# Patient Record
Sex: Female | Born: 1965 | Race: White | Hispanic: No | Smoking: Never smoker
Health system: Southern US, Community
[De-identification: ages and names within clinical notes are randomized; demographics above are authoritative.]

## PROBLEM LIST (undated history)

## (undated) DIAGNOSIS — Z92241 Personal history of systemic steroid therapy: Secondary | ICD-10-CM

## (undated) DIAGNOSIS — F32A Depression, unspecified: Secondary | ICD-10-CM

## (undated) DIAGNOSIS — M25559 Pain in unspecified hip: Secondary | ICD-10-CM

## (undated) DIAGNOSIS — D649 Anemia, unspecified: Secondary | ICD-10-CM

## (undated) DIAGNOSIS — M79606 Pain in leg, unspecified: Secondary | ICD-10-CM

## (undated) DIAGNOSIS — T7840XA Allergy, unspecified, initial encounter: Secondary | ICD-10-CM

## (undated) DIAGNOSIS — F3281 Premenstrual dysphoric disorder: Secondary | ICD-10-CM

## (undated) DIAGNOSIS — F329 Major depressive disorder, single episode, unspecified: Secondary | ICD-10-CM

## (undated) DIAGNOSIS — G2581 Restless legs syndrome: Secondary | ICD-10-CM

## (undated) DIAGNOSIS — G43909 Migraine, unspecified, not intractable, without status migrainosus: Secondary | ICD-10-CM

## (undated) DIAGNOSIS — G8929 Other chronic pain: Secondary | ICD-10-CM

## (undated) HISTORY — DX: Allergy, unspecified, initial encounter: T78.40XA

## (undated) HISTORY — DX: Pain in unspecified hip: M25.559

## (undated) HISTORY — DX: Restless legs syndrome: G25.81

## (undated) HISTORY — DX: Personal history of systemic steroid therapy: Z92.241

## (undated) HISTORY — DX: Depression, unspecified: F32.A

## (undated) HISTORY — DX: Other chronic pain: G89.29

## (undated) HISTORY — DX: Migraine, unspecified, not intractable, without status migrainosus: G43.909

## (undated) HISTORY — DX: Pain in leg, unspecified: M79.606

## (undated) HISTORY — DX: Anemia, unspecified: D64.9

## (undated) HISTORY — DX: Major depressive disorder, single episode, unspecified: F32.9

## (undated) HISTORY — DX: Premenstrual dysphoric disorder: F32.81

## (undated) HISTORY — PX: CERVICAL CERCLAGE: SHX1329

---

## 1997-12-30 ENCOUNTER — Inpatient Hospital Stay (HOSPITAL_COMMUNITY): Admission: AD | Admit: 1997-12-30 | Discharge: 1998-01-01 | Payer: Self-pay | Admitting: Obstetrics and Gynecology

## 1998-01-02 ENCOUNTER — Inpatient Hospital Stay (HOSPITAL_COMMUNITY): Admission: AD | Admit: 1998-01-02 | Discharge: 1998-01-02 | Payer: Self-pay | Admitting: Obstetrics & Gynecology

## 1998-12-28 ENCOUNTER — Ambulatory Visit (HOSPITAL_COMMUNITY): Admission: AD | Admit: 1998-12-28 | Discharge: 1998-12-28 | Payer: Self-pay | Admitting: Physician Assistant

## 1998-12-28 ENCOUNTER — Encounter: Payer: Self-pay | Admitting: Obstetrics and Gynecology

## 2000-02-22 ENCOUNTER — Inpatient Hospital Stay (HOSPITAL_COMMUNITY): Admission: AD | Admit: 2000-02-22 | Discharge: 2000-02-22 | Payer: Self-pay | Admitting: Obstetrics and Gynecology

## 2000-02-25 ENCOUNTER — Inpatient Hospital Stay (HOSPITAL_COMMUNITY): Admission: AD | Admit: 2000-02-25 | Discharge: 2000-02-25 | Payer: Self-pay | Admitting: *Deleted

## 2000-02-26 ENCOUNTER — Encounter: Payer: Self-pay | Admitting: Obstetrics and Gynecology

## 2000-03-01 ENCOUNTER — Inpatient Hospital Stay (HOSPITAL_COMMUNITY): Admission: AD | Admit: 2000-03-01 | Discharge: 2000-03-01 | Payer: Self-pay | Admitting: Obstetrics and Gynecology

## 2000-04-06 ENCOUNTER — Encounter: Payer: Self-pay | Admitting: Obstetrics and Gynecology

## 2000-04-06 ENCOUNTER — Inpatient Hospital Stay (HOSPITAL_COMMUNITY): Admission: RE | Admit: 2000-04-06 | Discharge: 2000-04-10 | Payer: Self-pay | Admitting: Obstetrics and Gynecology

## 2000-04-07 ENCOUNTER — Encounter: Payer: Self-pay | Admitting: Obstetrics and Gynecology

## 2000-04-08 ENCOUNTER — Encounter: Payer: Self-pay | Admitting: Obstetrics and Gynecology

## 2000-05-04 ENCOUNTER — Encounter (INDEPENDENT_AMBULATORY_CARE_PROVIDER_SITE_OTHER): Payer: Self-pay

## 2000-05-04 ENCOUNTER — Observation Stay (HOSPITAL_COMMUNITY): Admission: AD | Admit: 2000-05-04 | Discharge: 2000-05-05 | Payer: Self-pay | Admitting: Obstetrics and Gynecology

## 2000-05-04 ENCOUNTER — Encounter: Payer: Self-pay | Admitting: Obstetrics and Gynecology

## 2001-09-03 ENCOUNTER — Other Ambulatory Visit: Admission: RE | Admit: 2001-09-03 | Discharge: 2001-09-03 | Payer: Self-pay | Admitting: Gynecology

## 2002-01-31 ENCOUNTER — Emergency Department (HOSPITAL_COMMUNITY): Admission: EM | Admit: 2002-01-31 | Discharge: 2002-01-31 | Payer: Self-pay | Admitting: *Deleted

## 2002-10-20 ENCOUNTER — Other Ambulatory Visit: Admission: RE | Admit: 2002-10-20 | Discharge: 2002-10-20 | Payer: Self-pay | Admitting: Gynecology

## 2003-10-29 ENCOUNTER — Ambulatory Visit (HOSPITAL_COMMUNITY): Admission: RE | Admit: 2003-10-29 | Discharge: 2003-10-29 | Payer: Self-pay | Admitting: Gynecology

## 2003-10-29 ENCOUNTER — Encounter (INDEPENDENT_AMBULATORY_CARE_PROVIDER_SITE_OTHER): Payer: Self-pay | Admitting: *Deleted

## 2004-03-18 ENCOUNTER — Encounter (INDEPENDENT_AMBULATORY_CARE_PROVIDER_SITE_OTHER): Payer: Self-pay | Admitting: Specialist

## 2004-03-18 ENCOUNTER — Ambulatory Visit (HOSPITAL_COMMUNITY): Admission: AD | Admit: 2004-03-18 | Discharge: 2004-03-18 | Payer: Self-pay | Admitting: Gynecology

## 2004-10-22 ENCOUNTER — Emergency Department (HOSPITAL_COMMUNITY): Admission: EM | Admit: 2004-10-22 | Discharge: 2004-10-22 | Payer: Self-pay | Admitting: Emergency Medicine

## 2004-12-05 ENCOUNTER — Other Ambulatory Visit: Admission: RE | Admit: 2004-12-05 | Discharge: 2004-12-05 | Payer: Self-pay | Admitting: Gynecology

## 2005-01-02 LAB — MEASLES/MUMPS/RUBELLA IMMUNITY
MUMPS IGG: 2.54
RUBELLA: 60.1
RUBEOLA AB, IGG: 1.21

## 2005-01-02 LAB — MUMPS ANTIBODY, IGG: Mumps IgG: 2.54

## 2005-06-28 ENCOUNTER — Emergency Department (HOSPITAL_COMMUNITY): Admission: EM | Admit: 2005-06-28 | Discharge: 2005-06-28 | Payer: Self-pay | Admitting: Emergency Medicine

## 2006-01-03 ENCOUNTER — Other Ambulatory Visit: Admission: RE | Admit: 2006-01-03 | Discharge: 2006-01-03 | Payer: Self-pay | Admitting: Gynecology

## 2007-07-10 ENCOUNTER — Encounter: Admission: RE | Admit: 2007-07-10 | Discharge: 2007-07-10 | Payer: Self-pay | Admitting: Gynecology

## 2007-07-25 ENCOUNTER — Encounter: Admission: RE | Admit: 2007-07-25 | Discharge: 2007-07-25 | Payer: Self-pay | Admitting: Gynecology

## 2007-07-25 ENCOUNTER — Other Ambulatory Visit: Admission: RE | Admit: 2007-07-25 | Discharge: 2007-07-25 | Payer: Self-pay | Admitting: Gynecology

## 2008-07-27 ENCOUNTER — Other Ambulatory Visit: Admission: RE | Admit: 2008-07-27 | Discharge: 2008-07-27 | Payer: Self-pay | Admitting: Gynecology

## 2008-08-18 ENCOUNTER — Emergency Department (HOSPITAL_COMMUNITY): Admission: EM | Admit: 2008-08-18 | Discharge: 2008-08-18 | Payer: Self-pay | Admitting: Emergency Medicine

## 2008-09-02 ENCOUNTER — Encounter: Admission: RE | Admit: 2008-09-02 | Discharge: 2008-09-02 | Payer: Self-pay | Admitting: Gynecology

## 2009-12-15 ENCOUNTER — Ambulatory Visit: Payer: Self-pay | Admitting: Gynecology

## 2009-12-15 ENCOUNTER — Other Ambulatory Visit: Admission: RE | Admit: 2009-12-15 | Discharge: 2009-12-15 | Payer: Self-pay | Admitting: Gynecology

## 2009-12-20 ENCOUNTER — Encounter: Admission: RE | Admit: 2009-12-20 | Discharge: 2009-12-20 | Payer: Self-pay | Admitting: Gynecology

## 2010-12-19 ENCOUNTER — Encounter: Payer: Self-pay | Admitting: Gynecology

## 2011-02-16 ENCOUNTER — Other Ambulatory Visit: Payer: Self-pay | Admitting: Gynecology

## 2011-02-16 ENCOUNTER — Encounter (INDEPENDENT_AMBULATORY_CARE_PROVIDER_SITE_OTHER): Payer: Federal, State, Local not specified - PPO | Admitting: Gynecology

## 2011-02-16 ENCOUNTER — Other Ambulatory Visit (HOSPITAL_COMMUNITY)
Admission: RE | Admit: 2011-02-16 | Discharge: 2011-02-16 | Disposition: A | Discharge status: Home / Self Care | Payer: Federal, State, Local not specified - PPO | Source: Ambulatory Visit | Attending: Gynecology | Admitting: Gynecology

## 2011-02-16 DIAGNOSIS — Z833 Family history of diabetes mellitus: Secondary | ICD-10-CM

## 2011-02-16 DIAGNOSIS — Z124 Encounter for screening for malignant neoplasm of cervix: Secondary | ICD-10-CM | POA: Insufficient documentation

## 2011-02-16 DIAGNOSIS — Z01419 Encounter for gynecological examination (general) (routine) without abnormal findings: Secondary | ICD-10-CM

## 2011-02-16 DIAGNOSIS — B373 Candidiasis of vulva and vagina: Secondary | ICD-10-CM

## 2011-02-16 DIAGNOSIS — R635 Abnormal weight gain: Secondary | ICD-10-CM

## 2011-02-16 DIAGNOSIS — Z13220 Encounter for screening for lipoid disorders: Secondary | ICD-10-CM

## 2011-03-29 ENCOUNTER — Other Ambulatory Visit: Payer: Self-pay | Admitting: Gynecology

## 2011-03-29 DIAGNOSIS — Z1231 Encounter for screening mammogram for malignant neoplasm of breast: Secondary | ICD-10-CM

## 2011-03-31 ENCOUNTER — Ambulatory Visit: Payer: Federal, State, Local not specified - PPO

## 2011-04-05 ENCOUNTER — Ambulatory Visit: Payer: Federal, State, Local not specified - PPO

## 2011-04-07 ENCOUNTER — Ambulatory Visit: Payer: Federal, State, Local not specified - PPO

## 2011-04-14 NOTE — Op Note (Signed)
[facility]  Patient:    [name], Christine Frank                 MRN: [id number] Proc. Date: 04/09/00 Adm. Date:  [id number] Attending:  Madelyn Flavors                           Operative Report  PREOPERATIVE DIAGNOSIS:       Intrauterine pregnancy at 14-1/7 weeks. Incompetent cervix.  POSTOPERATIVE DIAGNOSIS:      Intrauterine pregnancy at 14-1/7 weeks. Incompetent cervix.  OPERATION:                    McDonald cervical cerclage.  SURGEON:                      Guy Sandifer. Arleta Creek, M.D.  ASSISTANT:  ANESTHESIA:                   Spinal by Raul Del, M.D.  ESTIMATED BLOOD LOSS:         15 cc.  INDICATIONS:                  This patient is a 45 year old married white female, gravida 5, para 2, abortus 2, who was admitted on Apr 06, 2000, for evaluation nd treatment of a possible incompetent cervix.  Details are dictated in the history and physical.  McDonald cerclage is discussed with the patient.  Possible risks and complications are discussed including, but not limited to, infection, rupture of membranes, all leading to pregnancy loss, bleeding, and transfusion, bladder perforation, and vesicovaginal fistula or rectovaginal fistula.  All questions re answered.  Consent is signed and on the chart.  DESCRIPTION OF PROCEDURE:     The patient is taken to the operating room where  spinal anesthetic is placed while the patient is in the lying down position. She is then placed in the dorsal lithotomy position where she is prepped, the bladder is straight catheterized, and she is draped in a sterile fashion.  General examination reveals the external cervical os to be 1 to 2 cm dilated.  The cervix is very soft and the lower segment is soft.  Weighted speculum was placed and the anterior retractor was placed.  0 Novafil suture is then placed beginning and ending at the 12 oclock position and a McDonald pursestring type cerclage  is placed.  Care is taken to get well into the substance of the cervix, but an effort is made to avoid passing the needletip through the cervical canal.  Minor bleeding is noted.  No leaking of fluid is noted.  A stitch is tied down and a second knot is tied above this level to facilitate in retrieving the suture later.  Good hemostasis is noted at that point.  All counts are correct.  The patient is transferred to the recovery room in stable condition. DD:  04/09/00 TD:  04/10/00 Job: [id number]

## 2011-04-14 NOTE — Discharge Summary (Signed)
[facility]  Patient:    Christine Frank, Christine Frank                 MRN: [id number] Adm. Date:  [id number] Disc. Date: [id number] Attending:  Madelyn Flavors Dictator:   Danie Chandler, R.N.                           Discharge Summary  ADMISSION DIAGNOSES:          1. Intrauterine pregnancy at 14-1/[redacted] weeks gestation.                               2. Incompetent cervix.  DISCHARGE DIAGNOSES:          1. Intrauterine pregnancy at 14-1/[redacted] weeks gestation.                               2. Incompetent cervix.  PROCEDURE:                    On Apr 10, 1999, McDonald cervical cerclage.  REASON FOR ADMISSION:         The patient is a 45 year old married white female, gravida 5, para 2, who was admitted on Apr 06, 2000, for evaluation and treatment of possible incompetent cervix.  Please see dictated history and physical for further information.  HOSPITAL COURSE:              The patient was taken to the operating room on Apr 09, 2000, for the placement of a McDonald cervical cerclage.  She tolerated this well and she was discharged home on Apr 10, 2000.  CONDITION ON DISCHARGE:       Good.  DIET:                         Regular as tolerated.  ACTIVITY:                     Bed rest.  FOLLOW-UP:                    She is to follow up in the office on Thursday. She is to call for any unusual pain, temperature, or bleeding.  DISCHARGE MEDICATIONS:        1. Prenatal vitamins one p.o. q.d.                               2. Amoxicillin 500 mg one three times a day x 5 days.                               3. Stool softener as needed.                               4. Tylenol as needed. DD:  04/27/00 TD:  05/01/00 Job: [id number]

## 2011-04-14 NOTE — H&P (Signed)
NAME:  Christine Frank, Christine Frank                    ACCOUNT NO.:  [id number]   MEDICAL RECORD NO.:  [id number]                   PATIENT TYPE:  AMB   LOCATION:  [facility]                                 FACILITY:  [facility]   PHYSICIAN:  Timothy P. Fontaine, M.D.           DATE OF BIRTH:  [birth year]   DATE OF ADMISSION:  03/18/2004  DATE OF DISCHARGE:                                HISTORY & PHYSICAL   CHIEF COMPLAINT:  Incomplete abortion.   HISTORY OF PRESENT ILLNESS:  A 45 year old G7 P2 female at approximately 7-[redacted]  weeks gestation who enters with profuse vaginal bleeding.  The patient was  evaluated in the office, was found to have a gestational sac that she  passed, but she continued to bleed heavily afterwards and is admitted now  for a suction D&C.   PAST MEDICAL HISTORY:  Uncomplicated.   PAST SURGICAL HISTORY:  Includes cesarean section x2, D&C, wisdom teeth,  cervical cerclage.   CURRENT MEDICATIONS:  Vitamins.   ALLERGIES:  SULFA DRUGS.   REVIEW OF SYSTEMS:  Noncontributory.   FAMILY HISTORY:  Noncontributory.   SOCIAL HISTORY:  Noncontributory.   ADMISSION PHYSICAL EXAMINATION:  VITAL SIGNS:  Afebrile, vital signs are  stable.  HEENT:  Normal.  LUNGS:  Clear.  CARDIAC:  Regular rate without rubs, murmurs, or gallops.  ABDOMEN:  Benign.  PELVIC:  External, BUS, vagina with heavy bleeding and clots.  Cervix  grossly closed.  Bimanual:  Uterus 7-8 weeks size, soft, adnexa without  gross mass or tenderness.   ASSESSMENT:  A 45 year old G7 P2 female at 7-[redacted] weeks gestation, spontaneous  incomplete abortion with passage of the sac but continued heavy bleeding,  for D&C.  I discussed the options with her and the risks to include  bleeding; transfusion; infection; uterine perforation; damage to internal  organs including bowel, bladder, ureters, vessels, and nerves necessitating  major exploratory reparative surgeries and future reparative surgeries - all  of which was understood  and accepted.                                               Timothy P. Audie Box, M.D.    TPF/MEDQ  D:  03/18/2004  T:  03/18/2004  Job:  [id number]

## 2011-04-14 NOTE — H&P (Signed)
NAME:  Christine Frank, Christine Frank                    ACCOUNT NO.:  [id number]   MEDICAL RECORD NO.:  [id number]                   PATIENT TYPE:  AMB   LOCATION:  [facility]                                  FACILITY:  [facility]   PHYSICIAN:  Timothy P. Fontaine, M.D.           DATE OF BIRTH:  12-31-1965   DATE OF ADMISSION:  10/29/2003  DATE OF DISCHARGE:                                HISTORY & PHYSICAL   CHIEF COMPLAINT:  Missed abortion.   HISTORY OF PRESENT ILLNESS:  A 44 year old G47, P2, AB3 female who presents  for ultrasound due to spawning at eight weeks gestation who was found to  have a missed AB.  Ultrasound shows a crown-rump length of eight weeks with  no cardiac activity and a large irregular yolk sac.  Options for management  were reviewed with the patient to include expectant management versus D&C.  The patient elects for suction D&C.   ALLERGIES:  No known drug allergies.   CURRENT MEDICATION:  Prenatal vitamins.   PAST SURGICAL HISTORY:  1. Cesarean section x2.  2. Cervical cerclage x1.   REVIEW OF SYMPTOMS:  Noncontributory.   SOCIAL HISTORY:  Noncontributory.   FAMILY HISTORY:  Noncontributory.   ADMISSION PHYSICAL EXAMINATION:  VITAL SIGNS:  Afebrile.  Vital signs are  stable.  HEENT:  Normal.  LUNGS:  Clear.  CARDIOVASCULAR:  Regular rate with no murmurs, rubs, or gallops.  ABDOMEN:  Benign.  PELVIC:  External BUS, vagina normal.  Cervix grossly normal.  Uterus is  eight weeks size, soft and nontender.  Adnexa without masses or tenderness.   ASSESSMENT:  A 45 year old missed abortion, eight weeks, for suction  dilatation and curettage.  The risks, benefits, indications and alternatives  for the procedure were reviewed with the patient to include the expectant  management versus suction D&C.  The patient elects for suction D&C.  I  reviewed with her what is involved with the procedure to include the  expected intraoperative and postoperative courses.  The risk of  bleeding,  transfusion, infection, uterine perforation, damage to internal organs  including bowel, bladder, ureters, vessels, and nerves  necessitating major exploratory reparative surgeries and future reparative  surgeries including ostomy formation was all discussed, understood and  accepted.  The patient wants to proceed with the procedure. She understands  and accepts these risks.  Her blood type is A positive.                                               Timothy P. Audie Box, M.D.    TPF/MEDQ  D:  10/28/2003  T:  10/28/2003  Job:  [id number]

## 2011-04-14 NOTE — Op Note (Signed)
NAME:  Christine Frank, Christine Frank                    ACCOUNT NO.:  [id number]   MEDICAL RECORD NO.:  [id number]                   PATIENT TYPE:  AMB   LOCATION:  [facility]                                 FACILITY:  [facility]   PHYSICIAN:  Timothy P. Fontaine, M.D.           DATE OF BIRTH:  1966/07/09   DATE OF PROCEDURE:  03/18/2004  DATE OF DISCHARGE:                                 OPERATIVE REPORT   PREOPERATIVE DIAGNOSES:  Incomplete abortion.   POSTOPERATIVE DIAGNOSES:  Incomplete abortion.   PROCEDURE:  Suction D&C.   SURGEON:  Timothy P. Fontaine, M.D.   ANESTHESIA:  MAC paracervical block 1% lidocaine.   ESTIMATED BLOOD LOSS:  20 mL.   INTRAOPERATIVE COMPLICATIONS:  None.   SPECIMENS:  POC.   FINDINGS:  Anteverted 6-8 week uterus soft, adnexa without masses.  Cervix  was grossly closed, POC retrieved grossly, #7 curette used.   DESCRIPTION OF PROCEDURE:  The patient was taken in the operating room,  underwent an inhalational anesthesia, placed in low dorsal lithotomy  position, received a perineal vaginal preparation with Betadine solution.  The bladder emptied with in and out Foley catheterization, EUA performed,  the patient draped in the usual fashion. The cervix was visualized with a  speculum, anterior lip grasped with a single tooth tenaculum and a  paracervical block using 1% lidocaine was placed at a total of 10 mL.  Clots  were evacuated from the upper vagina and upon initial dilatation, the cervix  was found to be dilated and spontaneously admitted a #7 suction curette.  The suction curettage was then performed with gross POC retrieved.  A gentle  sharp curettage was performed. The instruments were removed, adequate  hemostasis visualized, the patient placed in the supine position and was  taken to the recovery room in good condition having tolerated the procedure  well. There was no evidence of perforation throughout the case.  The  patient's blood type is A  positive.                                               Timothy P. Audie Box, M.D.    TPF/MEDQ  D:  03/18/2004  T:  03/18/2004  Job:  [id number]

## 2011-04-14 NOTE — Op Note (Signed)
NAME:  Christine Frank, Christine Frank                    ACCOUNT NO.:  [id number]   MEDICAL RECORD NO.:  [id number]                   PATIENT TYPE:  AMB   LOCATION:  [facility]                                  [facility]AC[facility]   PHYSICIAN:  Timothy P. Fontaine, M.D.           DATE OF BIRTH:  25-Oct-1966   DATE OF PROCEDURE:  10/29/2003  DATE OF DISCHARGE:                                 OPERATIVE REPORT   PREOPERATIVE DIAGNOSIS:  Missed abortion.   POSTOPERATIVE DIAGNOSIS:  Missed abortion.   PROCEDURE:  Suction D&C.   SURGEON:  Timothy P. Fontaine, M.D.   ANESTHESIA:  MAC 1% lidocaine pericervical block.   SPECIMEN:  POC.   FINDINGS:  Uterus 8 weeks size anteverted.  Gross POC retrieved, #8 suction  curette used.   PROCEDURE:  The patient was taken to the operating room and underwent IV  sedation.  She was placed in the low dorsal lithotomy position.  She  received a perineal vaginal preparation with Betadine solution.  The bladder  was emptied with in and out flow catheterization.  UA was performed and the  patient was draped in the usual fashion.  The cervix was visualized with a  speculum.  The anterior was grasped with a single-toothed tenaculum and a  pericervical block using 1% lidocaine was placed, a total 10 cc.  The cervix  was then gently and gradually dilated to admit a #8 suction curette and a  suction curettage was performed without difficulty with gross POC retrieved.  A gentle sharp curettage was performed to assure complete emptying.  The  instruments were removed, adequate hemostasis visualized.  The patient was  placed in the supine position and taken to the recovery room in good  position, having tolerated the procedure well.                                               Timothy P. Audie Box, M.D.    TPF/MEDQ  D:  10/29/2003  T:  10/29/2003  Job:  [id number]

## 2012-02-20 ENCOUNTER — Ambulatory Visit
Admission: RE | Admit: 2012-02-20 | Discharge: 2012-02-20 | Disposition: A | Discharge status: Home / Self Care | Payer: Federal, State, Local not specified - PPO | Source: Ambulatory Visit | Attending: Gynecology | Admitting: Gynecology

## 2012-02-20 DIAGNOSIS — Z1231 Encounter for screening mammogram for malignant neoplasm of breast: Secondary | ICD-10-CM

## 2012-02-21 ENCOUNTER — Ambulatory Visit: Payer: Federal, State, Local not specified - PPO

## 2012-03-01 ENCOUNTER — Encounter: Payer: Federal, State, Local not specified - PPO | Admitting: Gynecology

## 2012-03-01 ENCOUNTER — Other Ambulatory Visit: Payer: Self-pay | Admitting: Gynecology

## 2012-03-26 ENCOUNTER — Encounter: Payer: Self-pay | Admitting: Gynecology

## 2012-04-01 ENCOUNTER — Encounter: Payer: Self-pay | Admitting: Gynecology

## 2012-04-01 ENCOUNTER — Ambulatory Visit (INDEPENDENT_AMBULATORY_CARE_PROVIDER_SITE_OTHER): Payer: Federal, State, Local not specified - PPO | Admitting: Gynecology

## 2012-04-01 VITALS — BP 120/76 | Ht 64.0 in | Wt 165.0 lb

## 2012-04-01 DIAGNOSIS — Z13220 Encounter for screening for lipoid disorders: Secondary | ICD-10-CM

## 2012-04-01 DIAGNOSIS — Z131 Encounter for screening for diabetes mellitus: Secondary | ICD-10-CM

## 2012-04-01 DIAGNOSIS — Z01419 Encounter for gynecological examination (general) (routine) without abnormal findings: Secondary | ICD-10-CM

## 2012-04-01 DIAGNOSIS — B373 Candidiasis of vulva and vagina: Secondary | ICD-10-CM

## 2012-04-01 DIAGNOSIS — N898 Other specified noninflammatory disorders of vagina: Secondary | ICD-10-CM

## 2012-04-01 DIAGNOSIS — R5383 Other fatigue: Secondary | ICD-10-CM

## 2012-04-01 DIAGNOSIS — R5381 Other malaise: Secondary | ICD-10-CM

## 2012-04-01 LAB — LIPID PANEL
HDL: 52 mg/dL (ref >39)
LDL Cholesterol: 115 mg/dL — ABNORMAL HIGH (ref 0–99)
VLDL: 18 mg/dL (ref 0–40)

## 2012-04-01 LAB — CBC WITH DIFFERENTIAL/PLATELET
Basophils Absolute: 0 10*3/uL (ref 0.0–0.1)
Eosinophils Absolute: 0.2 10*3/uL (ref 0.0–0.7)
Eosinophils Relative: 2 % (ref 0–5)
HCT: 41.7 % (ref 36.0–46.0)
MCH: 30.5 pg (ref 26.0–34.0)
MCV: 90.8 fL (ref 78.0–100.0)
Monocytes Absolute: 0.7 10*3/uL (ref 0.1–1.0)
Platelets: 364 10*3/uL (ref 150–400)
RDW: 12.5 % (ref 11.5–15.5)

## 2012-04-01 LAB — WET PREP FOR TRICH, YEAST, CLUE
Clue Cells Wet Prep HPF POC: NONE SEEN
WBC, Wet Prep HPF POC: NONE SEEN

## 2012-04-01 LAB — TSH: TSH: 1.349 u[IU]/mL (ref 0.350–4.500)

## 2012-04-01 MED ORDER — FLUCONAZOLE 150 MG PO TABS: 150.0000 mg | ORAL_TABLET | Freq: Once | ORAL | Status: AC

## 2012-04-01 MED ORDER — ESCITALOPRAM OXALATE 10 MG PO TABS: 10.0000 mg | ORAL_TABLET | Freq: Every day | ORAL | Status: DC

## 2012-04-01 NOTE — Progress Notes (Signed)
Christine Frank 06-07-1966 [id number]        46 y.o.  for annual exam.  Several issues noted below.  Past medical history,surgical history, medications, allergies, family history and social history were all reviewed and documented in the EPIC chart. ROS:  Was performed and pertinent positives and negatives are included in the history.  Exam: Kim chaperone present Filed Vitals:   04/01/12 1002  BP: 120/76   General appearance  Normal Skin grossly normal Head/Neck normal with no cervical or supraclavicular adenopathy thyroid normal Lungs  clear Cardiac RR, without RMG Abdominal  soft, nontender, without masses, organomegaly or hernia Breasts  examined lying and sitting without masses, retractions, discharge or axillary adenopathy. Pelvic  Ext/BUS/vagina  normal with white discharge  Cervix  normal   Uterus  anteverted, normal size, shape and contour, midline and mobile nontender   Adnexa  Without masses or tenderness    Anus and perineum  normal   Rectovaginal  normal sphincter tone without palpated masses or tenderness.    Assessment/Plan:  46 y.o. female for annual exam.    1. White discharge. Onset of discharge with some itching following antibiotic use. Wet prep is positive for yeast. We'll treat with Diflucan 150x1 dose. 2 refills given to use as needed. 2. Contraception.  She continues to use condoms. I again reviewed with her the failure risk with these and alternatives. Patient is comfortable continuing and she accepts the risk of failure. Availability of Plan B was reviewed with her. 3. Mammography. Patient had her mammogram in March 2013. She'll continue with annual mammography. SBE monthly reviewed. 4. Pap smear. Patient has no history of abnormal Pap smears. Her last Pap smear was 2012. I reviewed current screening guidelines. She has numerous normal reports in her chart and no Pap smear was done today. We'll plan on every 3-5 year intervals. 5. Fatigue. Patient  continues to complain of fatigue despite exercise. She has lost 10 pounds and although she's feeling better still notes some fatigue. I went head and checked a TSH and vitamin B 12 at her request.  Assuming normal then she'll continue on her exercise/diet program.  6. Anxiety. Patient's doing well on Lexapro 10 mg daily and I refilled her times a year at her request. 7. Health maintenance. Baseline CBC urinalysis lipid profile and glucose were ordered. Assuming she continues well from a gynecologic standpoint and she will see me in a year, sooner as needed.    Dara Lords MD, 11:33 AM 04/01/2012

## 2012-04-02 LAB — URINALYSIS W MICROSCOPIC + REFLEX CULTURE
Bacteria, UA: NONE SEEN
Casts: NONE SEEN
Hgb urine dipstick: NEGATIVE
Ketones, ur: NEGATIVE mg/dL
Leukocytes, UA: NEGATIVE
Nitrite: NEGATIVE
Specific Gravity, Urine: 1.006 (ref 1.005–1.030)
Urobilinogen, UA: 0.2 mg/dL (ref 0.0–1.0)
pH: 7 (ref 5.0–8.0)

## 2012-08-26 ENCOUNTER — Telehealth: Payer: Self-pay | Admitting: *Deleted

## 2012-08-26 MED ORDER — CIPROFLOXACIN HCL 250 MG PO TABS: 250.0000 mg | ORAL_TABLET | Freq: Two times a day (BID) | ORAL | Status: DC

## 2012-08-26 MED ORDER — FLUCONAZOLE 150 MG PO TABS: 150.0000 mg | ORAL_TABLET | Freq: Once | ORAL | Status: DC

## 2012-08-26 NOTE — Telephone Encounter (Signed)
Informed KW 

## 2012-08-26 NOTE — Telephone Encounter (Signed)
Pt called with UTI sx's. Very uncomfortable with  Urination with frequency and urgency. She requests a RX for UTI and then requests a Diflucan to take post ABX. 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 Please advise if you can give RX.

## 2012-08-26 NOTE — Telephone Encounter (Signed)
Ciprofloxacin 250 twice a day x3 days, Diflucan 150x1 office visit if symptoms persist.

## 2013-03-17 ENCOUNTER — Other Ambulatory Visit: Payer: Self-pay

## 2013-03-17 DIAGNOSIS — Z1231 Encounter for screening mammogram for malignant neoplasm of breast: Secondary | ICD-10-CM

## 2013-03-28 ENCOUNTER — Encounter: Payer: Self-pay | Admitting: Surgery

## 2013-03-31 ENCOUNTER — Encounter: Payer: Self-pay | Admitting: Vascular Surgery

## 2013-03-31 ENCOUNTER — Ambulatory Visit (INDEPENDENT_AMBULATORY_CARE_PROVIDER_SITE_OTHER): Payer: Federal, State, Local not specified - PPO | Admitting: Vascular Surgery

## 2013-03-31 ENCOUNTER — Encounter (INDEPENDENT_AMBULATORY_CARE_PROVIDER_SITE_OTHER): Payer: Federal, State, Local not specified - PPO | Admitting: *Deleted

## 2013-03-31 VITALS — BP 99/65 | HR 80 | Resp 16 | Ht 65.0 in | Wt 150.0 lb

## 2013-03-31 DIAGNOSIS — I83893 Varicose veins of bilateral lower extremities with other complications: Secondary | ICD-10-CM

## 2013-03-31 DIAGNOSIS — Z01810 Encounter for preprocedural cardiovascular examination: Secondary | ICD-10-CM

## 2013-03-31 NOTE — Progress Notes (Signed)
Subjective:     Patient ID: Christine Frank, female   DOB: [birth year], 47 y.o.   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 She has had spider veins for many years and a bulge in the left calf occurred following her second pregnancy 15 years ago. She has no history of DVT and thrombophlebitis. She does not wear elastic compression stockings. She has had increasing symptoms inner thighs and calves associated with the spider and reticular veins such as hypersensitivity aching throbbing burning and stinging discomfort. She has no distal edema.  History reviewed. No pertinent past medical history.  History  Substance Use Topics  . Smoking status: Never Smoker   . Smokeless tobacco: Not on file  . Alcohol Use: 0.5 oz/week    1 drink(s) per week    Family History  Problem Relation Age of Onset  . Hypertension Father   . Diabetes Father     Allergies  Allergen Reactions  . Sulfa Antibiotics     Current outpatient prescriptions:ciprofloxacin (CIPRO) 250 MG tablet, Take 1 tablet (250 mg total) by mouth 2 (two) times daily. For 3 days, Disp: 6 tablet, Rfl: 0;  fluconazole (DIFLUCAN) 150 MG tablet, Take 1 tablet (150 mg total) by mouth once., Disp: 1 tablet, Rfl: 0;  fluticasone (FLONASE) 50 MCG/ACT nasal spray, Place 2 sprays into the nose daily., Disp: , Rfl:  Multiple Vitamin (MULTIVITAMIN) capsule, Take 1 capsule by mouth daily., Disp: , Rfl: ;  escitalopram (LEXAPRO) 10 MG tablet, Take 1 tablet (10 mg total) by mouth daily., Disp: 30 tablet, Rfl: 11  BP 99/65  Pulse 80  Resp 16  Ht 5\' 5"  (1.651 m)  Wt 150 lb (68.04 kg)  BMI 24.96 kg/m2  Body mass index is 24.96 kg/(m^2).          Review of Systems complains of pain in legs with walking, pain in legs while lying flat, all other systems negative and a complete review of systems     Objective:   Physical Exam blood pressure  900/65 heart rate 80 respirations 16 Gen.-alert and oriented x3 in no apparent distress HEENT normal for age Lungs no rhonchi or wheezing Cardiovascular regular rhythm no murmurs carotid pulses 3+ palpable no bruits audible Abdomen soft nontender no palpable masses Musculoskeletal free of  major deformities Skin clear -no rashes Neurologic normal Lower extremities 3+ femoral and dorsalis pedis pulses palpable bilaterally with no edema Both lower extremities have extensive spider veins in the lateral anterior thighs as well as the popliteal fossa. The left calf has a prominent reticular vein in the midcalf area posteriorly. There is no hyperpigmentation ulceration or distal edema in either lower extremity.  Backordered bilateral venous duplex exam which I reviewed and interpreted. There is no DVT in either leg. There are areas of reflux in both great saphenous veins but the veins are small and I did not have reflux throughout.      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 Will discuss this with her and proceed as she desires

## 2013-04-02 ENCOUNTER — Ambulatory Visit (INDEPENDENT_AMBULATORY_CARE_PROVIDER_SITE_OTHER): Payer: Federal, State, Local not specified - PPO | Admitting: Gynecology

## 2013-04-02 ENCOUNTER — Encounter: Payer: Self-pay | Admitting: Gynecology

## 2013-04-02 VITALS — BP 112/66 | Ht 64.0 in | Wt 155.0 lb

## 2013-04-02 DIAGNOSIS — Z309 Encounter for contraceptive management, unspecified: Secondary | ICD-10-CM

## 2013-04-02 DIAGNOSIS — R51 Headache: Secondary | ICD-10-CM

## 2013-04-02 DIAGNOSIS — Z13220 Encounter for screening for lipoid disorders: Secondary | ICD-10-CM

## 2013-04-02 DIAGNOSIS — Z01419 Encounter for gynecological examination (general) (routine) without abnormal findings: Secondary | ICD-10-CM

## 2013-04-02 DIAGNOSIS — F411 Generalized anxiety disorder: Secondary | ICD-10-CM

## 2013-04-02 DIAGNOSIS — R519 Headache, unspecified: Secondary | ICD-10-CM

## 2013-04-02 LAB — URINALYSIS W MICROSCOPIC + REFLEX CULTURE
Bacteria, UA: NONE SEEN
Bilirubin Urine: NEGATIVE
Ketones, ur: NEGATIVE mg/dL
Nitrite: NEGATIVE
Protein, ur: NEGATIVE mg/dL
Squamous Epithelial / LPF: NONE SEEN
Urobilinogen, UA: 0.2 mg/dL (ref 0.0–1.0)

## 2013-04-02 LAB — CBC WITH DIFFERENTIAL/PLATELET
Basophils Absolute: 0 10*3/uL (ref 0.0–0.1)
Basophils Relative: 0 % (ref 0–1)
Eosinophils Absolute: 0.2 10*3/uL (ref 0.0–0.7)
Eosinophils Relative: 2 % (ref 0–5)
Lymphs Abs: 1.8 10*3/uL (ref 0.7–4.0)
MCH: 30.1 pg (ref 26.0–34.0)
MCHC: 33.9 g/dL (ref 30.0–36.0)
MCV: 88.9 fL (ref 78.0–100.0)
Neutrophils Relative %: 67 % (ref 43–77)
Platelets: 331 10*3/uL (ref 150–400)
RBC: 4.25 MIL/uL (ref 3.87–5.11)
RDW: 13.6 % (ref 11.5–15.5)

## 2013-04-02 LAB — LIPID PANEL
Cholesterol: 153 mg/dL (ref 0–200)
LDL Cholesterol: 84 mg/dL (ref 0–99)
Total CHOL/HDL Ratio: 2.7 Ratio
VLDL: 13 mg/dL (ref 0–40)

## 2013-04-02 LAB — COMPREHENSIVE METABOLIC PANEL
ALT: 9 U/L (ref 0–35)
Alkaline Phosphatase: 41 U/L (ref 39–117)
CO2: 25 mEq/L (ref 19–32)
Creat: 0.75 mg/dL (ref 0.50–1.10)
Sodium: 139 mEq/L (ref 135–145)
Total Bilirubin: 0.7 mg/dL (ref 0.3–1.2)
Total Protein: 6.7 g/dL (ref 6.0–8.3)

## 2013-04-02 MED ORDER — ESCITALOPRAM OXALATE 10 MG PO TABS: 10.0000 mg | ORAL_TABLET | Freq: Every day | ORAL | Status: DC

## 2013-04-02 NOTE — Patient Instructions (Addendum)
Call in 2 months to let me know how you're doing with the headaches. Followup for annual exam in one year

## 2013-04-02 NOTE — Progress Notes (Signed)
Christine Frank 09-26-1966 [id number]        47 y.o.  [id number] for annual exam.  Several issues noted below.  Past medical history,surgical history, medications, allergies, family history and social history were all reviewed and documented in the EPIC chart. ROS:  Was performed and pertinent positives and negatives are included in the history.  Exam:  Kim assistant Filed Vitals:   04/02/13 0921  BP: 112/66  Height: 5\' 4"  (1.626 m)  Weight: 155 lb (70.308 kg)   General appearance  Normal Skin grossly normal Head/Neck normal with no cervical or supraclavicular adenopathy thyroid normal Lungs  clear Cardiac RR, without RMG Abdominal  soft, nontender, without masses, organomegaly or hernia Breasts  examined lying and sitting without masses, retractions, discharge or axillary adenopathy. Pelvic  Ext/BUS/vagina  normal   Cervix  normal   Uterus  anteverted, normal size, shape and contour, midline and mobile nontender   Adnexa  Without masses or tenderness    Anus and perineum  normal   Rectovaginal  normal sphincter tone without palpated masses or tenderness.    Assessment/Plan:  47 y.o. [id number] female for annual exam, barrier contraception, regular menses.   1. Premenstrual headaches. Patient has developed premenstrual headaches will come anywhere from a day to a week before her menses. Lasts up to a day despite OTC pain medication. No aura /neurologic symptoms. Reviewed options to include pain medication, neurologist referral or trial of premenstrual estrogen. I suspect that these are estrogen withdrawal stimulated. Patient wants to try the estrogen and I gave her samples of Minivelle 0.1 mg patches and she'll apply one week prior to her menses and then stopped with her menses. A 2 month sample given. I reviewed the risks of estrogen to include increased risk of stroke heart attack DVT. Also the fact that she would be pregnant and not know it the exposure risk/teratogenetic risk for  that week discussed. Patient will call in followup after trying for 2 months and we'll go from there. 2. Contraceptive management. I again discussed the failure risk of barrier method. Availability of plan B. alternatives to include pill patch ring Depo-Provera Implanon IUD and sterilization all discussed. Patient declines at present. 3. Mammography 2013. She hasn't scheduled this month and will followup for this. SBE monthly reviewed. 4. Pap smear 2012. No Pap smear done today. No history of abnormal Pap smears previously. Plan repeat next year 3 year interval. 5. Anxiety. Patient on Lexapro 10 mg for anxiety and doing well with this and wants to continue and I refilled her x1 year. 6. Health maintenance. Baseline CBC comprehensive metabolic panel lipid profile urinalysis ordered. Followup by phone in 2 months how she's doing with the headaches. Otherwise one year for annual exam.    Dara Lords MD, 9:56 AM 04/02/2013

## 2013-04-08 NOTE — Addendum Note (Signed)
Addended by: Sharee Pimple on: 04/08/2013 02:12 PM   Modules accepted: Orders

## 2013-04-11 ENCOUNTER — Encounter: Payer: Self-pay | Admitting: Gynecology

## 2013-04-14 ENCOUNTER — Ambulatory Visit
Admission: RE | Admit: 2013-04-14 | Discharge: 2013-04-14 | Disposition: A | Discharge status: Home / Self Care | Payer: Federal, State, Local not specified - PPO | Source: Ambulatory Visit

## 2013-04-14 DIAGNOSIS — Z1231 Encounter for screening mammogram for malignant neoplasm of breast: Secondary | ICD-10-CM

## 2013-04-23 ENCOUNTER — Ambulatory Visit: Payer: Federal, State, Local not specified - PPO | Admitting: *Deleted

## 2013-05-06 ENCOUNTER — Other Ambulatory Visit: Payer: Self-pay | Admitting: Gynecology

## 2013-08-28 ENCOUNTER — Other Ambulatory Visit: Payer: Self-pay | Admitting: Gynecology

## 2014-06-02 ENCOUNTER — Other Ambulatory Visit: Payer: Self-pay | Admitting: Gynecology

## 2014-06-10 ENCOUNTER — Other Ambulatory Visit: Payer: Self-pay | Admitting: Gynecology

## 2014-06-24 ENCOUNTER — Other Ambulatory Visit: Payer: Self-pay

## 2014-06-24 DIAGNOSIS — Z1231 Encounter for screening mammogram for malignant neoplasm of breast: Secondary | ICD-10-CM

## 2014-07-10 ENCOUNTER — Ambulatory Visit: Payer: Federal, State, Local not specified - PPO

## 2014-07-14 ENCOUNTER — Ambulatory Visit: Payer: Federal, State, Local not specified - PPO

## 2014-07-17 ENCOUNTER — Encounter: Payer: Federal, State, Local not specified - PPO | Admitting: Gynecology

## 2014-07-23 ENCOUNTER — Ambulatory Visit: Payer: Federal, State, Local not specified - PPO

## 2014-07-28 ENCOUNTER — Ambulatory Visit: Payer: Federal, State, Local not specified - PPO

## 2014-08-31 ENCOUNTER — Ambulatory Visit (INDEPENDENT_AMBULATORY_CARE_PROVIDER_SITE_OTHER): Payer: Federal, State, Local not specified - PPO | Admitting: Gynecology

## 2014-08-31 ENCOUNTER — Encounter: Payer: Self-pay | Admitting: Gynecology

## 2014-08-31 ENCOUNTER — Other Ambulatory Visit (HOSPITAL_COMMUNITY)
Admission: RE | Admit: 2014-08-31 | Discharge: 2014-08-31 | Disposition: A | Discharge status: Home / Self Care | Payer: Federal, State, Local not specified - PPO | Source: Ambulatory Visit | Attending: Gynecology | Admitting: Gynecology

## 2014-08-31 VITALS — BP 120/76 | Ht 64.0 in | Wt 166.0 lb

## 2014-08-31 DIAGNOSIS — Z01411 Encounter for gynecological examination (general) (routine) with abnormal findings: Secondary | ICD-10-CM | POA: Insufficient documentation

## 2014-08-31 DIAGNOSIS — Z1151 Encounter for screening for human papillomavirus (HPV): Secondary | ICD-10-CM | POA: Diagnosis present

## 2014-08-31 DIAGNOSIS — Z124 Encounter for screening for malignant neoplasm of cervix: Secondary | ICD-10-CM

## 2014-08-31 DIAGNOSIS — F411 Generalized anxiety disorder: Secondary | ICD-10-CM

## 2014-08-31 DIAGNOSIS — Z01419 Encounter for gynecological examination (general) (routine) without abnormal findings: Secondary | ICD-10-CM

## 2014-08-31 NOTE — Progress Notes (Signed)
GENIENE LIST [birth year] [id number]        48 y.o.  [id number] for annual exam.  Several issues noted below.  Past medical history,surgical history, problem list, medications, allergies, family history and social history were all reviewed and documented as reviewed in the EPIC chart.  ROS:  12 system ROS performed with pertinent positives and negatives included in the history, assessment and plan.   Additional significant findings :  none   Exam: Christine Frank Vitals:   08/31/14 1438  BP: 120/76  Height: 5\' 4"  (1.626 m)  Weight: 166 lb (75.297 kg)   General appearance:  Normal affect, orientation and appearance. Skin: Grossly normal HEENT: Without gross lesions.  No cervical or supraclavicular adenopathy. Thyroid normal.  Lungs:  Clear without wheezing, rales or rhonchi Cardiac: RR, without RMG Abdominal:  Soft, nontender, without masses, guarding, rebound, organomegaly or hernia Breasts:  Examined lying and sitting without masses, retractions, discharge or axillary adenopathy. Pelvic:  Ext/BUS/vagina normal  Cervix normal. Pap done  Uterus introverted, normal size, shape and contour, midline and mobile nontender   Adnexa  Without masses or tenderness    Anus and perineum  Normal   Rectovaginal  Normal sphincter tone without palpated masses or tenderness.    Assessment/Plan:  48 y.o. Christine Frank female for annual exam with regular menses, condom contraception.   1. Contraception. I again reviewed failure risks with condoms. Availability of plan B. Reviewed. Alternatives offered but declined. 2. Mild menstrual irregularity.menses are starting to come earlier or later during the month but still remain monthly. Normal flow and may occur. Consistent with a perimenopausal pattern. Will check TSH and continue monitoring at present. Options for menstrual regulation to include low-dose oral contraceptives reviewed. She does have a history of migraine headaches with occasional  aura. Issues of increased risk of stroke discussed. At this point she is not interested in trying a low-dose oral contraceptive. 3. Premenstrual headaches. Continues with premenstrual headaches. Initially tried premenstrual estrogen patch as described last year but discontinued because of the menstrual regularity making it difficult to time. Not interested in trying at this point. 4. PMDD. Had been on Lexapro 10 mg daily overall doing well. Is noting difficulty losing weight and wonders whether related to the Lexapro. Options reviewed to include stopping her Lexapro now seeing how she does. Patient wants to go ahead and try this and will wean herself off the Lexapro. He'll call me if she has any issues after doing so. Will check a TSH reference to the weight issue. To continue with exercise and dietary modification. 5. Pap smear 2012. Pap/HPV done today.  No history of significant abnormal Pap smears previously. Assuming Pap smear negative then plan repeat at 3-5 your interval progress screening guidelines. 6. Mammography 03/2013. Patient knows she is overdue and promises to schedule. SBE monthly reviewed.  7. Health maintenance.  Baseline CBC comprehensive metabolic panel lipid profile TSH urinalysis ordered. Follow up in one year, sooner as needed.     Christine Auerbach MD, 3:13 PM 08/31/2014

## 2014-08-31 NOTE — Addendum Note (Signed)
Addended by: Nelva Nay on: 08/31/2014 03:23 PM   Modules accepted: Orders

## 2014-08-31 NOTE — Patient Instructions (Signed)
Make sure you schedule a follow up for your mammogram.  Wean off of the Lexapro and see how we do as far as premenstrual symptoms and weight.  You may obtain a copy of any labs that were done today by logging onto MyChart as outlined in the instructions provided with your AVS (after visit summary). The office will not call with normal lab results but certainly if there are any significant abnormalities then we will contact you.   Health Maintenance, Female A healthy lifestyle and preventative care can promote health and wellness.  Maintain regular health, dental, and eye exams.  Eat a healthy diet. Foods like vegetables, fruits, whole grains, low-fat dairy products, and lean protein foods contain the nutrients you need without too many calories. Decrease your intake of foods high in solid fats, added sugars, and salt. Get information about a proper diet from your caregiver, if necessary.  Regular physical exercise is one of the most important things you can do for your health. Most adults should get at least 150 minutes of moderate-intensity exercise (any activity that increases your heart rate and causes you to sweat) each week. In addition, most adults need muscle-strengthening exercises on 2 or more days a week.   Maintain a healthy weight. The body mass index (BMI) is a screening tool to identify possible weight problems. It provides an estimate of body fat based on height and weight. Your caregiver can help determine your BMI, and can help you achieve or maintain a healthy weight. For adults 20 years and older:  A BMI below 18.5 is considered underweight.  A BMI of 18.5 to 24.9 is normal.  A BMI of 25 to 29.9 is considered overweight.  A BMI of 30 and above is considered obese.  Maintain normal blood lipids and cholesterol by exercising and minimizing your intake of saturated fat. Eat a balanced diet with plenty of fruits and vegetables. Blood tests for lipids and cholesterol should  begin at age 83 and be repeated every 5 years. If your lipid or cholesterol levels are high, you are over 50, or you are a high risk for heart disease, you may need your cholesterol levels checked more frequently.Ongoing high lipid and cholesterol levels should be treated with medicines if diet and exercise are not effective.  If you smoke, find out from your caregiver how to quit. If you do not use tobacco, do not start.  Lung cancer screening is recommended for adults aged 59 80 years who are at high risk for developing lung cancer because of a history of smoking. Yearly low-dose computed tomography (CT) is recommended for people who have at least a 30-pack-year history of smoking and are a current smoker or have quit within the past 15 years. A pack year of smoking is smoking an average of 1 pack of cigarettes a day for 1 year (for example: 1 pack a day for 30 years or 2 packs a day for 15 years). Yearly screening should continue until the smoker has stopped smoking for at least 15 years. Yearly screening should also be stopped for people who develop a health problem that would prevent them from having lung cancer treatment.  If you are pregnant, do not drink alcohol. If you are breastfeeding, be very cautious about drinking alcohol. If you are not pregnant and choose to drink alcohol, do not exceed 1 drink per day. One drink is considered to be 12 ounces (355 mL) of beer, 5 ounces (148 mL) of wine, or  1.5 ounces (44 mL) of liquor.  Avoid use of street drugs. Do not share needles with anyone. Ask for help if you need support or instructions about stopping the use of drugs.  High blood pressure causes heart disease and increases the risk of stroke. Blood pressure should be checked at least every 1 to 2 years. Ongoing high blood pressure should be treated with medicines, if weight loss and exercise are not effective.  If you are 78 to 48 years old, ask your caregiver if you should take aspirin to  prevent strokes.  Diabetes screening involves taking a blood sample to check your fasting blood sugar level. This should be done once every 3 years, after age 9, if you are within normal weight and without risk factors for diabetes. Testing should be considered at a younger age or be carried out more frequently if you are overweight and have at least 1 risk factor for diabetes.  Breast cancer screening is essential preventative care for women. You should practice "breast self-awareness." This means understanding the normal appearance and feel of your breasts and may include breast self-examination. Any changes detected, no matter how small, should be reported to a caregiver. Women in their 21s and 30s should have a clinical breast exam (CBE) by a caregiver as part of a regular health exam every 1 to 3 years. After age 27, women should have a CBE every year. Starting at age 39, women should consider having a mammogram (breast X-ray) every year. Women who have a family history of breast cancer should talk to their caregiver about genetic screening. Women at a high risk of breast cancer should talk to their caregiver about having an MRI and a mammogram every year.  Breast cancer gene (BRCA)-related cancer risk assessment is recommended for women who have family members with BRCA-related cancers. BRCA-related cancers include breast, ovarian, tubal, and peritoneal cancers. Having family members with these cancers may be associated with an increased risk for harmful changes (mutations) in the breast cancer genes BRCA1 and BRCA2. Results of the assessment will determine the need for genetic counseling and BRCA1 and BRCA2 testing.  The Pap test is a screening test for cervical cancer. Women should have a Pap test starting at age 74. Between ages 73 and 80, Pap tests should be repeated every 2 years. Beginning at age 28, you should have a Pap test every 3 years as long as the past 3 Pap tests have been normal. If  you had a hysterectomy for a problem that was not cancer or a condition that could lead to cancer, then you no longer need Pap tests. If you are between ages 13 and 70, and you have had normal Pap tests going back 10 years, you no longer need Pap tests. If you have had past treatment for cervical cancer or a condition that could lead to cancer, you need Pap tests and screening for cancer for at least 20 years after your treatment. If Pap tests have been discontinued, risk factors (such as a new sexual partner) need to be reassessed to determine if screening should be resumed. Some women have medical problems that increase the chance of getting cervical cancer. In these cases, your caregiver may recommend more frequent screening and Pap tests.  The human papillomavirus (HPV) test is an additional test that may be used for cervical cancer screening. The HPV test looks for the virus that can cause the cell changes on the cervix. The cells collected during the Pap  test can be tested for HPV. The HPV test could be used to screen women aged 59 years and older, and should be used in women of any age who have unclear Pap test results. After the age of 44, women should have HPV testing at the same frequency as a Pap test.  Colorectal cancer can be detected and often prevented. Most routine colorectal cancer screening begins at the age of 67 and continues through age 69. However, your caregiver may recommend screening at an earlier age if you have risk factors for colon cancer. On a yearly basis, your caregiver may provide home test kits to check for hidden blood in the stool. Use of a small camera at the end of a tube, to directly examine the colon (sigmoidoscopy or colonoscopy), can detect the earliest forms of colorectal cancer. Talk to your caregiver about this at age 64, when routine screening begins. Direct examination of the colon should be repeated every 5 to 10 years through age 6, unless early forms of  pre-cancerous polyps or small growths are found.  Hepatitis C blood testing is recommended for all people born from [birth year] through 1965 and any individual with known risks for hepatitis C.  Practice safe sex. Use condoms and avoid high-risk sexual practices to reduce the spread of sexually transmitted infections (STIs). Sexually active women aged 64 and younger should be checked for Chlamydia, which is a common sexually transmitted infection. Older women with new or multiple partners should also be tested for Chlamydia. Testing for other STIs is recommended if you are sexually active and at increased risk.  Osteoporosis is a disease in which the bones lose minerals and strength with aging. This can result in serious bone fractures. The risk of osteoporosis can be identified using a bone density scan. Women ages 22 and over and women at risk for fractures or osteoporosis should discuss screening with their caregivers. Ask your caregiver whether you should be taking a calcium supplement or vitamin D to reduce the rate of osteoporosis.  Menopause can be associated with physical symptoms and risks. Hormone replacement therapy is available to decrease symptoms and risks. You should talk to your caregiver about whether hormone replacement therapy is right for you.  Use sunscreen. Apply sunscreen liberally and repeatedly throughout the day. You should seek shade when your shadow is shorter than you. Protect yourself by wearing long sleeves, pants, a wide-brimmed hat, and sunglasses year round, whenever you are outdoors.  Notify your caregiver of new moles or changes in moles, especially if there is a change in shape or color. Also notify your caregiver if a mole is larger than the size of a pencil eraser.  Stay current with your immunizations. Document Released: 05/29/2011 Document Revised: 03/10/2013 Document Reviewed: 05/29/2011 [facility] Patient Information 2014 [place].

## 2014-09-01 LAB — URINALYSIS W MICROSCOPIC + REFLEX CULTURE
BILIRUBIN URINE: NEGATIVE
Bacteria, UA: NONE SEEN
Casts: NONE SEEN
Crystals: NONE SEEN
GLUCOSE, UA: NEGATIVE mg/dL
HGB URINE DIPSTICK: NEGATIVE
Ketones, ur: NEGATIVE mg/dL
LEUKOCYTES UA: NEGATIVE
Nitrite: NEGATIVE
PROTEIN: NEGATIVE mg/dL
SQUAMOUS EPITHELIAL / LPF: NONE SEEN
Specific Gravity, Urine: <1.005 — ABNORMAL LOW (ref 1.005–1.030)
UROBILINOGEN UA: 0.2 mg/dL (ref 0.0–1.0)
pH: 5.5 (ref 5.0–8.0)

## 2014-09-01 LAB — CBC WITH DIFFERENTIAL/PLATELET
BASOS ABS: 0.1 10*3/uL (ref 0.0–0.1)
BASOS PCT: 1 % (ref 0–1)
EOS ABS: 0.2 10*3/uL (ref 0.0–0.7)
EOS PCT: 3 % (ref 0–5)
HCT: 39.6 % (ref 36.0–46.0)
Hemoglobin: 13.7 g/dL (ref 12.0–15.0)
LYMPHS PCT: 30 % (ref 12–46)
Lymphs Abs: 2.4 10*3/uL (ref 0.7–4.0)
MCH: 30.9 pg (ref 26.0–34.0)
MCHC: 34.6 g/dL (ref 30.0–36.0)
MCV: 89.4 fL (ref 78.0–100.0)
MONO ABS: 0.8 10*3/uL (ref 0.1–1.0)
Monocytes Relative: 10 % (ref 3–12)
Neutro Abs: 4.4 10*3/uL (ref 1.7–7.7)
Neutrophils Relative %: 56 % (ref 43–77)
PLATELETS: 415 10*3/uL — AB (ref 150–400)
RBC: 4.43 MIL/uL (ref 3.87–5.11)
RDW: 13.1 % (ref 11.5–15.5)
WBC: 7.9 10*3/uL (ref 4.0–10.5)

## 2014-09-01 LAB — COMPREHENSIVE METABOLIC PANEL
ALK PHOS: 50 U/L (ref 39–117)
ALT: 11 U/L (ref 0–35)
AST: 16 U/L (ref 0–37)
Albumin: 4.5 g/dL (ref 3.5–5.2)
BILIRUBIN TOTAL: 0.4 mg/dL (ref 0.2–1.2)
BUN: 9 mg/dL (ref 6–23)
CO2: 29 mEq/L (ref 19–32)
CREATININE: 0.75 mg/dL (ref 0.50–1.10)
Calcium: 9 mg/dL (ref 8.4–10.5)
Chloride: 99 mEq/L (ref 96–112)
Glucose, Bld: 92 mg/dL (ref 70–99)
Potassium: 4.2 mEq/L (ref 3.5–5.3)
Sodium: 136 mEq/L (ref 135–145)
Total Protein: 7.3 g/dL (ref 6.0–8.3)

## 2014-09-01 LAB — LIPID PANEL
CHOL/HDL RATIO: 3.1 ratio
Cholesterol: 187 mg/dL (ref 0–200)
HDL: 60 mg/dL (ref >39)
LDL Cholesterol: 101 mg/dL — ABNORMAL HIGH (ref 0–99)
Triglycerides: 131 mg/dL (ref <150)
VLDL: 26 mg/dL (ref 0–40)

## 2014-09-01 LAB — TSH: TSH: 1.262 u[IU]/mL (ref 0.350–4.500)

## 2014-09-02 LAB — CYTOLOGY - PAP

## 2014-09-03 ENCOUNTER — Other Ambulatory Visit: Payer: Self-pay | Admitting: Gynecology

## 2014-09-03 MED ORDER — FLUCONAZOLE 150 MG PO TABS: 150.0000 mg | ORAL_TABLET | Freq: Once | ORAL | Status: DC

## 2014-09-07 ENCOUNTER — Other Ambulatory Visit: Payer: Self-pay | Admitting: Gynecology

## 2014-09-09 ENCOUNTER — Telehealth: Payer: Self-pay | Admitting: *Deleted

## 2014-09-09 NOTE — Telephone Encounter (Signed)
(  pt aware you are out of the office) Pt called requesting to start back on lexapro 10 mg , stopped to see if Rx was do to weight gain, having problems sleeping would like to start back on Rx. Please advise

## 2014-09-10 MED ORDER — ESCITALOPRAM OXALATE 10 MG PO TABS: 10.0000 mg | ORAL_TABLET | Freq: Every day | ORAL | Status: DC

## 2014-09-10 NOTE — Telephone Encounter (Signed)
OK for Lexapro 10 mg refill X one year

## 2014-09-10 NOTE — Telephone Encounter (Signed)
Pt informed, rx sent 

## 2014-09-28 ENCOUNTER — Encounter: Payer: Self-pay | Admitting: Gynecology

## 2014-10-08 ENCOUNTER — Other Ambulatory Visit: Payer: Self-pay

## 2014-10-08 MED ORDER — ESCITALOPRAM OXALATE 10 MG PO TABS: 10.0000 mg | ORAL_TABLET | Freq: Every day | ORAL | Status: DC

## 2014-12-10 ENCOUNTER — Other Ambulatory Visit: Payer: Self-pay | Admitting: *Deleted

## 2014-12-10 ENCOUNTER — Encounter: Payer: Self-pay | Admitting: Internal Medicine

## 2014-12-10 ENCOUNTER — Ambulatory Visit (INDEPENDENT_AMBULATORY_CARE_PROVIDER_SITE_OTHER): Payer: BLUE CROSS/BLUE SHIELD | Admitting: Internal Medicine

## 2014-12-10 VITALS — BP 100/72 | HR 104 | Temp 98.6°F | Resp 16 | Ht 65.0 in | Wt 179.6 lb

## 2014-12-10 DIAGNOSIS — G2581 Restless legs syndrome: Secondary | ICD-10-CM

## 2014-12-10 DIAGNOSIS — J309 Allergic rhinitis, unspecified: Secondary | ICD-10-CM

## 2014-12-10 DIAGNOSIS — J0100 Acute maxillary sinusitis, unspecified: Secondary | ICD-10-CM

## 2014-12-10 MED ORDER — FLUTICASONE PROPIONATE 50 MCG/ACT NA SUSP: NASAL | Status: DC

## 2014-12-10 MED ORDER — LEVOFLOXACIN 500 MG PO TABS: ORAL_TABLET | ORAL | Status: DC

## 2014-12-10 MED ORDER — LORAZEPAM 1 MG PO TABS: ORAL_TABLET | ORAL | Status: DC

## 2014-12-10 MED ORDER — PREDNISONE 20 MG PO TABS: ORAL_TABLET | ORAL | Status: DC

## 2014-12-10 MED ORDER — FLUCONAZOLE 150 MG PO TABS: ORAL_TABLET | ORAL | Status: DC

## 2014-12-10 NOTE — Progress Notes (Signed)
   Subjective:    Patient ID: Christine Frank, female    DOB: 04/15/1966, 49 y.o.   MRN: [id number]  HPI Pt c/o several day sx's of sinus pressure/congestion & productive cough- yellow green sputum.Marland Kitchen Also has hx/o allergic rhintis. (+) febver & aches - no chills/rigors. Reports sx's consistent with restless legs in the evenings.  Medication Sig  . escitalopram  10 MG tablet Take 1 tablet (10 mg total) by mouth daily.  . MULTIVITAMIN Take 1 capsule by mouth daily.  . fluconazole (DIFLUCAN) 150 MG tablet  (w/ Abx use)  . fluticasone (FLONASE)  nasal spray Place 2 sprays into the nose daily.    Allergies  Allergen Reactions  . Sulfa Antibiotics Swelling and Rash   Past Medical History  Diagnosis Date  . PMDD (premenstrual dysphoric disorder)   . Restless leg    Review of Systems Neg & non contributory except as above    Objective:   Physical Exam BP 100/72 Pulse 104  Temp 98.6 F   Resp 16  Ht 5\' 5"    Wt 179 lb 9.6 oz    BMI 29.89   Sl hoarse raspy voice . Dry hacking cough. No respiratory distress. HEENT - Eac's patent. TM's Nl. EOM's full. Bilat Frontal/Maxillary tenderness (Rt>Lt). PERRLA. NasoOroPharynx clear. Neck - supple. Nl Thyroid. Carotids 2+ & No bruits, nodes, JVD Chest - Clear equal BS w/o Rales, rhonchi, wheezes. Cor - Nl HS. RRR w/o sig MGR. PP 1(+). No edema. MS- FROM w/o deformities. Muscle power, tone and bulk Nl. Gait Nl. Neuro - No obvious Cr N abnormalities. Sensory, motor and Cerebellar functions appear Nl w/o focal abnormalities. Psyche - Mental status normal & appropriate.     Assessment & Plan:   1. Acute maxillary sinusitis, recurrence not specified  - levofloxacin (LEVAQUIN) 500 MG tablet; Take 1 tablet daily with food for infection  Dispense: 15 tablet; Refill: 1 - predniSONE (DELTASONE) 20 MG tablet; 1 tab 3 x day for 3 days, then 1 tab 2 x day for 3 days, then 1 tab 1 x day for 5 days  Dispense: 20 tablet; Refill: 0  2. Allergic rhinitis,  unspecified allergic rhinitis type  - fluticasone (FLONASE) 50 MCG/ACT nasal spray; Take 1 to 2 sprays each nostril 2 x daily  Dispense: 16 g; Refill: 99  3. Restless legs syndrome  - LORazepam (ATIVAN) 1 MG tablet; Take 1/2 to 1 or 2 tablets daily as needed for Restless Legs  Dispense: 60 tablet; Refill: 3  - ROV - prn

## 2015-03-31 ENCOUNTER — Other Ambulatory Visit: Payer: Self-pay | Admitting: Internal Medicine

## 2015-04-15 ENCOUNTER — Other Ambulatory Visit: Payer: Self-pay | Admitting: Internal Medicine

## 2015-04-22 ENCOUNTER — Encounter: Payer: Self-pay | Admitting: Internal Medicine

## 2015-04-22 ENCOUNTER — Ambulatory Visit (INDEPENDENT_AMBULATORY_CARE_PROVIDER_SITE_OTHER): Payer: Federal, State, Local not specified - PPO | Admitting: Internal Medicine

## 2015-04-22 VITALS — BP 118/78 | HR 84 | Temp 98.0°F | Resp 18 | Ht 65.0 in | Wt 180.0 lb

## 2015-04-22 DIAGNOSIS — G2581 Restless legs syndrome: Secondary | ICD-10-CM

## 2015-04-22 DIAGNOSIS — M79604 Pain in right leg: Secondary | ICD-10-CM

## 2015-04-22 DIAGNOSIS — Z79899 Other long term (current) drug therapy: Secondary | ICD-10-CM

## 2015-04-22 DIAGNOSIS — M79605 Pain in left leg: Secondary | ICD-10-CM

## 2015-04-22 LAB — CBC WITH DIFFERENTIAL/PLATELET
BASOS PCT: 0 % (ref 0–1)
Basophils Absolute: 0 10*3/uL (ref 0.0–0.1)
Eosinophils Absolute: 0.3 10*3/uL (ref 0.0–0.7)
Eosinophils Relative: 3 % (ref 0–5)
HEMATOCRIT: 36.3 % (ref 36.0–46.0)
Hemoglobin: 12.7 g/dL (ref 12.0–15.0)
LYMPHS ABS: 2.6 10*3/uL (ref 0.7–4.0)
LYMPHS PCT: 25 % (ref 12–46)
MCH: 30.9 pg (ref 26.0–34.0)
MCHC: 35 g/dL (ref 30.0–36.0)
MCV: 88.3 fL (ref 78.0–100.0)
MONOS PCT: 10 % (ref 3–12)
MPV: 8.5 fL — AB (ref 8.6–12.4)
Monocytes Absolute: 1.1 10*3/uL — ABNORMAL HIGH (ref 0.1–1.0)
NEUTROS PCT: 62 % (ref 43–77)
Neutro Abs: 6.5 10*3/uL (ref 1.7–7.7)
Platelets: 370 10*3/uL (ref 150–400)
RBC: 4.11 MIL/uL (ref 3.87–5.11)
RDW: 13.7 % (ref 11.5–15.5)
WBC: 10.5 10*3/uL (ref 4.0–10.5)

## 2015-04-22 MED ORDER — GABAPENTIN 100 MG PO CAPS: 100.0000 mg | ORAL_CAPSULE | Freq: Three times a day (TID) | ORAL | Status: DC

## 2015-04-22 MED ORDER — DULOXETINE HCL 20 MG PO CPEP: 20.0000 mg | ORAL_CAPSULE | Freq: Every day | ORAL | Status: DC

## 2015-04-22 NOTE — Patient Instructions (Signed)
Restless Legs Syndrome Restless legs syndrome is a movement disorder. It may also be called a sensorimotor disorder.  CAUSES  No one knows what specifically causes restless legs syndrome, but it tends to run in families. It is also more common in people with low iron, in pregnancy, in people who need dialysis, and those with nerve damage (neuropathy).Some medications may make restless legs syndrome worse.Those medications include drugs to treat high blood pressure, some heart conditions, nausea, colds, allergies, and depression. SYMPTOMS Symptoms include uncomfortable sensations in the legs. These leg sensations are worse during periods of inactivity or rest. They are also worse while sitting or lying down. Individuals that have the disorder describe sensations in the legs that feel like:  Pulling.  Drawing.  Crawling.  Worming.  Boring.  Tingling.  Pins and needles.  Prickling.  Pain. The sensations are usually accompanied by an overwhelming urge to move the legs. Sudden muscle jerks may also occur. Movement provides temporary relief from the discomfort. In rare cases, the arms may also be affected. Symptoms may interfere with going to sleep (sleep onset insomnia). Restless legs syndrome may also be related to periodic limb movement disorder (PLMD). PLMD is another more common motor disorder. It also causes interrupted sleep. The symptoms from PLMD usually occur most often when you are awake. TREATMENT  Treatment for restless legs syndrome is symptomatic. This means that the symptoms are treated.   Massage and cold compresses may provide temporary relief.  Walk, stretch, or take a cold or hot bath.  Get regular exercise and a good night's sleep.  Avoid caffeine, alcohol, nicotine, and medications that can make it worse.  Do activities that provide mental stimulation like discussions, needlework, and video games. These may be helpful if you are not able to walk or stretch. Some  medications are effective in relieving the symptoms. However, many of these medications have side effects. Ask your caregiver about medications that may help your symptoms. Correcting iron deficiency may improve symptoms for some patients. Document Released: 11/03/2002 Document Revised: 03/30/2014 Document Reviewed: 02/09/2011 [employer] Patient Information 2015 [employer]. This information is not intended to replace advice given to you by your health care provider. Make sure you discuss any questions you have with your health care provider.  

## 2015-04-22 NOTE — Progress Notes (Signed)
Subjective:    Patient ID: Christine Frank, female    DOB: 1966-11-10, 49 y.o.   MRN: [id number]  HPI  Patient presents to the office for evaluation of joint pain and myalgias which has been going on for a "long time".  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  She reports that on top of that she feels like her legs are restless.  She reports that the only thing that works to fix her pain is vicoden 10/325 mg.  She reports that she would like to go to pain management for her pain.  She reports that she cannot keep up with her weight because she has no exercise that doesn't hurt her.  So far she has tried mobic and advil.  She has tried ativan for some time for restless legs.  She reports that some of that can help to take some of the edge of it.  She reports that she does not like taking these medications.  She reports that she has never tried flexeril or robaxin or requip for the restless leg syndrome.    No family history of rheumatological disorders.     Review of Systems  Constitutional: Negative for fever, chills and fatigue.  Respiratory: Negative for chest tightness and shortness of breath.   Cardiovascular: Negative for chest pain and palpitations.  Gastrointestinal: Negative for nausea and vomiting.  Musculoskeletal: Positive for myalgias, joint swelling and arthralgias.  Neurological: Negative for dizziness, weakness, light-headedness and numbness.       Objective:   Physical Exam  Constitutional: She is oriented to person, place, and time. She appears well-developed and well-nourished. No distress.  HENT:  Head: Normocephalic and atraumatic.  Mouth/Throat: Oropharynx is clear and moist. No oropharyngeal exudate.  Eyes: Conjunctivae are normal. No scleral icterus.  Neck: Normal range of motion. Neck supple. No JVD present. No thyromegaly present.    Cardiovascular: Normal rate, regular rhythm, normal heart sounds and intact distal pulses.   Pulmonary/Chest: Effort normal and breath sounds normal. No respiratory distress. She has no wheezes. She has no rales. She exhibits no tenderness.  Abdominal: Soft. Bowel sounds are normal. She exhibits no distension and no mass. There is no tenderness. There is no rebound and no guarding.  Musculoskeletal:       Right hip: She exhibits tenderness. She exhibits normal range of motion, normal strength, no bony tenderness, no swelling, no crepitus, no deformity and no laceration.       Left hip: She exhibits tenderness. She exhibits normal range of motion, normal strength, no bony tenderness, no swelling, no crepitus, no deformity and no laceration.       Right knee: She exhibits normal range of motion, no swelling, no effusion, no ecchymosis, no deformity, no laceration, no erythema, normal alignment, no LCL laxity, normal patellar mobility, no bony tenderness, normal meniscus and no MCL laxity. No tenderness found. No medial joint line, no lateral joint line, no MCL, no LCL and no patellar tendon tenderness noted.       Left knee: She exhibits normal range of motion, no swelling, no effusion, no ecchymosis, no deformity, no laceration, no erythema, normal alignment, no LCL laxity, normal patellar mobility, no bony tenderness, normal meniscus and no MCL laxity. No tenderness found. No medial joint line, no lateral joint line, no MCL, no LCL and no patellar tendon tenderness noted.  Right ankle: She exhibits normal range of motion, no swelling, no ecchymosis, no deformity, no laceration and normal pulse. No tenderness. Achilles tendon normal.       Left ankle: She exhibits normal range of motion, no swelling, no ecchymosis, no deformity, no laceration and normal pulse. Tenderness. Lateral malleolus tenderness found. No medial malleolus, no AITFL, no CF ligament, no posterior TFL, no head of 5th metatarsal and  no proximal fibula tenderness found. Achilles tendon normal.       Legs: Lymphadenopathy:    She has no cervical adenopathy.  Neurological: She is alert and oriented to person, place, and time. No cranial nerve deficit or sensory deficit. Coordination and gait normal.  Skin: Skin is warm and dry. She is not diaphoretic.  Psychiatric: She has a normal mood and affect. Her behavior is normal. Judgment and thought content normal.  Nursing note and vitals reviewed.         Assessment & Plan:  Exam is not consistent with OA in multiple joints.  Some may exist but not certain that this is the cause of the leg pain.  Rule out organic causes.  D/c lexapro and start cymbalta to see if this helps with pain.  Try gabapentin.  Consider possible use of requip if no improvement in restless legs.  Refer to ortho for second opinion once labs return.    1. Restless legs  - DULoxetine (CYMBALTA) 20 MG capsule; Take 1 capsule (20 mg total) by mouth daily.  Dispense: 60 capsule; Refill: 2 - gabapentin (NEURONTIN) 100 MG capsule; Take 1 capsule (100 mg total) by mouth 3 (three) times daily.  Dispense: 90 capsule; Refill: 2 - Iron and TIBC - Vitamin B12 - Ferritin  2. Bilateral leg pain  - Sedimentation rate - Angiotensin converting enzyme - Anti-DNA antibody, double-stranded - ANA - C3 and C4 - Cyclic citrul peptide antibody, IgG - C-reactive protein - Rheumatoid factor - CK - Cortisol - Uric acid - Aldolase - Complement, total  3. Medication management  - CBC with Differential/Platelet - BASIC METABOLIC PANEL WITH GFR - Hepatic function panel - Magnesium

## 2015-04-23 LAB — URIC ACID: URIC ACID, SERUM: 4.2 mg/dL (ref 2.4–7.0)

## 2015-04-23 LAB — SEDIMENTATION RATE: Sed Rate: 11 mm/hr (ref 0–20)

## 2015-04-23 LAB — HEPATIC FUNCTION PANEL
ALK PHOS: 51 U/L (ref 39–117)
ALT: 16 U/L (ref 0–35)
AST: 20 U/L (ref 0–37)
Albumin: 4.1 g/dL (ref 3.5–5.2)
BILIRUBIN TOTAL: 0.4 mg/dL (ref 0.2–1.2)
Bilirubin, Direct: 0.1 mg/dL (ref 0.0–0.3)
Indirect Bilirubin: 0.3 mg/dL (ref 0.2–1.2)
Total Protein: 7.4 g/dL (ref 6.0–8.3)

## 2015-04-23 LAB — C3 AND C4
C3 Complement: 154 mg/dL (ref 90–180)
C4 Complement: 24 mg/dL (ref 10–40)

## 2015-04-23 LAB — C-REACTIVE PROTEIN: CRP: <0.5 mg/dL (ref <0.60)

## 2015-04-23 LAB — BASIC METABOLIC PANEL WITH GFR
BUN: 14 mg/dL (ref 6–23)
CALCIUM: 9.4 mg/dL (ref 8.4–10.5)
CO2: 20 meq/L (ref 19–32)
CREATININE: 0.74 mg/dL (ref 0.50–1.10)
Chloride: 102 mEq/L (ref 96–112)
Glucose, Bld: 89 mg/dL (ref 70–99)
POTASSIUM: 3.8 meq/L (ref 3.5–5.3)
SODIUM: 136 meq/L (ref 135–145)

## 2015-04-23 LAB — CORTISOL: Cortisol, Plasma: 2 ug/dL

## 2015-04-23 LAB — IRON AND TIBC
%SAT: 21 % (ref 20–55)
Iron: 73 ug/dL (ref 42–145)
TIBC: 348 ug/dL (ref 250–470)
UIBC: 275 ug/dL (ref 125–400)

## 2015-04-23 LAB — RHEUMATOID FACTOR

## 2015-04-23 LAB — VITAMIN B12: Vitamin B-12: 654 pg/mL (ref 211–911)

## 2015-04-23 LAB — ANA: Anti Nuclear Antibody(ANA): NEGATIVE

## 2015-04-23 LAB — FERRITIN: Ferritin: 140 ng/mL (ref 10–291)

## 2015-04-23 LAB — CYCLIC CITRUL PEPTIDE ANTIBODY, IGG: Cyclic Citrullin Peptide Ab: <2 U/mL (ref 0.0–5.0)

## 2015-04-23 LAB — CK: Total CK: 130 U/L (ref 7–177)

## 2015-04-23 LAB — ANGIOTENSIN CONVERTING ENZYME: Angiotensin-Converting Enzyme: 44 U/L (ref 8–52)

## 2015-04-23 LAB — MAGNESIUM: Magnesium: 1.8 mg/dL (ref 1.5–2.5)

## 2015-04-23 LAB — ANTI-DNA ANTIBODY, DOUBLE-STRANDED

## 2015-04-28 ENCOUNTER — Other Ambulatory Visit: Payer: Self-pay | Admitting: Internal Medicine

## 2015-04-28 ENCOUNTER — Telehealth: Payer: Self-pay | Admitting: *Deleted

## 2015-04-28 LAB — COMPLEMENT, TOTAL: Compl, Total (CH50): >60 U/mL — ABNORMAL HIGH (ref 31–60)

## 2015-04-28 LAB — ALDOLASE: ALDOLASE: 4.1 U/L (ref <=8.1)

## 2015-04-28 NOTE — Telephone Encounter (Signed)
Patient called and left detailed message with the front staff about wanting referral to Ortho.  Patient requesting Ortho eval of increasing pain in multiple joints... Knees, hips, feet,etc. States she is now having left ankle swelling and pain in her left foot.  Per Starlyn Skeans, PA-C orders I called and advised patient that labs all were normal and Loma Sousa will place order for Ortho referral in [place] to be sure we aren't missing anything.  Also advised her there is a pain management doc there and physical medicine doctor there who may be able to offer some help since patient requesting pain management referral and Loma Sousa does not feel warranted at this time.  Advised her we will call with appointment info as soon as it is scheduled.

## 2015-04-29 ENCOUNTER — Other Ambulatory Visit: Payer: Self-pay | Admitting: Internal Medicine

## 2015-04-29 DIAGNOSIS — M79604 Pain in right leg: Secondary | ICD-10-CM

## 2015-04-29 DIAGNOSIS — M79605 Pain in left leg: Secondary | ICD-10-CM

## 2015-06-10 ENCOUNTER — Encounter: Payer: Self-pay | Admitting: Internal Medicine

## 2015-06-10 ENCOUNTER — Ambulatory Visit (INDEPENDENT_AMBULATORY_CARE_PROVIDER_SITE_OTHER): Payer: Federal, State, Local not specified - PPO | Admitting: Internal Medicine

## 2015-06-10 VITALS — BP 112/70 | HR 92 | Temp 98.6°F | Resp 16 | Ht 65.0 in | Wt 179.0 lb

## 2015-06-10 DIAGNOSIS — G2581 Restless legs syndrome: Secondary | ICD-10-CM | POA: Diagnosis not present

## 2015-06-10 MED ORDER — LORAZEPAM 1 MG PO TABS: 1.0000 mg | ORAL_TABLET | Freq: Three times a day (TID) | ORAL | Status: DC | PRN

## 2015-06-10 MED ORDER — DULOXETINE HCL 40 MG PO CPEP: 40.0000 mg | ORAL_CAPSULE | Freq: Every day | ORAL | Status: DC

## 2015-06-10 NOTE — Progress Notes (Signed)
   Subjective:    Patient ID: Christine Frank, female    DOB: [birth year], 49 y.o.   MRN: [id number]  HPI  Patient presents to the office for 6 week recheck of bilateral leg pain. She was started on cymbalta and gabapentin 6 weeks ago for her leg pain.  She reports that the cymbalta is helping her mood and slightly helping with her pain.  She reports that she has seen the orthopedists who has done an evaluation and found that her joints have no issues and they feel that it may be related to her connective tissues.  She feels that she has had some relief with some exercise.  She did not continue to take the gabapentin as it was causing her some constipation.  She feels better so she is going to try to do some PT and is doing exercise at home.    She is due to start therapy in the last week of this month on Tuesdays and Thursdays.    Review of Systems  Constitutional: Negative for fever, chills and fatigue.  Respiratory: Negative for chest tightness and shortness of breath.   Cardiovascular: Negative for chest pain and palpitations.  Gastrointestinal: Negative for nausea, vomiting, abdominal pain, diarrhea and constipation.  Musculoskeletal: Positive for myalgias.  Psychiatric/Behavioral: Positive for agitation. The patient is nervous/anxious.        Objective:   Physical Exam  Constitutional: She is oriented to person, place, and time. She appears well-developed and well-nourished. No distress.  HENT:  Head: Normocephalic.  Mouth/Throat: Oropharynx is clear and moist. No oropharyngeal exudate.  Eyes: Conjunctivae are normal. No scleral icterus.  Neck: Normal range of motion. Neck supple. No JVD present. No thyromegaly present.  Cardiovascular: Normal rate, regular rhythm, normal heart sounds and intact distal pulses.  Exam reveals no gallop and no friction rub.   No murmur heard. Pulmonary/Chest: Effort normal and breath sounds normal. No respiratory distress. She has no wheezes. She  has no rales. She exhibits no tenderness.  Abdominal: Soft. Bowel sounds are normal. She exhibits no distension and no mass. There is no tenderness. There is no rebound and no guarding.  Musculoskeletal: Normal range of motion.  Lymphadenopathy:    She has no cervical adenopathy.  Neurological: She is alert and oriented to person, place, and time.  Skin: Skin is warm and dry. She is not diaphoretic.  Psychiatric: She has a normal mood and affect. Her behavior is normal. Judgment and thought content normal.  Nursing note and vitals reviewed.         Assessment & Plan:    1. Restless legs -cont ativan, can take up to three time daily as needed for restless legs -mood and depression appears improved with cymbalta will increase to 40 mg daily to see if this completely improves muscle pains -recheck in 1-2 months -ortho eval and rheum eval negative. - DULoxetine HCl 40 MG CPEP; Take 40 mg by mouth daily.  Dispense: 60 capsule; Refill: 2

## 2015-07-22 ENCOUNTER — Other Ambulatory Visit: Payer: Self-pay | Admitting: Internal Medicine

## 2015-07-29 ENCOUNTER — Ambulatory Visit: Payer: Self-pay | Admitting: Internal Medicine

## 2015-08-05 ENCOUNTER — Ambulatory Visit (INDEPENDENT_AMBULATORY_CARE_PROVIDER_SITE_OTHER): Payer: Federal, State, Local not specified - PPO | Admitting: Internal Medicine

## 2015-08-05 ENCOUNTER — Encounter: Payer: Self-pay | Admitting: Internal Medicine

## 2015-08-05 VITALS — BP 122/80 | HR 108 | Temp 98.6°F | Resp 16 | Ht 65.0 in | Wt 176.0 lb

## 2015-08-05 DIAGNOSIS — G2581 Restless legs syndrome: Secondary | ICD-10-CM | POA: Diagnosis not present

## 2015-08-05 DIAGNOSIS — G43109 Migraine with aura, not intractable, without status migrainosus: Secondary | ICD-10-CM

## 2015-08-05 DIAGNOSIS — R002 Palpitations: Secondary | ICD-10-CM | POA: Diagnosis not present

## 2015-08-05 LAB — CBC WITH DIFFERENTIAL/PLATELET
Basophils Absolute: 0 10*3/uL (ref 0.0–0.1)
Basophils Relative: 0 % (ref 0–1)
Eosinophils Absolute: 0.2 10*3/uL (ref 0.0–0.7)
Eosinophils Relative: 2 % (ref 0–5)
HEMATOCRIT: 39.3 % (ref 36.0–46.0)
Hemoglobin: 13.5 g/dL (ref 12.0–15.0)
LYMPHS ABS: 2.3 10*3/uL (ref 0.7–4.0)
Lymphocytes Relative: 27 % (ref 12–46)
MCH: 31 pg (ref 26.0–34.0)
MCHC: 34.4 g/dL (ref 30.0–36.0)
MCV: 90.3 fL (ref 78.0–100.0)
MPV: 8.4 fL — AB (ref 8.6–12.4)
Monocytes Absolute: 0.7 10*3/uL (ref 0.1–1.0)
Monocytes Relative: 8 % (ref 3–12)
NEUTROS ABS: 5.5 10*3/uL (ref 1.7–7.7)
NEUTROS PCT: 63 % (ref 43–77)
Platelets: 375 10*3/uL (ref 150–400)
RBC: 4.35 MIL/uL (ref 3.87–5.11)
RDW: 13.2 % (ref 11.5–15.5)
WBC: 8.7 10*3/uL (ref 4.0–10.5)

## 2015-08-05 LAB — BASIC METABOLIC PANEL WITH GFR
BUN: 11 mg/dL (ref 7–25)
CHLORIDE: 98 mmol/L (ref 98–110)
CO2: 29 mmol/L (ref 20–31)
Calcium: 9.4 mg/dL (ref 8.6–10.2)
Creat: 0.65 mg/dL (ref 0.50–1.10)
GLUCOSE: 88 mg/dL (ref 65–99)
Potassium: 4.4 mmol/L (ref 3.5–5.3)
Sodium: 138 mmol/L (ref 135–146)

## 2015-08-05 LAB — HEPATIC FUNCTION PANEL
ALBUMIN: 4.4 g/dL (ref 3.6–5.1)
ALK PHOS: 51 U/L (ref 33–115)
ALT: 12 U/L (ref 6–29)
AST: 17 U/L (ref 10–35)
BILIRUBIN TOTAL: 0.3 mg/dL (ref 0.2–1.2)
Bilirubin, Direct: 0.1 mg/dL (ref <=0.2)
Indirect Bilirubin: 0.2 mg/dL (ref 0.2–1.2)
TOTAL PROTEIN: 7.2 g/dL (ref 6.1–8.1)

## 2015-08-05 LAB — TSH: TSH: 1.014 u[IU]/mL (ref 0.350–4.500)

## 2015-08-05 MED ORDER — DULOXETINE HCL 40 MG PO CPEP: 40.0000 mg | ORAL_CAPSULE | Freq: Every day | ORAL | Status: DC

## 2015-08-05 MED ORDER — BUTALBITAL-ASPIRIN-CAFFEINE 50-325-40 MG PO CAPS: 1.0000 | ORAL_CAPSULE | Freq: Four times a day (QID) | ORAL | Status: DC | PRN

## 2015-08-05 MED ORDER — ATENOLOL 25 MG PO TABS: 25.0000 mg | ORAL_TABLET | Freq: Every day | ORAL | Status: DC

## 2015-08-05 MED ORDER — LORAZEPAM 1 MG PO TABS: 1.0000 mg | ORAL_TABLET | Freq: Three times a day (TID) | ORAL | Status: DC | PRN

## 2015-08-05 NOTE — Patient Instructions (Signed)

## 2015-08-05 NOTE — Progress Notes (Signed)
Subjective:    Patient ID: Christine Frank, female    DOB: 05/23/1966, 49 y.o.   MRN: [id number]  HPI  Patient reports to the office for evaluation of restless leg and bilateral lower leg pain.  Patient was started on cymbalta 40 mg approximately 2 months ago and was due to start therapy.  She was unable to go to physical therapy.  She reports that she has been doing great.  She was unable to start therapy due to time constraints.    She does report that she has been having migraine headaches.  She reports that the headaches occur regularly around at least once per week and last for at least 24 hours.  She feels that she gets dull aching headaches, with sweating, hot flashes, and nausea.  She reports that she has tried taking medications including pain medicines and also tried laying down for it.  She feels like she has the sensation that her heart is fluttering.  She has kept a headache diary and it was related more so to hormones.  She reports that since she is perimenopausal the headaches have gotten worse.     Review of Systems  Constitutional: Negative for fever, chills and fatigue.  HENT: Negative for congestion, postnasal drip, rhinorrhea and sore throat.   Eyes: Positive for visual disturbance. Negative for photophobia.  Respiratory: Negative for chest tightness and shortness of breath.   Cardiovascular: Positive for palpitations. Negative for chest pain and leg swelling.  Gastrointestinal: Positive for abdominal pain. Negative for nausea, vomiting, diarrhea and constipation.  Genitourinary: Negative.   Musculoskeletal: Negative.   Neurological: Positive for dizziness, light-headedness and headaches. Negative for weakness.  Psychiatric/Behavioral: Negative for confusion and sleep disturbance. The patient is not nervous/anxious.        Objective:   Physical Exam  Constitutional: She is oriented to person, place, and time. She appears well-developed and well-nourished. No  distress.  HENT:  Head: Normocephalic.  Mouth/Throat: Oropharynx is clear and moist. No oropharyngeal exudate.  Eyes: Conjunctivae are normal. No scleral icterus.  Neck: Normal range of motion. Neck supple. No JVD present. No thyromegaly present.  Cardiovascular: Normal rate, regular rhythm, normal heart sounds and intact distal pulses.  Exam reveals no gallop and no friction rub.   No murmur heard. Pulmonary/Chest: Effort normal and breath sounds normal. No respiratory distress. She has no wheezes. She has no rales. She exhibits no tenderness.  Abdominal: Soft. Bowel sounds are normal. She exhibits no distension and no mass. There is no tenderness. There is no rebound and no guarding.  Musculoskeletal: Normal range of motion.  Lymphadenopathy:    She has no cervical adenopathy.  Neurological: She is alert and oriented to person, place, and time. She has normal strength. No cranial nerve deficit or sensory deficit. Coordination normal.  Skin: Skin is warm and dry. She is not diaphoretic.  Psychiatric: She has a normal mood and affect. Her behavior is normal. Judgment and thought content normal.  Nursing note and vitals reviewed.   Filed Vitals:   08/05/15 1133  BP: 122/80  Pulse: 108  Temp: 98.6 F (37 C)  Resp: 16         Assessment & Plan:    1. Restless legs -ativan refilled - DULoxetine HCl 40 MG CPEP; Take 40 mg by mouth daily.  Dispense: 60 capsule; Refill: 2  2. Palpitations -ekg normal, try atenolol - CBC with Differential/Platelet - BASIC METABOLIC PANEL WITH GFR - Hepatic function panel - TSH -  EKG 12-Lead  3. Migraine with aura and without status migrainosus, not intractable -atenolol for prevention -fiorenal for abortive

## 2015-09-15 ENCOUNTER — Other Ambulatory Visit: Payer: Self-pay

## 2015-09-15 DIAGNOSIS — Z1231 Encounter for screening mammogram for malignant neoplasm of breast: Secondary | ICD-10-CM

## 2015-09-21 ENCOUNTER — Other Ambulatory Visit: Payer: Self-pay | Admitting: Internal Medicine

## 2015-09-21 DIAGNOSIS — F411 Generalized anxiety disorder: Secondary | ICD-10-CM

## 2015-10-07 ENCOUNTER — Ambulatory Visit: Payer: Federal, State, Local not specified - PPO

## 2015-10-12 ENCOUNTER — Telehealth: Payer: Self-pay | Admitting: Internal Medicine

## 2015-10-12 NOTE — Telephone Encounter (Signed)
patient called to get referral to pain management. PLease advise  Thank you, Leonie Douglas Referral Coordinator  [facility] [phone] ext. [phone] Fax [phone]

## 2015-10-13 NOTE — Telephone Encounter (Signed)
Christine Frank reccommends an office visit to discuss pain management. Left voicemail for patient to call me back.  Christine Frank

## 2015-10-13 NOTE — Telephone Encounter (Signed)
Christine Frank recommends office visit to discuss.  Christine Frank

## 2015-10-14 ENCOUNTER — Encounter: Payer: Self-pay | Admitting: Physician Assistant

## 2015-10-14 ENCOUNTER — Ambulatory Visit (HOSPITAL_COMMUNITY)
Admission: RE | Admit: 2015-10-14 | Discharge: 2015-10-14 | Disposition: A | Discharge status: Home / Self Care | Payer: Federal, State, Local not specified - PPO | Source: Ambulatory Visit | Attending: Physician Assistant | Admitting: Physician Assistant

## 2015-10-14 ENCOUNTER — Encounter (INDEPENDENT_AMBULATORY_CARE_PROVIDER_SITE_OTHER): Payer: Self-pay

## 2015-10-14 ENCOUNTER — Ambulatory Visit (INDEPENDENT_AMBULATORY_CARE_PROVIDER_SITE_OTHER): Payer: Federal, State, Local not specified - PPO | Admitting: Physician Assistant

## 2015-10-14 VITALS — BP 100/70 | HR 76 | Resp 16 | Ht 65.0 in | Wt 178.0 lb

## 2015-10-14 DIAGNOSIS — M5442 Lumbago with sciatica, left side: Secondary | ICD-10-CM

## 2015-10-14 DIAGNOSIS — Z23 Encounter for immunization: Secondary | ICD-10-CM

## 2015-10-14 DIAGNOSIS — G2581 Restless legs syndrome: Secondary | ICD-10-CM | POA: Diagnosis not present

## 2015-10-14 DIAGNOSIS — R531 Weakness: Secondary | ICD-10-CM | POA: Diagnosis not present

## 2015-10-14 DIAGNOSIS — M79604 Pain in right leg: Secondary | ICD-10-CM

## 2015-10-14 DIAGNOSIS — R200 Anesthesia of skin: Secondary | ICD-10-CM | POA: Insufficient documentation

## 2015-10-14 DIAGNOSIS — M5441 Lumbago with sciatica, right side: Secondary | ICD-10-CM

## 2015-10-14 DIAGNOSIS — M545 Low back pain: Secondary | ICD-10-CM | POA: Insufficient documentation

## 2015-10-14 DIAGNOSIS — M79605 Pain in left leg: Secondary | ICD-10-CM | POA: Diagnosis not present

## 2015-10-14 MED ORDER — BACLOFEN 10 MG PO TABS: 10.0000 mg | ORAL_TABLET | Freq: Two times a day (BID) | ORAL | Status: AC

## 2015-10-14 MED ORDER — DULOXETINE HCL 60 MG PO CPEP: 60.0000 mg | ORAL_CAPSULE | Freq: Every day | ORAL | Status: DC

## 2015-10-14 NOTE — Progress Notes (Signed)
Subjective:    Patient ID: Christine Frank, female    DOB: [birth year], 49 y.o.   MRN: [id number]  HPI 49 y.o. WF presents for follow up. She has been following with courtney for bilateral leg pain and migraine. She has hip, knee, ankle, and bilateral shoulder pain, went to ortho and had negative Xrays, suggested PT but declined at the time due to working in [employer]. She works full time as Marine scientist in dialysis center, has son in [place] and grandson that she has to take around. She has bilateral leg swelling, she does wear comp, has difficulty with sleeping at night. She has had negative autoimmune work up. She was stopped from the lexapro and started on cymbalta 40mg  and atenolol with significant improvement reported in her pain however   Blood pressure 100/70, pulse 76, resp. rate 16, height 5\' 5"  (1.651 m), weight 178 lb (80.74 kg), last menstrual period 09/30/2015, SpO2 96 %.  Current Outpatient Prescriptions on File Prior to Visit  Medication Sig Dispense Refill  . atenolol (TENORMIN) 25 MG tablet Take 1 tablet (25 mg total) by mouth daily. 90 tablet 1  . butalbital-aspirin-caffeine (FIORINAL) 50-325-40 MG per capsule Take 1 capsule by mouth every 6 (six) hours as needed for migraine. 40 capsule 1  . DULoxetine HCl 40 MG CPEP Take 40 mg by mouth daily. 60 capsule 2  . escitalopram (LEXAPRO) 10 MG tablet TAKE 1 TABLET (10 MG TOTAL) BY MOUTH DAILY.  11  . fluticasone (FLONASE) 50 MCG/ACT nasal spray Take 1 to 2 sprays each nostril 2 x daily 16 g 99  . LORazepam (ATIVAN) 1 MG tablet TAKE 1 TABLET BY MOUTH EVERY 8 HOURS AS NEEDED FOR ANXIETY 90 tablet 1  . Multiple Vitamin (MULTIVITAMIN) capsule Take 1 capsule by mouth daily.     No current facility-administered medications on file prior to visit.    Past Medical History  Diagnosis Date  . PMDD (premenstrual dysphoric disorder)   . Restless leg     Review of Systems  Constitutional: Negative for fever, chills and fatigue.   Respiratory: Negative for chest tightness and shortness of breath.   Cardiovascular: Negative for chest pain and palpitations.  Gastrointestinal: Negative for nausea, vomiting, abdominal pain, diarrhea and constipation.  Musculoskeletal: Positive for myalgias.  Psychiatric/Behavioral: Positive for agitation. The patient is nervous/anxious.        Objective:   Physical Exam  Constitutional: She is oriented to person, place, and time. She appears well-developed and well-nourished. No distress.  HENT:  Head: Normocephalic.  Mouth/Throat: Oropharynx is clear and moist. No oropharyngeal exudate.  Eyes: Conjunctivae are normal. No scleral icterus.  Neck: Normal range of motion. Neck supple. No JVD present. No thyromegaly present.  Cardiovascular: Normal rate, regular rhythm, normal heart sounds and intact distal pulses.  Exam reveals no gallop and no friction rub.   No murmur heard. Pulmonary/Chest: Effort normal and breath sounds normal. No respiratory distress. She has no wheezes. She has no rales. She exhibits no tenderness.  Abdominal: Soft. Bowel sounds are normal. She exhibits no distension and no mass. There is no tenderness. There is no rebound and no guarding.  Musculoskeletal: Normal range of motion.  + SI tenderness bilateral, full ROM of lower back, + pain at IT band with palpation/rotation of bilateral hips, normal distal neurovascular exam legs, normal strength and reflexes.   Lymphadenopathy:    She has no cervical adenopathy.  Neurological: She is alert and oriented to person, place, and time. She  has normal strength. No cranial nerve deficit or sensory deficit. Coordination normal.  Skin: Skin is warm and dry. She is not diaphoretic.  Psychiatric: She has a normal mood and affect. Her behavior is normal. Judgment and thought content normal.  Nursing note and vitals reviewed.      Assessment & Plan:  1. Need for prophylactic vaccination and inoculation against influenza -  Flu vaccine greater than or equal to 3yo preservative free IM  2. Restless legs/bilateral leg pain/ Bilateral low back pain with sciatica, sciatica laterality unspecified Lyrica 75mg  samples given, increase cymbalta to 60mg , baclofen if lyrica not helpful Will call about PT place willing to go to - [facility]; Future

## 2015-10-14 NOTE — Patient Instructions (Addendum)
Can take the lyrica samples for nerve pain. It can make you sleepy so we suggest trying it at night first and please plan to not drive or do anything strenuous. Also please do not take this medication with alcohol.  Start out 1 pill at night before bed, can increase to 2 pills at night before bed. Please call the office if you have any side effects.   Can take 3 pills a day however you would like  Some examples: - 1 breakfast, lunch, bedtime. - 1 at breakfast, 2 at bed time  How should I use this medicine? Take this medicine by mouth with a glass of water. Follow the directions on the prescription label. You can take this medicine with or without food. Take your doses at regular intervals. Do not take your medicine more often than directed. Do not stop taking except on your doctor's advice.  What if I miss a dose? If you miss a dose, take it as soon as you can. If it is almost time for your next dose, take only that dose. Do not take double or extra doses.  What should I watch for while using this medicine? Tell your doctor or healthcare professional if your symptoms do not start to get better or if they get worse.   You may get drowsy or dizzy. Do not drive, use machinery, or do anything that needs mental alertness until you know how this medicine affects you. Do not stand or sit up quickly, especially if you are an older patient. This reduces the risk of dizzy or fainting spells. Alcohol may interfere with the effect of this medicine. Avoid alcoholic drinks. If you have a heart condition, like congestive heart failure, and notice that you are retaining water and have swelling in your hands or feet, contact your health care provider immediately.  What side effects may I notice from receiving this medicine? Side effects that you should report to your doctor or health care professional as soon as possible and are very rare: -allergic reactions like skin rash, itching or hives, swelling of the  face, lips, or tongue -breathing problems -changes in vision -jerking or unusual movements of any part of your body -suicidal thoughts or other mood changes -swelling of the ankles, feet, hands -unusual bruising or bleeding  Side effects that usually do not require medical attention (Report these to your doctor or health care professional if they continue or are bothersome.): -dizziness -drowsiness -dry mouth -nausea -tremors  Piriformis Syndrome With Rehab Piriformis syndrome is a condition the affects the nervous system in the area of the hip, and is characterized by pain and possibly a loss of feeling in the backside (posterior) thigh that may extend down the entire length of the leg. The symptoms are caused by an increase in pressure on the sciatic nerve by the piriformis muscle, which is on the back of the hip and is responsible for externally rotating the hip. The sciatic nerve and its branches connect to much of the leg. Normally the sciatic nerve runs between the piriformis muscle and other muscles. However, in certain individuals the nerve runs through the muscle, which causes an increase in pressure on the nerve and results in the symptoms of piriformis syndrome. SYMPTOMS   Pain, tingling, numbness, or burning in the back of the thigh that may also extend down the entire leg.  Occasionally, tenderness in the buttock.  Loss of function of the leg.  Pain that worsens when using the piriformis  muscle (running, jumping, or stairs).  Pain that increases with prolonged sitting.  Pain that is lessened by lying flat on the back. CAUSES   Piriformis syndrome is the result of an increase in pressure placed on the sciatic nerve. Oftentimes, piriformis syndrome is an overuse injury.  Stress placed on the nerve from a sudden increase in the intensity, frequency, or duration of training.  Compensation of other extremity injuries. RISK INCREASES WITH:  Sports that involve the  piriformis muscle (running, walking, or jumping).  You are born with (congenital) a defect in which the sciatic nerve passes through the muscle. PREVENTION  Warm up and stretch properly before activity.  Allow for adequate recovery between workouts.  Maintain physical fitness:  Strength, flexibility, and endurance.  Cardiovascular fitness. PROGNOSIS  If treated properly, the symptoms of piriformis syndrome usually resolve in 2 to 6 weeks. RELATED COMPLICATIONS   Persistent and possibly permanent pain and numbness in the lower extremity.  Weakness of the extremity that may progress to disability and inability to compete. TREATMENT  The most effective treatment for piriformis syndrome is rest from any activities that aggravate the symptoms. Ice and pain medication may help reduce pain and inflammation. The use of strengthening and stretching exercises may help reduce pain with activity. These exercises may be performed at home or with a therapist. A referral to a therapist may be given for further evaluation and treatment, such as ultrasound. Corticosteroid injections may be given to reduce inflammation that is causing pressure to be placed on the sciatic nerve. If nonsurgical (conservative) treatment is unsuccessful, then surgery may be recommended.  MEDICATION   If pain medication is necessary, then nonsteroidal anti-inflammatory medications, such as aspirin and ibuprofen, or other minor pain relievers, such as acetaminophen, are often recommended.  Do not take pain medication for 7 days before surgery.  Prescription pain relievers may be given if deemed necessary by your caregiver. Use only as directed and only as much as you need.  Corticosteroid injections may be given by your caregiver. These injections should be reserved for the most serious cases, because they may only be given a certain number of times. HEAT AND COLD:   Cold treatment (icing) relieves pain and reduces  inflammation. Cold treatment should be applied for 10 to 15 minutes every 2 to 3 hours for inflammation and pain and immediately after any activity that aggravates your symptoms. Use ice packs or massage the area with a piece of ice (ice massage).  Heat treatment may be used prior to performing the stretching and strengthening activities prescribed by your caregiver, physical therapist, or athletic trainer. Use a heat pack or soak the injury in warm water. SEEK IMMEDIATE MEDICAL CARE IF:  Treatment seems to offer no benefit, or the condition worsens.  Any medications produce adverse side effects. EXERCISES RANGE OF MOTION (ROM) AND STRETCHING EXERCISES - Piriformis Syndrome These exercises may help you when beginning to rehabilitate your injury. Your symptoms may resolve with or without further involvement from your physician, physical therapist, or athletic trainer. While completing these exercises, remember:   Restoring tissue flexibility helps normal motion to return to the joints. This allows healthier, less painful movement and activity.  An effective stretch should be held for at least 30 seconds.  A stretch should never be painful. You should only feel a gentle lengthening or release in the stretched tissue. STRETCH - Hip Rotators  Lie on your back on a firm surface. Grasp your right / left knee with  your right / left hand and your ankle with your opposite hand.  Keeping your hips and shoulders firmly planted, gently pull your right / left knee and rotate your lower leg toward your opposite shoulder until you feel a stretch in your buttocks.  Hold this stretch for __________ seconds. Repeat this stretch __________ times. Complete this stretch __________ times per day. STRETCH - Iliotibial Band  On the floor or bed, lie on your side so your right / left leg is on top. Bend your knee and grab your ankle.  Slowly bring your knee back so that your thigh is in line with your trunk.  Keep your heel at your buttocks and gently arch your back so your head, shoulders, and hips line up.  Slowly lower your leg so that your knee approaches the floor/bed until you feel a gentle stretch on the outside of your right / left thigh. If you do not feel a stretch and your knee will not fall farther, place the heel of your opposite foot on top of your knee and pull your thigh down farther.  Hold this stretch for __________ seconds. Repeat __________ times. Complete __________ times per day. STRENGTHENING EXERCISES - Piriformis Syndrome  These are some of the caregiver again or until your symptoms are resolved. Remember:   Strong muscles with good endurance tolerate stress better.  Do the exercises as initially prescribed by your caregiver. Progress slowly with each exercise, gradually increasing the number of repetitions and weight used under their guidance. STRENGTH - Hip Abductors, Straight Leg Raises Be aware of your form throughout the entire exercise so that you exercise the correct muscles. Sloppy form means that you are not strengthening the correct muscles.  Lie on your side so that your head, shoulders, knee, and hip line up. You may bend your lower knee to help maintain your balance. Your right / left leg should be on top.  Roll your hips slightly forward, so that your hips are stacked directly over each other and your right / left knee is facing forward.  Lift your top leg up 4-6 inches, leading with your heel. Be sure that your foot does not drift forward or that your knee does not roll toward the ceiling.  Hold this position for __________ seconds. You should feel the muscles in your outer hip lifting (you may not notice this until your leg begins to tire).  Slowly lower your leg to the starting position. Allow the muscles to fully relax before beginning the next repetition. Repeat __________ times. Complete this exercise __________ times per day.  STRENGTH - Hip  Abductors, Quadruped  On a firm, lightly padded surface, position yourself on your hands and knees. Your hands should be directly below your shoulders and your knees should be directly below your hips.  Keeping your right / left knee bent, lift your leg out to the side. Keep your legs level and in line with your shoulders.  Position yourself on your hands and knees.  Hold for __________ seconds.  Keeping your trunk steady and your hips level, slowly lower your leg to the starting position. Repeat __________ times. Complete this exercise __________ times per day.  STRENGTH - Hip Abductors, Standing  Tie one end of a rubber exercise band/tubing to a secure surface (table, pole) and tie a loop at the other end.  Place the loop around your right / left ankle. Keeping your ankle with the band directly opposite of the secured end, step away until  there is tension in the tube/band.  Hold onto a chair as needed for balance.  Keeping your back upright, your shoulders over your hips, and your toes pointing forward, lift your right / left leg out to your side. Be sure to lift your leg with your hip muscles. Do not "throw" your leg or tip your body to lift your leg.  Slowly and with control, return to the starting position. Repeat exercise __________ times. Complete this exercise __________ times per day.    This information is not intended to replace advice given to you by your health care provider. Make sure you discuss any questions you have with your health care provider.   Document Released: 11/13/2005 Document Revised: 03/30/2015 Document Reviewed: 02/25/2009 Elsevier Interactive Patient Education [employer].

## 2015-10-15 ENCOUNTER — Encounter: Payer: Self-pay | Admitting: Physician Assistant

## 2015-10-25 ENCOUNTER — Encounter: Payer: Self-pay | Admitting: Physician Assistant

## 2015-10-26 ENCOUNTER — Encounter: Payer: Self-pay | Admitting: Physician Assistant

## 2015-10-26 MED ORDER — PREGABALIN 75 MG PO CAPS: ORAL_CAPSULE | ORAL | Status: DC

## 2015-10-27 ENCOUNTER — Encounter: Payer: Self-pay | Admitting: Physician Assistant

## 2015-11-11 ENCOUNTER — Ambulatory Visit: Payer: Self-pay | Admitting: Internal Medicine

## 2015-11-11 ENCOUNTER — Ambulatory Visit: Payer: Federal, State, Local not specified - PPO

## 2015-11-15 ENCOUNTER — Ambulatory Visit: Payer: Self-pay | Admitting: Internal Medicine

## 2015-11-18 ENCOUNTER — Other Ambulatory Visit: Payer: Self-pay | Admitting: Internal Medicine

## 2015-11-25 ENCOUNTER — Encounter: Payer: Self-pay | Admitting: Internal Medicine

## 2015-12-02 ENCOUNTER — Encounter: Payer: Federal, State, Local not specified - PPO | Admitting: Gynecology

## 2015-12-06 ENCOUNTER — Other Ambulatory Visit: Payer: Self-pay | Admitting: Internal Medicine

## 2015-12-09 ENCOUNTER — Ambulatory Visit (INDEPENDENT_AMBULATORY_CARE_PROVIDER_SITE_OTHER): Payer: Federal, State, Local not specified - PPO | Admitting: Internal Medicine

## 2015-12-09 ENCOUNTER — Ambulatory Visit: Payer: Federal, State, Local not specified - PPO

## 2015-12-09 ENCOUNTER — Encounter: Payer: Self-pay | Admitting: Internal Medicine

## 2015-12-09 VITALS — BP 106/70 | HR 72 | Temp 98.0°F | Resp 16 | Ht 65.0 in | Wt 185.0 lb

## 2015-12-09 DIAGNOSIS — J069 Acute upper respiratory infection, unspecified: Secondary | ICD-10-CM | POA: Diagnosis not present

## 2015-12-09 MED ORDER — PREDNISONE 20 MG PO TABS: ORAL_TABLET | ORAL | Status: DC

## 2015-12-09 MED ORDER — AZITHROMYCIN 250 MG PO TABS: ORAL_TABLET | ORAL | Status: DC

## 2015-12-09 MED ORDER — BENZONATATE 200 MG PO CAPS: 200.0000 mg | ORAL_CAPSULE | Freq: Three times a day (TID) | ORAL | Status: DC | PRN

## 2015-12-09 MED ORDER — IPRATROPIUM BROMIDE 0.03 % NA SOLN: 2.0000 | Freq: Three times a day (TID) | NASAL | Status: DC

## 2015-12-09 NOTE — Progress Notes (Signed)
Patient ID: Christine Frank, female   DOB: 1966/10/19, 50 y.o.   MRN: [id number]  HPI  Patient presents to the office for evaluation of cough.  It has been going on for 2 days.  Patient reports minimal cough.  They also endorse change in voice, chills, postnasal drip and facial pain, teeth pain, sinus pressure, headaches, sore throat..  They have tried sudafed yesterday and helped temporarily.  They report that nothing has worked.  They admits to other sick contacts.  Her oldest son has PNA currently.  Review of Systems  Constitutional: Positive for chills and malaise/fatigue. Negative for fever.  HENT: Positive for congestion and sore throat. Negative for ear pain.   Eyes: Negative.   Respiratory: Positive for cough. Negative for sputum production, shortness of breath and wheezing.   Cardiovascular: Negative for chest pain, palpitations and leg swelling.  Neurological: Positive for headaches.    PE:  Filed Vitals:   12/09/15 1030  BP: 106/70  Pulse: 72  Temp: 98 F (36.7 C)  Resp: 16   General:  Alert and non-toxic, WDWN, NAD HEENT: NCAT, PERLA, EOM normal, no occular discharge or erythema.  Nasal mucosal edema with sinus tenderness to palpation.  Oropharynx clear with minimal oropharyngeal edema and erythema.  Mucous membranes moist and pink. Neck:  Cervical adenopathy Chest:  RRR no MRGs.  Lungs clear to auscultation A&P with no wheezes rhonchi or rales.   Abdomen: +BS x 4 quadrants, soft, non-tender, no guarding, rigidity, or rebound. Skin: warm and dry no rash Neuro: A&Ox4, CN II-XII grossly intact  Assessment and Plan:   1. Acute URI -antihistamine -nasal saline - predniSONE (DELTASONE) 20 MG tablet; 3 tabs po daily x 3 days, then 2 tabs x 3 days, then 1.5 tabs x 3 days, then 1 tab x 3 days, then 0.5 tabs x 3 days  Dispense: 27 tablet; Refill: 0 - azithromycin (ZITHROMAX Z-PAK) 250 MG tablet; 2 po day one, then 1 daily x 4 days  Dispense: 6 tablet; Refill: 0 -  ipratropium (ATROVENT) 0.03 % nasal spray; Place 2 sprays into the nose 3 (three) times daily.  Dispense: 30 mL; Refill: 2 - benzonatate (TESSALON) 200 MG capsule; Take 1 capsule (200 mg total) by mouth 3 (three) times daily as needed for cough.  Dispense: 30 capsule; Refill: 1

## 2015-12-09 NOTE — Patient Instructions (Signed)
Please take claritin, zyrtec, or allegra daily to help dry up congestion.  Please use flonase nightly.  You can use atrovent spray up to 3 times daily to help dry you out.  Please finish prednisone  Please hold zpak for 3 days.  If you develop fevers, have brown or yellow sputum coming out of your nose or get signficantly worse take the medication.    Please use saline as often as tolerated.  Please call the office if breathing gets worse.

## 2015-12-13 ENCOUNTER — Other Ambulatory Visit: Payer: Self-pay | Admitting: Internal Medicine

## 2015-12-23 ENCOUNTER — Ambulatory Visit: Payer: Federal, State, Local not specified - PPO

## 2016-01-11 ENCOUNTER — Other Ambulatory Visit: Payer: Self-pay | Admitting: Physician Assistant

## 2016-01-11 ENCOUNTER — Other Ambulatory Visit: Payer: Self-pay | Admitting: Internal Medicine

## 2016-01-13 ENCOUNTER — Ambulatory Visit: Payer: Federal, State, Local not specified - PPO

## 2016-01-20 ENCOUNTER — Encounter: Payer: Federal, State, Local not specified - PPO | Admitting: Gynecology

## 2016-01-22 ENCOUNTER — Other Ambulatory Visit: Payer: Self-pay | Admitting: Internal Medicine

## 2016-01-27 ENCOUNTER — Encounter: Payer: Self-pay | Admitting: Physician Assistant

## 2016-01-28 ENCOUNTER — Ambulatory Visit: Payer: Federal, State, Local not specified - PPO

## 2016-02-03 ENCOUNTER — Ambulatory Visit: Payer: Federal, State, Local not specified - PPO

## 2016-02-09 ENCOUNTER — Other Ambulatory Visit: Payer: Self-pay | Admitting: Physician Assistant

## 2016-02-13 ENCOUNTER — Other Ambulatory Visit: Payer: Self-pay | Admitting: Physician Assistant

## 2016-02-15 ENCOUNTER — Telehealth: Payer: Self-pay | Admitting: *Deleted

## 2016-02-15 NOTE — Telephone Encounter (Signed)
Patient called and states she is out of her Lorazopam and Lyrica that she takes for restless legs.  Per Dr Maurice March, the patient needs an OV to discuss her meds.  The patient was upset but agreed to come for an OV with Dr Melford Aase.  She was upset because she and her husband have only seen the PA's the past few appointments.  She scheduled an appointment with Dr Melford Aase on 02/17/2016.

## 2016-02-17 ENCOUNTER — Ambulatory Visit: Payer: Self-pay | Admitting: Internal Medicine

## 2016-02-24 ENCOUNTER — Ambulatory Visit
Admission: RE | Admit: 2016-02-24 | Discharge: 2016-02-24 | Disposition: A | Discharge status: Home / Self Care | Payer: Federal, State, Local not specified - PPO | Source: Ambulatory Visit

## 2016-02-24 ENCOUNTER — Ambulatory Visit (INDEPENDENT_AMBULATORY_CARE_PROVIDER_SITE_OTHER): Payer: Federal, State, Local not specified - PPO | Admitting: Gynecology

## 2016-02-24 ENCOUNTER — Encounter: Payer: Self-pay | Admitting: Gynecology

## 2016-02-24 VITALS — BP 120/74 | Ht 65.0 in | Wt 180.0 lb

## 2016-02-24 DIAGNOSIS — Z13220 Encounter for screening for lipoid disorders: Secondary | ICD-10-CM

## 2016-02-24 DIAGNOSIS — Z1329 Encounter for screening for other suspected endocrine disorder: Secondary | ICD-10-CM | POA: Diagnosis not present

## 2016-02-24 DIAGNOSIS — G2581 Restless legs syndrome: Secondary | ICD-10-CM | POA: Diagnosis not present

## 2016-02-24 DIAGNOSIS — Z1231 Encounter for screening mammogram for malignant neoplasm of breast: Secondary | ICD-10-CM

## 2016-02-24 DIAGNOSIS — N951 Menopausal and female climacteric states: Secondary | ICD-10-CM | POA: Diagnosis not present

## 2016-02-24 DIAGNOSIS — Z01419 Encounter for gynecological examination (general) (routine) without abnormal findings: Secondary | ICD-10-CM

## 2016-02-24 MED ORDER — BUTALBITAL-ASPIRIN-CAFFEINE 50-325-40 MG PO CAPS: ORAL_CAPSULE | ORAL | Status: DC

## 2016-02-24 MED ORDER — DULOXETINE HCL 60 MG PO CPEP: ORAL_CAPSULE | ORAL | Status: DC

## 2016-02-24 MED ORDER — LORAZEPAM 1 MG PO TABS: 1.0000 mg | ORAL_TABLET | Freq: Three times a day (TID) | ORAL | Status: DC | PRN

## 2016-02-24 MED ORDER — PREGABALIN 75 MG PO CAPS: ORAL_CAPSULE | ORAL | Status: DC

## 2016-02-24 NOTE — Progress Notes (Signed)
    Christine Frank Apr 19, 1966 [id number]        50 y.o.  [id number]  for annual exam.  Several issues noted below.  Past medical history,surgical history, problem list, medications, allergies, family history and social history were all reviewed and documented as reviewed in the EPIC chart.  ROS:  Performed with pertinent positives and negatives included in the history, assessment and plan.   Additional significant findings :  none   Exam: Caryn Bee assistant Filed Vitals:   02/24/16 1412  BP: 120/74  Height: 5\' 5"  (1.651 m)  Weight: 180 lb (81.647 kg)   General appearance:  Normal affect, orientation and appearance. Skin: Grossly normal HEENT: Without gross lesions.  No cervical or supraclavicular adenopathy. Thyroid normal.  Lungs:  Clear without wheezing, rales or rhonchi Cardiac: RR, without RMG Abdominal:  Soft, nontender, without masses, guarding, rebound, organomegaly or hernia Breasts:  Examined lying and sitting without masses, retractions, discharge or axillary adenopathy. Pelvic:  Ext/BUS/vagina normal  Cervix normal  Uterus anteverted, normal size, shape and contour, midline and mobile nontender   Adnexa without masses or tenderness    Anus and perineum normal   Rectovaginal normal sphincter tone without palpated masses or tenderness.    Assessment/Plan:  50 y.o. [id number] female for annual exam with mild irregular menses, condom contraception.   1. Contraception. Patient continues with condoms and understands the failure risks with this and declines alternatives. Availability of plan B have been discussed. 2. Mild menstrual irregularity. Menses are coming a little earlier or later but still occurring monthly. Having some hot flashes and night sweats. OTC products such as soy based discussed. HRT also reviewed and at this point the patient does not feel she wants to consider HRT. I did review the studies risks to include increased risk of stroke heart attack DVT  and breast cancer. She will keep a menstrual calendar as long as less frequent but regular menses we will follow. If significant irregularity or atypical bleeding or significant menopausal symptoms she'll represent for evaluation and treatment. 3. Having some headaches particularly when she has hot flushes. Migraine in nature. Is using Fiorinal with good results. Refill provided. 4. PMDD. Had been on Lexapro but was switched to Cymbalta by her other physician as it was felt that this may also help with her restless leg syndrome. She is doing well on this and I refilled her times a year. 5. Restless leg syndrome. Using Lyrica with occasional Ativan if she has a breakthrough. Was started by her other physician on this regiment and is doing well. She asked if I could refill her medications which I did. 6. Mammography 01/2016. Continue with annual mammography when due. SBE monthly reviewed. 7. Pap smear/HPV 08/2014 normal. No Pap smear done today. No history of significant abnormal Pap smears previously. 8. Colonoscopy never. I reviewed current recommendations and patient plans to schedule sometime this year. 9. Health maintenance. No routine lab work done. In review of her record she's had a CBC, CMP, thyroid done within the past year. Also had a lipid profile which was normal excepting LDL of 101 about a year and a half ago.  Will check baseline UA. Follow up in one year, sooner as needed.   Anastasio Auerbach MD, 2:37 PM 02/24/2016

## 2016-02-24 NOTE — Patient Instructions (Signed)
Schedule your colonoscopy with either:  [facility]   Address: [address], [place], [place] [address]  [phone]    or  [facility]  Address: [address], [place], [place] [address]  [phone]      

## 2016-02-25 ENCOUNTER — Telehealth: Payer: Self-pay | Admitting: *Deleted

## 2016-02-25 LAB — URINALYSIS W MICROSCOPIC + REFLEX CULTURE
BACTERIA UA: NONE SEEN [HPF]
Bilirubin Urine: NEGATIVE
CRYSTALS: NONE SEEN [HPF]
Casts: NONE SEEN [LPF]
GLUCOSE, UA: NEGATIVE
HGB URINE DIPSTICK: NEGATIVE
Ketones, ur: NEGATIVE
LEUKOCYTES UA: NEGATIVE
Nitrite: NEGATIVE
PROTEIN: NEGATIVE
RBC / HPF: NONE SEEN RBC/HPF (ref <=2)
SQUAMOUS EPITHELIAL / LPF: NONE SEEN [HPF] (ref <=5)
Specific Gravity, Urine: 1.002 (ref 1.001–1.035)
WBC, UA: NONE SEEN WBC/HPF (ref <=5)
YEAST: NONE SEEN [HPF]
pH: 6 (ref 5.0–8.0)

## 2016-02-25 NOTE — Telephone Encounter (Signed)
Ebony Hail from [address]. Said pt dropped all 3 of the Rx from OV today except for the Fiorinal 50-325-40 mg capsules, Ebony Hail could see where the Rx had been tore off. She asked if I wanted to call Rx in for pt (Rx was printed and given to pt by provider at office visit) I told allison I won't call Rx in that maybe pt is getting filled elsewhere. However the pharmacist will fill the remaining 3 Rx that was bought in such as the Cymbalta 60 mg and Ativan 1 mg, lyrica 75 mg.

## 2016-02-29 ENCOUNTER — Encounter: Payer: Self-pay | Admitting: Physician Assistant

## 2016-03-01 ENCOUNTER — Other Ambulatory Visit: Payer: Self-pay | Admitting: Physician Assistant

## 2016-03-01 DIAGNOSIS — M5441 Lumbago with sciatica, right side: Secondary | ICD-10-CM

## 2016-03-01 DIAGNOSIS — M5442 Lumbago with sciatica, left side: Principal | ICD-10-CM

## 2016-03-08 ENCOUNTER — Other Ambulatory Visit: Payer: Self-pay | Admitting: Internal Medicine

## 2016-03-08 MED ORDER — CETIRIZINE HCL 10 MG PO CHEW: 10.0000 mg | CHEWABLE_TABLET | Freq: Every day | ORAL | Status: DC

## 2016-03-09 ENCOUNTER — Other Ambulatory Visit: Payer: Self-pay | Admitting: Internal Medicine

## 2016-03-09 ENCOUNTER — Encounter: Payer: Self-pay | Admitting: Physician Assistant

## 2016-03-09 MED ORDER — CETIRIZINE HCL 10 MG PO TABS: ORAL_TABLET | ORAL | Status: DC

## 2016-04-06 DIAGNOSIS — G894 Chronic pain syndrome: Secondary | ICD-10-CM | POA: Diagnosis not present

## 2016-04-06 DIAGNOSIS — Z79899 Other long term (current) drug therapy: Secondary | ICD-10-CM | POA: Diagnosis not present

## 2016-04-06 DIAGNOSIS — M545 Low back pain: Secondary | ICD-10-CM | POA: Diagnosis not present

## 2016-04-06 DIAGNOSIS — Z79891 Long term (current) use of opiate analgesic: Secondary | ICD-10-CM | POA: Diagnosis not present

## 2016-04-06 DIAGNOSIS — M79606 Pain in leg, unspecified: Secondary | ICD-10-CM | POA: Diagnosis not present

## 2016-04-13 DIAGNOSIS — Z79899 Other long term (current) drug therapy: Secondary | ICD-10-CM | POA: Diagnosis not present

## 2016-04-13 DIAGNOSIS — M545 Low back pain: Secondary | ICD-10-CM | POA: Diagnosis not present

## 2016-04-13 DIAGNOSIS — G2581 Restless legs syndrome: Secondary | ICD-10-CM | POA: Diagnosis not present

## 2016-04-13 DIAGNOSIS — M79606 Pain in leg, unspecified: Secondary | ICD-10-CM | POA: Diagnosis not present

## 2016-04-13 DIAGNOSIS — Z79891 Long term (current) use of opiate analgesic: Secondary | ICD-10-CM | POA: Diagnosis not present

## 2016-04-13 DIAGNOSIS — G894 Chronic pain syndrome: Secondary | ICD-10-CM | POA: Diagnosis not present

## 2016-04-20 DIAGNOSIS — K080 Exfoliation of teeth due to systemic causes: Secondary | ICD-10-CM | POA: Diagnosis not present

## 2016-05-02 ENCOUNTER — Other Ambulatory Visit: Payer: Self-pay | Admitting: Internal Medicine

## 2016-05-03 ENCOUNTER — Other Ambulatory Visit: Payer: Self-pay | Admitting: Gynecology

## 2016-05-03 NOTE — Telephone Encounter (Signed)
Called into pharmacy

## 2016-05-04 DIAGNOSIS — M545 Low back pain: Secondary | ICD-10-CM | POA: Diagnosis not present

## 2016-05-09 DIAGNOSIS — M545 Low back pain: Secondary | ICD-10-CM | POA: Diagnosis not present

## 2016-05-09 DIAGNOSIS — M79609 Pain in unspecified limb: Secondary | ICD-10-CM | POA: Diagnosis not present

## 2016-05-11 DIAGNOSIS — M79606 Pain in leg, unspecified: Secondary | ICD-10-CM | POA: Diagnosis not present

## 2016-05-11 DIAGNOSIS — Z79891 Long term (current) use of opiate analgesic: Secondary | ICD-10-CM | POA: Diagnosis not present

## 2016-05-11 DIAGNOSIS — M545 Low back pain: Secondary | ICD-10-CM | POA: Diagnosis not present

## 2016-05-11 DIAGNOSIS — Z79899 Other long term (current) drug therapy: Secondary | ICD-10-CM | POA: Diagnosis not present

## 2016-05-11 DIAGNOSIS — G2581 Restless legs syndrome: Secondary | ICD-10-CM | POA: Diagnosis not present

## 2016-05-11 DIAGNOSIS — G894 Chronic pain syndrome: Secondary | ICD-10-CM | POA: Diagnosis not present

## 2016-06-01 ENCOUNTER — Other Ambulatory Visit: Payer: Self-pay | Admitting: Gynecology

## 2016-06-07 DIAGNOSIS — M545 Low back pain: Secondary | ICD-10-CM | POA: Diagnosis not present

## 2016-06-12 DIAGNOSIS — Z79899 Other long term (current) drug therapy: Secondary | ICD-10-CM | POA: Diagnosis not present

## 2016-06-12 DIAGNOSIS — M545 Low back pain: Secondary | ICD-10-CM | POA: Diagnosis not present

## 2016-06-12 DIAGNOSIS — G2581 Restless legs syndrome: Secondary | ICD-10-CM | POA: Diagnosis not present

## 2016-06-12 DIAGNOSIS — G894 Chronic pain syndrome: Secondary | ICD-10-CM | POA: Diagnosis not present

## 2016-06-12 DIAGNOSIS — Z79891 Long term (current) use of opiate analgesic: Secondary | ICD-10-CM | POA: Diagnosis not present

## 2016-06-12 DIAGNOSIS — M79606 Pain in leg, unspecified: Secondary | ICD-10-CM | POA: Diagnosis not present

## 2016-06-13 NOTE — Telephone Encounter (Signed)
Prescription called in

## 2016-07-10 DIAGNOSIS — G894 Chronic pain syndrome: Secondary | ICD-10-CM | POA: Diagnosis not present

## 2016-07-10 DIAGNOSIS — M545 Low back pain: Secondary | ICD-10-CM | POA: Diagnosis not present

## 2016-07-10 DIAGNOSIS — Z79899 Other long term (current) drug therapy: Secondary | ICD-10-CM | POA: Diagnosis not present

## 2016-07-10 DIAGNOSIS — Z79891 Long term (current) use of opiate analgesic: Secondary | ICD-10-CM | POA: Diagnosis not present

## 2016-07-10 DIAGNOSIS — M79606 Pain in leg, unspecified: Secondary | ICD-10-CM | POA: Diagnosis not present

## 2016-07-10 DIAGNOSIS — G2581 Restless legs syndrome: Secondary | ICD-10-CM | POA: Diagnosis not present

## 2016-07-11 DIAGNOSIS — J029 Acute pharyngitis, unspecified: Secondary | ICD-10-CM | POA: Diagnosis not present

## 2016-07-13 ENCOUNTER — Other Ambulatory Visit: Payer: Self-pay | Admitting: Gynecology

## 2016-07-14 ENCOUNTER — Other Ambulatory Visit: Payer: Self-pay | Admitting: Internal Medicine

## 2016-07-17 ENCOUNTER — Other Ambulatory Visit: Payer: Self-pay | Admitting: Internal Medicine

## 2016-08-10 DIAGNOSIS — M545 Low back pain: Secondary | ICD-10-CM | POA: Diagnosis not present

## 2016-08-10 DIAGNOSIS — G894 Chronic pain syndrome: Secondary | ICD-10-CM | POA: Diagnosis not present

## 2016-08-10 DIAGNOSIS — Z79891 Long term (current) use of opiate analgesic: Secondary | ICD-10-CM | POA: Diagnosis not present

## 2016-08-10 DIAGNOSIS — Z79899 Other long term (current) drug therapy: Secondary | ICD-10-CM | POA: Diagnosis not present

## 2016-08-12 ENCOUNTER — Other Ambulatory Visit: Payer: Self-pay | Admitting: Internal Medicine

## 2016-08-16 ENCOUNTER — Other Ambulatory Visit: Payer: Self-pay

## 2016-08-16 MED ORDER — DULOXETINE HCL 60 MG PO CPEP: ORAL_CAPSULE | ORAL | 1 refills | Status: DC

## 2016-08-27 ENCOUNTER — Other Ambulatory Visit: Payer: Self-pay | Admitting: Internal Medicine

## 2016-08-27 DIAGNOSIS — J069 Acute upper respiratory infection, unspecified: Secondary | ICD-10-CM

## 2016-08-28 ENCOUNTER — Other Ambulatory Visit: Payer: Self-pay | Admitting: Internal Medicine

## 2016-08-28 ENCOUNTER — Other Ambulatory Visit: Payer: Self-pay | Admitting: Gynecology

## 2016-08-29 MED ORDER — BUTALBITAL-APAP-CAFFEINE 50-325-40 MG PO CAPS: ORAL_CAPSULE | ORAL | 0 refills | Status: DC

## 2016-08-29 NOTE — Telephone Encounter (Signed)
Rx called in, no refills given on Rx per TF response

## 2016-08-29 NOTE — Telephone Encounter (Signed)
Okay to refill #40 with no refill

## 2016-09-07 DIAGNOSIS — G894 Chronic pain syndrome: Secondary | ICD-10-CM | POA: Diagnosis not present

## 2016-09-07 DIAGNOSIS — Z79899 Other long term (current) drug therapy: Secondary | ICD-10-CM | POA: Diagnosis not present

## 2016-09-07 DIAGNOSIS — Z79891 Long term (current) use of opiate analgesic: Secondary | ICD-10-CM | POA: Diagnosis not present

## 2016-09-07 DIAGNOSIS — M79606 Pain in leg, unspecified: Secondary | ICD-10-CM | POA: Diagnosis not present

## 2016-09-07 DIAGNOSIS — G2581 Restless legs syndrome: Secondary | ICD-10-CM | POA: Diagnosis not present

## 2016-09-16 ENCOUNTER — Other Ambulatory Visit: Payer: Self-pay | Admitting: Internal Medicine

## 2016-09-22 DIAGNOSIS — K080 Exfoliation of teeth due to systemic causes: Secondary | ICD-10-CM | POA: Diagnosis not present

## 2016-10-05 DIAGNOSIS — Z79891 Long term (current) use of opiate analgesic: Secondary | ICD-10-CM | POA: Diagnosis not present

## 2016-10-05 DIAGNOSIS — G2581 Restless legs syndrome: Secondary | ICD-10-CM | POA: Diagnosis not present

## 2016-10-05 DIAGNOSIS — G894 Chronic pain syndrome: Secondary | ICD-10-CM | POA: Diagnosis not present

## 2016-10-05 DIAGNOSIS — M79606 Pain in leg, unspecified: Secondary | ICD-10-CM | POA: Diagnosis not present

## 2016-10-05 DIAGNOSIS — Z79899 Other long term (current) drug therapy: Secondary | ICD-10-CM | POA: Diagnosis not present

## 2016-10-05 DIAGNOSIS — M545 Low back pain: Secondary | ICD-10-CM | POA: Diagnosis not present

## 2016-11-01 DIAGNOSIS — M545 Low back pain: Secondary | ICD-10-CM | POA: Diagnosis not present

## 2016-11-01 DIAGNOSIS — Z79899 Other long term (current) drug therapy: Secondary | ICD-10-CM | POA: Diagnosis not present

## 2016-11-01 DIAGNOSIS — M79606 Pain in leg, unspecified: Secondary | ICD-10-CM | POA: Diagnosis not present

## 2016-11-01 DIAGNOSIS — Z79891 Long term (current) use of opiate analgesic: Secondary | ICD-10-CM | POA: Diagnosis not present

## 2016-11-01 DIAGNOSIS — G894 Chronic pain syndrome: Secondary | ICD-10-CM | POA: Diagnosis not present

## 2016-11-01 DIAGNOSIS — G2581 Restless legs syndrome: Secondary | ICD-10-CM | POA: Diagnosis not present

## 2016-11-08 DIAGNOSIS — M545 Low back pain: Secondary | ICD-10-CM | POA: Diagnosis not present

## 2016-11-09 ENCOUNTER — Other Ambulatory Visit: Payer: Self-pay | Admitting: Internal Medicine

## 2016-11-10 ENCOUNTER — Other Ambulatory Visit: Payer: Self-pay | Admitting: Internal Medicine

## 2016-11-30 DIAGNOSIS — Z79891 Long term (current) use of opiate analgesic: Secondary | ICD-10-CM | POA: Diagnosis not present

## 2016-11-30 DIAGNOSIS — M79606 Pain in leg, unspecified: Secondary | ICD-10-CM | POA: Diagnosis not present

## 2016-11-30 DIAGNOSIS — G894 Chronic pain syndrome: Secondary | ICD-10-CM | POA: Diagnosis not present

## 2016-11-30 DIAGNOSIS — G2581 Restless legs syndrome: Secondary | ICD-10-CM | POA: Diagnosis not present

## 2016-11-30 DIAGNOSIS — M545 Low back pain: Secondary | ICD-10-CM | POA: Diagnosis not present

## 2016-11-30 DIAGNOSIS — Z79899 Other long term (current) drug therapy: Secondary | ICD-10-CM | POA: Diagnosis not present

## 2016-12-06 DIAGNOSIS — E119 Type 2 diabetes mellitus without complications: Secondary | ICD-10-CM | POA: Diagnosis not present

## 2016-12-06 DIAGNOSIS — Z23 Encounter for immunization: Secondary | ICD-10-CM | POA: Diagnosis not present

## 2016-12-06 DIAGNOSIS — R7303 Prediabetes: Secondary | ICD-10-CM | POA: Diagnosis not present

## 2016-12-06 DIAGNOSIS — I10 Essential (primary) hypertension: Secondary | ICD-10-CM | POA: Diagnosis not present

## 2016-12-07 ENCOUNTER — Encounter: Payer: Self-pay | Admitting: Adult Health

## 2016-12-07 ENCOUNTER — Ambulatory Visit (INDEPENDENT_AMBULATORY_CARE_PROVIDER_SITE_OTHER): Payer: Federal, State, Local not specified - PPO | Admitting: Adult Health

## 2016-12-07 VITALS — BP 124/80 | HR 94 | Ht 64.0 in | Wt 191.1 lb

## 2016-12-07 DIAGNOSIS — Z23 Encounter for immunization: Secondary | ICD-10-CM | POA: Diagnosis not present

## 2016-12-07 DIAGNOSIS — F411 Generalized anxiety disorder: Secondary | ICD-10-CM

## 2016-12-07 DIAGNOSIS — G4700 Insomnia, unspecified: Secondary | ICD-10-CM

## 2016-12-07 DIAGNOSIS — M5442 Lumbago with sciatica, left side: Secondary | ICD-10-CM

## 2016-12-07 DIAGNOSIS — M545 Low back pain: Secondary | ICD-10-CM | POA: Diagnosis not present

## 2016-12-07 DIAGNOSIS — R5383 Other fatigue: Secondary | ICD-10-CM | POA: Diagnosis not present

## 2016-12-07 DIAGNOSIS — G8929 Other chronic pain: Secondary | ICD-10-CM

## 2016-12-07 DIAGNOSIS — Z1211 Encounter for screening for malignant neoplasm of colon: Secondary | ICD-10-CM

## 2016-12-07 DIAGNOSIS — G609 Hereditary and idiopathic neuropathy, unspecified: Secondary | ICD-10-CM

## 2016-12-07 MED ORDER — LORAZEPAM 1 MG PO TABS: ORAL_TABLET | ORAL | 0 refills | Status: DC

## 2016-12-07 NOTE — Progress Notes (Signed)
   Subjective:    Patient ID: Christine Frank, female    DOB: 01/13/1966, 51 y.o.   MRN: [id number]  HPI:  Ms. Christine Frank is here to establish as a new patient.  Last visit with healthcare provider was her OB/GYN March 2017 and monthly visits at "[facility]" with Dr. Andree Elk (low back pain with sciatica-began with pain clinic May 2017).  She reports fatigue and insomnia that has been worsening for the last year, which she believes is r/t to new onset menopause.      Review of Systems  Constitutional: Positive for fatigue. Negative for fever and unexpected weight change.  HENT: Negative for congestion and trouble swallowing.   Respiratory: Negative for cough, chest tightness, shortness of breath and wheezing.   Cardiovascular: Negative for chest pain, palpitations and leg swelling.  Gastrointestinal: Negative for diarrhea and nausea.  Endocrine: Negative for cold intolerance, heat intolerance, polydipsia, polyphagia and polyuria.  Genitourinary: Positive for menstrual problem. Negative for flank pain.  Musculoskeletal: Positive for arthralgias, back pain and myalgias. Negative for gait problem, joint swelling, neck pain and neck stiffness.  Skin: Negative for color change and pallor.  Neurological: Negative for dizziness, weakness and numbness.  Psychiatric/Behavioral: Positive for sleep disturbance. Negative for agitation and behavioral problems.       Objective:   Physical Exam  Constitutional: She is oriented to person, place, and time. She appears well-developed and well-nourished. No distress.  HENT:  Head: Normocephalic and atraumatic.  Right Ear: External ear normal.  Left Ear: External ear normal.  Eyes: Conjunctivae and EOM are normal. Pupils are equal, round, and reactive to light.  Neck: Normal range of motion. Neck supple. No thyromegaly present.  Cardiovascular: Normal rate, regular rhythm and intact distal pulses.   No murmur heard. Pulmonary/Chest:  Effort normal and breath sounds normal. No respiratory distress. She exhibits no tenderness.  Abdominal: Soft. Bowel sounds are normal.  Musculoskeletal: Normal range of motion. She exhibits tenderness.       Right shoulder: She exhibits tenderness.       Right hip: She exhibits tenderness.       Left hip: She exhibits tenderness.  Neurological: She is alert and oriented to person, place, and time. She has normal strength. She displays a negative Romberg sign.  Skin: Skin is warm, dry and intact. She is not diaphoretic.  Psychiatric: She has a normal mood and affect. Her speech is normal and behavior is normal. Judgment and thought content normal. Cognition and memory are normal.          Assessment & Plan:  1. Low back pain with left-sided sciatica-Continue with Pain Clinic.  Encourage to stretch nightly, recommended Chiropractic care.   2.  Neuropathy-continue with Pain Clinic. 3.  Anxiety-continue Rx. 4.  Fatigue-Labs drawn.  Increase fluids/eat well-balanced meals. 5.  Insomnia-Rx, practice good sleep hygiene.  RTC in 6 months/PRN.  All questions/concerns answered.  Merissa Renwick d.  Arvil Persons, NP-C

## 2016-12-07 NOTE — Patient Instructions (Addendum)
Back Pain, Adult Introduction Back pain is very common. The pain often gets better over time. The cause of back pain is usually not dangerous. Most people can learn to manage their back pain on their own. Follow these instructions at home: Watch your back pain for any changes. The following actions may help to lessen any pain you are feeling:  Stay active. Start with short walks on flat ground if you can. Try to walk farther each day.  Exercise regularly as told by your doctor. Exercise helps your back heal faster. It also helps avoid future injury by keeping your muscles strong and flexible.  Do not sit, drive, or stand in one place for more than 30 minutes.  Do not stay in bed. Resting more than 1-2 days can slow down your recovery.  Be careful when you bend or lift an object. Use good form when lifting:  Bend at your knees.  Keep the object close to your body.  Do not twist.  Sleep on a firm mattress. Lie on your side, and bend your knees. If you lie on your back, put a pillow under your knees.  Take medicines only as told by your doctor.  Put ice on the injured area.  Put ice in a plastic bag.  Place a towel between your skin and the bag.  Leave the ice on for 20 minutes, 2-3 times a day for the first 2-3 days. After that, you can switch between ice and heat packs.  Avoid feeling anxious or stressed. Find good ways to deal with stress, such as exercise.  Maintain a healthy weight. Extra weight puts stress on your back. Contact a doctor if:  You have pain that does not go away with rest or medicine.  You have worsening pain that goes down into your legs or buttocks.  You have pain that does not get better in one week.  You have pain at night.  You lose weight.  You have a fever or chills. Get help right away if:  You cannot control when you poop (bowel movement) or pee (urinate).  Your arms or legs feel weak.  Your arms or legs lose feeling  (numbness).  You feel sick to your stomach (nauseous) or throw up (vomit).  You have belly (abdominal) pain.  You feel like you may pass out (faint). This information is not intended to replace advice given to you by your health care provider. Make sure you discuss any questions you have with your health care provider. Document Released: 05/01/2008 Document Revised: 04/20/2016 Document Reviewed: 03/17/2014  2017 Elsevier   Insomnia Insomnia is a sleep disorder that makes it difficult to fall asleep or to stay asleep. Insomnia can cause tiredness (fatigue), low energy, difficulty concentrating, mood swings, and poor performance at work or school. There are three different ways to classify insomnia:  Difficulty falling asleep.  Difficulty staying asleep.  Waking up too early in the morning. Any type of insomnia can be long-term (chronic) or short-term (acute). Both are common. Short-term insomnia usually lasts for three months or less. Chronic insomnia occurs at least three times a week for longer than three months. What are the causes? Insomnia may be caused by another condition, situation, or substance, such as:  Anxiety.  Certain medicines.  Gastroesophageal reflux disease (GERD) or other gastrointestinal conditions.  Asthma or other breathing conditions.  Restless legs syndrome, sleep apnea, or other sleep disorders.  Chronic pain.  Menopause. This may include hot flashes.  Stroke.  Abuse of alcohol, tobacco, or illegal drugs.  Depression.  Caffeine.  Neurological disorders, such as Alzheimer disease.  An overactive thyroid (hyperthyroidism). The cause of insomnia may not be known. What increases the risk? Risk factors for insomnia include:  Gender. Women are more commonly affected than men.  Age. Insomnia is more common as you get older.  Stress. This may involve your professional or personal life.  Income. Insomnia is more common in people with lower  income.  Lack of exercise.  Irregular work schedule or night shifts.  Traveling between different time zones. What are the signs or symptoms? If you have insomnia, trouble falling asleep or trouble staying asleep is the main symptom. This may lead to other symptoms, such as:  Feeling fatigued.  Feeling nervous about going to sleep.  Not feeling rested in the morning.  Having trouble concentrating.  Feeling irritable, anxious, or depressed. How is this treated? Treatment for insomnia depends on the cause. If your insomnia is caused by an underlying condition, treatment will focus on addressing the condition. Treatment may also include:  Medicines to help you sleep.  Counseling or therapy.  Lifestyle adjustments. Follow these instructions at home:  Take medicines only as directed by your health care provider.  Keep regular sleeping and waking hours. Avoid naps.  Keep a sleep diary to help you and your health care provider figure out what could be causing your insomnia. Include:  When you sleep.  When you wake up during the night.  How well you sleep.  How rested you feel the next day.  Any side effects of medicines you are taking.  What you eat and drink.  Make your bedroom a comfortable place where it is easy to fall asleep:  Put up shades or special blackout curtains to block light from outside.  Use a white noise machine to block noise.  Keep the temperature cool.  Exercise regularly as directed by your health care provider. Avoid exercising right before bedtime.  Use relaxation techniques to manage stress. Ask your health care provider to suggest some techniques that may work well for you. These may include:  Breathing exercises.  Routines to release muscle tension.  Visualizing peaceful scenes.  Cut back on alcohol, caffeinated beverages, and cigarettes, especially close to bedtime. These can disrupt your sleep.  Do not overeat or eat spicy foods  right before bedtime. This can lead to digestive discomfort that can make it hard for you to sleep.  Limit screen use before bedtime. This includes:  Watching TV.  Using your smartphone, tablet, and computer.  Stick to a routine. This can help you fall asleep faster. Try to do a quiet activity, brush your teeth, and go to bed at the same time each night.  Get out of bed if you are still awake after 15 minutes of trying to sleep. Keep the lights down, but try reading or doing a quiet activity. When you feel sleepy, go back to bed.  Make sure that you drive carefully. Avoid driving if you feel very sleepy.  Keep all follow-up appointments as directed by your health care provider. This is important. Contact a health care provider if:  You are tired throughout the day or have trouble in your daily routine due to sleepiness.  You continue to have sleep problems or your sleep problems get worse. Get help right away if:  You have serious thoughts about hurting yourself or someone else. This information is not intended to replace advice given  to you by your health care provider. Make sure you discuss any questions you have with your health care provider. Document Released: 11/10/2000 Document Revised: 04/14/2016 Document Reviewed: 08/14/2014 Elsevier Interactive Patient Education  2017 Elsevier [employer]  Fatigue Introduction Fatigue is feeling tired all of the time, a lack of energy, or a lack of motivation. Occasional or mild fatigue is often a normal response to activity or life in general. However, long-lasting (chronic) or extreme fatigue may indicate an underlying medical condition. Follow these instructions at home: Watch your fatigue for any changes. The following actions may help to lessen any discomfort you are feeling:  Talk to your health care provider about how much sleep you need each night. Try to get the required amount every night.  Take medicines only as directed by your  health care provider.  Eat a healthy and nutritious diet. Ask your health care provider if you need help changing your diet.  Drink enough fluid to keep your urine clear or pale yellow.  Practice ways of relaxing, such as yoga, meditation, massage therapy, or acupuncture.  Exercise regularly.  Change situations that cause you stress. Try to keep your work and personal routine reasonable.  Do not abuse illegal drugs.  Limit alcohol intake to no more than 1 drink per day for nonpregnant women and 2 drinks per day for men. One drink equals 12 ounces of beer, 5 ounces of wine, or 1 ounces of hard liquor.  Take a multivitamin, if directed by your health care provider. Contact a health care provider if:  Your fatigue does not get better.  You have a fever.  You have unintentional weight loss or gain.  You have headaches.  You have difficulty:  Falling asleep.  Sleeping throughout the night.  You feel angry, guilty, anxious, or sad.  You are unable to have a bowel movement (constipation).  You skin is dry.  Your legs or another part of your body is swollen. Get help right away if:  You feel confused.  Your vision is blurry.  You feel faint or pass out.  You have a severe headache.  You have severe abdominal, pelvic, or back pain.  You have chest pain, shortness of breath, or an irregular or fast heartbeat.  You are unable to urinate or you urinate less than normal.  You develop abnormal bleeding, such as bleeding from the rectum, vagina, nose, lungs, or nipples.  You vomit blood.  You have thoughts about harming yourself or committing suicide.  You are worried that you might harm someone else. This information is not intended to replace advice given to you by your health care provider. Make sure you discuss any questions you have with your health care provider. Document Released: 09/10/2007 Document Revised: 04/20/2016 Document Reviewed: 03/17/2014  2017  Elsevier   Continue medications as directed.  Will contact patient when labs result.  Please verify with Pain Clinic about PCP managing Lorazepam.  RTC in 107months/PRN.  All questions/concerns answered.

## 2016-12-08 LAB — CBC WITH DIFFERENTIAL/PLATELET
BASOS ABS: 0 10*3/uL (ref 0.0–0.2)
BASOS: 1 %
EOS (ABSOLUTE): 0.1 10*3/uL (ref 0.0–0.4)
EOS: 2 %
HEMATOCRIT: 37.2 % (ref 34.0–46.6)
HEMOGLOBIN: 12.5 g/dL (ref 11.1–15.9)
IMMATURE GRANS (ABS): 0 10*3/uL (ref 0.0–0.1)
Immature Granulocytes: 0 %
LYMPHS ABS: 2.5 10*3/uL (ref 0.7–3.1)
LYMPHS: 32 %
MCH: 31.5 pg (ref 26.6–33.0)
MCHC: 33.6 g/dL (ref 31.5–35.7)
MCV: 94 fL (ref 79–97)
MONOCYTES: 9 %
Monocytes Absolute: 0.7 10*3/uL (ref 0.1–0.9)
NEUTROS ABS: 4.6 10*3/uL (ref 1.4–7.0)
Neutrophils: 56 %
Platelets: 422 10*3/uL — ABNORMAL HIGH (ref 150–379)
RBC: 3.97 x10E6/uL (ref 3.77–5.28)
RDW: 13.3 % (ref 12.3–15.4)
WBC: 8 10*3/uL (ref 3.4–10.8)

## 2016-12-08 LAB — BASIC METABOLIC PANEL
BUN / CREAT RATIO: 9 (ref 9–23)
BUN: 6 mg/dL (ref 6–24)
CALCIUM: 8.9 mg/dL (ref 8.7–10.2)
CHLORIDE: 97 mmol/L (ref 96–106)
CO2: 25 mmol/L (ref 18–29)
Creatinine, Ser: 0.64 mg/dL (ref 0.57–1.00)
GFR calc Af Amer: 120 mL/min/{1.73_m2} (ref >59)
GFR calc non Af Amer: 104 mL/min/{1.73_m2} (ref >59)
Glucose: 84 mg/dL (ref 65–99)
Potassium: 4.4 mmol/L (ref 3.5–5.2)
Sodium: 140 mmol/L (ref 134–144)

## 2016-12-08 LAB — VITAMIN D 25 HYDROXY (VIT D DEFICIENCY, FRACTURES): Vit D, 25-Hydroxy: 11.8 ng/mL — ABNORMAL LOW (ref 30.0–100.0)

## 2016-12-08 LAB — TSH: TSH: 0.948 u[IU]/mL (ref 0.450–4.500)

## 2016-12-11 ENCOUNTER — Other Ambulatory Visit: Payer: Self-pay | Admitting: Adult Health

## 2016-12-11 DIAGNOSIS — E559 Vitamin D deficiency, unspecified: Secondary | ICD-10-CM

## 2016-12-11 MED ORDER — VITAMIN D (ERGOCALCIFEROL) 1.25 MG (50000 UNIT) PO CAPS: 50000.0000 [IU] | ORAL_CAPSULE | ORAL | 0 refills | Status: DC

## 2016-12-11 NOTE — Progress Notes (Signed)
Please call patient and explain all labs are WNL, exception she has low Vit d.  Will call in Vit d Rx to her pharmacy on file.

## 2016-12-12 ENCOUNTER — Other Ambulatory Visit: Payer: Self-pay

## 2016-12-12 MED ORDER — DULOXETINE HCL 60 MG PO CPEP: ORAL_CAPSULE | ORAL | 0 refills | Status: DC

## 2016-12-12 NOTE — Progress Notes (Signed)
Please call in her refill. Thank you!

## 2016-12-18 DIAGNOSIS — E119 Type 2 diabetes mellitus without complications: Secondary | ICD-10-CM | POA: Diagnosis not present

## 2016-12-18 DIAGNOSIS — G4700 Insomnia, unspecified: Secondary | ICD-10-CM | POA: Diagnosis not present

## 2016-12-18 DIAGNOSIS — E782 Mixed hyperlipidemia: Secondary | ICD-10-CM | POA: Diagnosis not present

## 2016-12-18 DIAGNOSIS — I10 Essential (primary) hypertension: Secondary | ICD-10-CM | POA: Diagnosis not present

## 2016-12-21 DIAGNOSIS — K080 Exfoliation of teeth due to systemic causes: Secondary | ICD-10-CM | POA: Diagnosis not present

## 2016-12-25 ENCOUNTER — Encounter: Payer: Self-pay | Admitting: Gastroenterology

## 2016-12-26 DIAGNOSIS — K080 Exfoliation of teeth due to systemic causes: Secondary | ICD-10-CM | POA: Diagnosis not present

## 2017-01-01 DIAGNOSIS — M545 Low back pain: Secondary | ICD-10-CM | POA: Diagnosis not present

## 2017-01-01 DIAGNOSIS — Z79891 Long term (current) use of opiate analgesic: Secondary | ICD-10-CM | POA: Diagnosis not present

## 2017-01-01 DIAGNOSIS — M79606 Pain in leg, unspecified: Secondary | ICD-10-CM | POA: Diagnosis not present

## 2017-01-01 DIAGNOSIS — G2581 Restless legs syndrome: Secondary | ICD-10-CM | POA: Diagnosis not present

## 2017-01-01 DIAGNOSIS — G894 Chronic pain syndrome: Secondary | ICD-10-CM | POA: Diagnosis not present

## 2017-01-01 DIAGNOSIS — Z79899 Other long term (current) drug therapy: Secondary | ICD-10-CM | POA: Diagnosis not present

## 2017-01-25 ENCOUNTER — Ambulatory Visit (AMBULATORY_SURGERY_CENTER): Payer: Self-pay

## 2017-01-25 VITALS — Ht 65.0 in | Wt 196.8 lb

## 2017-01-25 DIAGNOSIS — Z1211 Encounter for screening for malignant neoplasm of colon: Secondary | ICD-10-CM

## 2017-01-25 MED ORDER — NA SULFATE-K SULFATE-MG SULF 17.5-3.13-1.6 GM/177ML PO SOLN: 1.0000 | Freq: Once | ORAL | 0 refills | Status: AC

## 2017-01-25 NOTE — Progress Notes (Signed)
Denies allergies to eggs or soy products. Denies complication of anesthesia or sedation. Denies use of weight loss medication. Denies use of O2.   Emmi instructions declined.  

## 2017-01-30 ENCOUNTER — Encounter: Payer: Self-pay | Admitting: Gastroenterology

## 2017-02-01 DIAGNOSIS — Z79899 Other long term (current) drug therapy: Secondary | ICD-10-CM | POA: Diagnosis not present

## 2017-02-01 DIAGNOSIS — M79606 Pain in leg, unspecified: Secondary | ICD-10-CM | POA: Diagnosis not present

## 2017-02-01 DIAGNOSIS — M545 Low back pain: Secondary | ICD-10-CM | POA: Diagnosis not present

## 2017-02-01 DIAGNOSIS — Z79891 Long term (current) use of opiate analgesic: Secondary | ICD-10-CM | POA: Diagnosis not present

## 2017-02-01 DIAGNOSIS — G894 Chronic pain syndrome: Secondary | ICD-10-CM | POA: Diagnosis not present

## 2017-02-01 DIAGNOSIS — G2581 Restless legs syndrome: Secondary | ICD-10-CM | POA: Diagnosis not present

## 2017-02-08 ENCOUNTER — Encounter: Payer: Self-pay | Admitting: Gastroenterology

## 2017-02-08 ENCOUNTER — Ambulatory Visit (AMBULATORY_SURGERY_CENTER): Payer: Federal, State, Local not specified - PPO | Admitting: Gastroenterology

## 2017-02-08 VITALS — BP 139/84 | HR 74 | Temp 97.7°F | Resp 13 | Ht 65.0 in | Wt 196.0 lb

## 2017-02-08 DIAGNOSIS — D120 Benign neoplasm of cecum: Secondary | ICD-10-CM | POA: Diagnosis not present

## 2017-02-08 DIAGNOSIS — D122 Benign neoplasm of ascending colon: Secondary | ICD-10-CM | POA: Diagnosis not present

## 2017-02-08 DIAGNOSIS — Z1212 Encounter for screening for malignant neoplasm of rectum: Secondary | ICD-10-CM

## 2017-02-08 DIAGNOSIS — Z1211 Encounter for screening for malignant neoplasm of colon: Secondary | ICD-10-CM | POA: Diagnosis not present

## 2017-02-08 MED ORDER — SODIUM CHLORIDE 0.9 % IV SOLN: 500.0000 mL | INTRAVENOUS | Status: AC

## 2017-02-08 NOTE — Progress Notes (Signed)
Report given to PACU, vss 

## 2017-02-08 NOTE — Op Note (Signed)
[facility] Patient Name: Christine Frank Procedure Date: 02/08/2017 9:39 AM MRN: [id number] Endoscopist: Mauri Pole , MD Age: 51 Referring MD:  Date of Birth: 1966/01/09 Gender: Female Account #: [id number] Procedure:                Colonoscopy Indications:              Screening for colorectal malignant neoplasm, This                            is the patient's first colonoscopy Medicines:                Monitored Anesthesia Care Procedure:                Pre-Anesthesia Assessment:                           - Prior to the procedure, a History and Physical                            was performed, and patient medications and                            allergies were reviewed. The patient's tolerance of                            previous anesthesia was also reviewed. The risks                            and benefits of the procedure and the sedation                            options and risks were discussed with the patient.                            All questions were answered, and informed consent                            was obtained. Prior Anticoagulants: The patient has                            taken no previous anticoagulant or antiplatelet                            agents. ASA Grade Assessment: II - A patient with                            mild systemic disease. After reviewing the risks                            and benefits, the patient was deemed in                            satisfactory condition to undergo the procedure.  After obtaining informed consent, the colonoscope                            was passed under direct vision. Throughout the                            procedure, the patient's blood pressure, pulse, and                            oxygen saturations were monitored continuously. The                            Model CF-HQ190L [id number]) scope was introduced                            through the  anus and advanced to the the cecum,                            identified by appendiceal orifice and ileocecal                            valve. The colonoscopy was performed without                            difficulty. The patient tolerated the procedure                            well. The quality of the bowel preparation was                            good. The ileocecal valve, appendiceal orifice, and                            rectum were photographed. Scope In: 9:44:38 AM Scope Out: 9:59:23 AM Scope Withdrawal Time: 0 hours 10 minutes 17 seconds  Total Procedure Duration: 0 hours 14 minutes 45 seconds  Findings:                 The perianal and digital rectal examinations were                            normal.                           Two sessile polyps were found in the ascending                            colon and cecum. The polyps were 4 to 5 mm in size.                            These polyps were removed with a cold snare.                            Resection and retrieval were complete.  A diffuse area of severe melanosis was found in the                            entire colon.                           A few small-mouthed diverticula were found in the                            sigmoid colon.                           The exam was otherwise without abnormality. Complications:            No immediate complications. Estimated Blood Loss:     Estimated blood loss was minimal. Impression:               - Two 4 to 5 mm polyps in the ascending colon and                            in the cecum, removed with a cold snare. Resected                            and retrieved.                           - Melanosis in the colon.                           - Diverticulosis in the sigmoid colon.                           - The examination was otherwise normal. Recommendation:           - Patient has a contact number available for                             emergencies. The signs and symptoms of potential                            delayed complications were discussed with the                            patient. Return to normal activities tomorrow.                            Written discharge instructions were provided to the                            patient.                           - Resume previous diet.                           - Continue present medications.                           -  Await pathology results.                           - Repeat colonoscopy in 5-10 years for surveillance                            based on pathology results. Mauri Pole, MD 02/08/2017 10:07:20 AM This report has been signed electronically.

## 2017-02-08 NOTE — Progress Notes (Signed)
Called to room to assist during endoscopic procedure.  Patient ID and intended procedure confirmed with present staff. Received instructions for my participation in the procedure from the performing physician.  

## 2017-02-08 NOTE — Patient Instructions (Signed)
Colon polyps removed today. Handouts given on polyps,diverticulosis. Resume current medications. Result letter in your mail in 2-3 weeks. Call us with any questions or concerns. Thank you!  YOU HAD AN ENDOSCOPIC PROCEDURE TODAY AT [facility]:   Refer to the procedure report that was given to you for any specific questions about what was found during the examination.  If the procedure report does not answer your questions, please call your gastroenterologist to clarify.  If you requested that your care partner not be given the details of your procedure findings, then the procedure report has been included in a sealed envelope for you to review at your convenience later.  YOU SHOULD EXPECT: Some feelings of bloating in the abdomen. Passage of more gas than usual.  Walking can help get rid of the air that was put into your GI tract during the procedure and reduce the bloating. If you had a lower endoscopy (such as a colonoscopy or flexible sigmoidoscopy) you may notice spotting of blood in your stool or on the toilet paper. If you underwent a bowel prep for your procedure, you may not have a normal bowel movement for a few days.  Please Note:  You might notice some irritation and congestion in your nose or some drainage.  This is from the oxygen used during your procedure.  There is no need for concern and it should clear up in a day or so.  SYMPTOMS TO REPORT IMMEDIATELY:   Following lower endoscopy (colonoscopy or flexible sigmoidoscopy):  Excessive amounts of blood in the stool  Significant tenderness or worsening of abdominal pains  Swelling of the abdomen that is new, acute  Fever of 100F or higher   For urgent or emergent issues, a gastroenterologist can be reached at any hour by calling [phone].   DIET:  We do recommend a small meal at first, but then you may proceed to your regular diet.  Drink plenty of fluids but you should avoid alcoholic beverages for 24  hours.  ACTIVITY:  You should plan to take it easy for the rest of today and you should NOT DRIVE or use heavy machinery until tomorrow (because of the sedation medicines used during the test).    FOLLOW UP: Our staff will call the number listed on your records the next business day following your procedure to check on you and address any questions or concerns that you may have regarding the information given to you following your procedure. If we do not reach you, we will leave a message.  However, if you are feeling well and you are not experiencing any problems, there is no need to return our call.  We will assume that you have returned to your regular daily activities without incident.  If any biopsies were taken you will be contacted by phone or by letter within the next 1-3 weeks.  Please call us at [phone] if you have not heard about the biopsies in 3 weeks.    SIGNATURES/CONFIDENTIALITY: You and/or your care partner have signed paperwork which will be entered into your electronic medical record.  These signatures attest to the fact that that the information above on your After Visit Summary has been reviewed and is understood.  Full responsibility of the confidentiality of this discharge information lies with you and/or your care-partner.

## 2017-02-09 ENCOUNTER — Telehealth: Payer: Self-pay

## 2017-02-09 NOTE — Telephone Encounter (Signed)
Left message on answering machine. 

## 2017-02-12 ENCOUNTER — Telehealth: Payer: Self-pay | Admitting: *Deleted

## 2017-02-12 NOTE — Telephone Encounter (Signed)
Message left

## 2017-02-15 ENCOUNTER — Encounter: Payer: Self-pay | Admitting: Gastroenterology

## 2017-02-15 ENCOUNTER — Other Ambulatory Visit: Payer: Self-pay

## 2017-02-15 MED ORDER — LORAZEPAM 1 MG PO TABS: ORAL_TABLET | ORAL | 0 refills | Status: DC

## 2017-02-15 NOTE — Telephone Encounter (Signed)
Pt states that the pain clinic is aware that she takes this medication and should not violate her contract.  Charyl Bigger, CMA

## 2017-02-15 NOTE — Telephone Encounter (Signed)
Please authorize refill if appropriate.  Charyl Bigger, CMA

## 2017-02-15 NOTE — Telephone Encounter (Signed)
LVM for pt to call to discuss before any refills will be authorized.  Charyl Bigger, CMA

## 2017-02-15 NOTE — Telephone Encounter (Signed)
Afternoon Tonya,  Can you please call Ms. Mangas and confirm with her that if we refill her Lorazepam that it will not violate her medication contract with her Pain Clinic? Thanks! Valetta Fuller

## 2017-02-19 DIAGNOSIS — K080 Exfoliation of teeth due to systemic causes: Secondary | ICD-10-CM | POA: Diagnosis not present

## 2017-03-01 ENCOUNTER — Other Ambulatory Visit: Payer: Self-pay | Admitting: Internal Medicine

## 2017-03-01 DIAGNOSIS — M79606 Pain in leg, unspecified: Secondary | ICD-10-CM | POA: Diagnosis not present

## 2017-03-01 DIAGNOSIS — G894 Chronic pain syndrome: Secondary | ICD-10-CM | POA: Diagnosis not present

## 2017-03-01 DIAGNOSIS — M545 Low back pain: Secondary | ICD-10-CM | POA: Diagnosis not present

## 2017-03-01 DIAGNOSIS — Z79899 Other long term (current) drug therapy: Secondary | ICD-10-CM | POA: Diagnosis not present

## 2017-03-01 DIAGNOSIS — G2581 Restless legs syndrome: Secondary | ICD-10-CM | POA: Diagnosis not present

## 2017-03-01 DIAGNOSIS — Z79891 Long term (current) use of opiate analgesic: Secondary | ICD-10-CM | POA: Diagnosis not present

## 2017-03-02 ENCOUNTER — Other Ambulatory Visit: Payer: Self-pay | Admitting: Internal Medicine

## 2017-03-05 ENCOUNTER — Other Ambulatory Visit: Payer: Self-pay

## 2017-03-05 ENCOUNTER — Telehealth: Payer: Self-pay | Admitting: Adult Health

## 2017-03-05 DIAGNOSIS — F332 Major depressive disorder, recurrent severe without psychotic features: Secondary | ICD-10-CM | POA: Diagnosis not present

## 2017-03-05 DIAGNOSIS — E782 Mixed hyperlipidemia: Secondary | ICD-10-CM | POA: Diagnosis not present

## 2017-03-05 DIAGNOSIS — E119 Type 2 diabetes mellitus without complications: Secondary | ICD-10-CM | POA: Diagnosis not present

## 2017-03-05 DIAGNOSIS — G4700 Insomnia, unspecified: Secondary | ICD-10-CM | POA: Diagnosis not present

## 2017-03-05 MED ORDER — CETIRIZINE HCL 10 MG PO TABS: ORAL_TABLET | ORAL | 0 refills | Status: DC

## 2017-03-05 NOTE — Telephone Encounter (Signed)
Patient is requesting a refill of her butalbital caffine AND cetirizine

## 2017-03-05 NOTE — Telephone Encounter (Signed)
Refill request routed to [facility].  Charyl Bigger, CMA

## 2017-03-06 ENCOUNTER — Other Ambulatory Visit: Payer: Self-pay | Admitting: Adult Health

## 2017-03-06 MED ORDER — BUTALBITAL-APAP-CAFFEINE 50-325-40 MG PO CAPS: ORAL_CAPSULE | ORAL | 0 refills | Status: DC

## 2017-03-12 ENCOUNTER — Other Ambulatory Visit: Payer: Self-pay

## 2017-03-12 MED ORDER — VITAMIN D (ERGOCALCIFEROL) 1.25 MG (50000 UNIT) PO CAPS: 50000.0000 [IU] | ORAL_CAPSULE | ORAL | 0 refills | Status: DC

## 2017-03-19 ENCOUNTER — Other Ambulatory Visit: Payer: Self-pay

## 2017-03-19 MED ORDER — DULOXETINE HCL 60 MG PO CPEP: ORAL_CAPSULE | ORAL | 0 refills | Status: DC

## 2017-04-05 DIAGNOSIS — Z79891 Long term (current) use of opiate analgesic: Secondary | ICD-10-CM | POA: Diagnosis not present

## 2017-04-05 DIAGNOSIS — M545 Low back pain: Secondary | ICD-10-CM | POA: Diagnosis not present

## 2017-04-05 DIAGNOSIS — G894 Chronic pain syndrome: Secondary | ICD-10-CM | POA: Diagnosis not present

## 2017-04-05 DIAGNOSIS — M79606 Pain in leg, unspecified: Secondary | ICD-10-CM | POA: Diagnosis not present

## 2017-04-05 DIAGNOSIS — M5416 Radiculopathy, lumbar region: Secondary | ICD-10-CM | POA: Diagnosis not present

## 2017-04-05 DIAGNOSIS — Z79899 Other long term (current) drug therapy: Secondary | ICD-10-CM | POA: Diagnosis not present

## 2017-04-05 DIAGNOSIS — G2581 Restless legs syndrome: Secondary | ICD-10-CM | POA: Diagnosis not present

## 2017-04-09 ENCOUNTER — Other Ambulatory Visit: Payer: Self-pay | Admitting: Pain Medicine

## 2017-04-09 DIAGNOSIS — M545 Low back pain, unspecified: Secondary | ICD-10-CM

## 2017-05-03 DIAGNOSIS — G894 Chronic pain syndrome: Secondary | ICD-10-CM | POA: Diagnosis not present

## 2017-05-03 DIAGNOSIS — G2581 Restless legs syndrome: Secondary | ICD-10-CM | POA: Diagnosis not present

## 2017-05-03 DIAGNOSIS — Z79899 Other long term (current) drug therapy: Secondary | ICD-10-CM | POA: Diagnosis not present

## 2017-05-03 DIAGNOSIS — M79606 Pain in leg, unspecified: Secondary | ICD-10-CM | POA: Diagnosis not present

## 2017-05-03 DIAGNOSIS — Z79891 Long term (current) use of opiate analgesic: Secondary | ICD-10-CM | POA: Diagnosis not present

## 2017-05-03 DIAGNOSIS — M545 Low back pain: Secondary | ICD-10-CM | POA: Diagnosis not present

## 2017-05-31 DIAGNOSIS — G894 Chronic pain syndrome: Secondary | ICD-10-CM | POA: Diagnosis not present

## 2017-05-31 DIAGNOSIS — Z79899 Other long term (current) drug therapy: Secondary | ICD-10-CM | POA: Diagnosis not present

## 2017-05-31 DIAGNOSIS — E559 Vitamin D deficiency, unspecified: Secondary | ICD-10-CM | POA: Diagnosis not present

## 2017-05-31 DIAGNOSIS — I10 Essential (primary) hypertension: Secondary | ICD-10-CM | POA: Diagnosis not present

## 2017-05-31 DIAGNOSIS — M545 Low back pain: Secondary | ICD-10-CM | POA: Diagnosis not present

## 2017-05-31 DIAGNOSIS — M5416 Radiculopathy, lumbar region: Secondary | ICD-10-CM | POA: Diagnosis not present

## 2017-05-31 DIAGNOSIS — E1169 Type 2 diabetes mellitus with other specified complication: Secondary | ICD-10-CM | POA: Diagnosis not present

## 2017-05-31 DIAGNOSIS — E119 Type 2 diabetes mellitus without complications: Secondary | ICD-10-CM | POA: Diagnosis not present

## 2017-05-31 DIAGNOSIS — Z79891 Long term (current) use of opiate analgesic: Secondary | ICD-10-CM | POA: Diagnosis not present

## 2017-06-04 ENCOUNTER — Other Ambulatory Visit: Payer: Self-pay

## 2017-06-04 MED ORDER — CETIRIZINE HCL 10 MG PO TABS: ORAL_TABLET | ORAL | 3 refills | Status: DC

## 2017-06-06 DIAGNOSIS — E785 Hyperlipidemia, unspecified: Secondary | ICD-10-CM | POA: Diagnosis not present

## 2017-06-06 DIAGNOSIS — E1169 Type 2 diabetes mellitus with other specified complication: Secondary | ICD-10-CM | POA: Diagnosis not present

## 2017-06-07 ENCOUNTER — Ambulatory Visit: Payer: Federal, State, Local not specified - PPO | Admitting: Adult Health

## 2017-06-07 DIAGNOSIS — M5416 Radiculopathy, lumbar region: Secondary | ICD-10-CM | POA: Diagnosis not present

## 2017-06-11 ENCOUNTER — Other Ambulatory Visit: Payer: Self-pay

## 2017-06-11 MED ORDER — VITAMIN D (ERGOCALCIFEROL) 1.25 MG (50000 UNIT) PO CAPS: 50000.0000 [IU] | ORAL_CAPSULE | ORAL | 0 refills | Status: DC

## 2017-06-18 ENCOUNTER — Other Ambulatory Visit: Payer: Self-pay

## 2017-06-18 MED ORDER — DULOXETINE HCL 60 MG PO CPEP: ORAL_CAPSULE | ORAL | 0 refills | Status: DC

## 2017-06-28 ENCOUNTER — Ambulatory Visit: Payer: Federal, State, Local not specified - PPO | Admitting: Adult Health

## 2017-06-28 DIAGNOSIS — Z79899 Other long term (current) drug therapy: Secondary | ICD-10-CM | POA: Diagnosis not present

## 2017-06-28 DIAGNOSIS — Z79891 Long term (current) use of opiate analgesic: Secondary | ICD-10-CM | POA: Diagnosis not present

## 2017-06-28 DIAGNOSIS — G2581 Restless legs syndrome: Secondary | ICD-10-CM | POA: Diagnosis not present

## 2017-06-28 DIAGNOSIS — M545 Low back pain: Secondary | ICD-10-CM | POA: Diagnosis not present

## 2017-06-28 DIAGNOSIS — G894 Chronic pain syndrome: Secondary | ICD-10-CM | POA: Diagnosis not present

## 2017-06-28 DIAGNOSIS — M79606 Pain in leg, unspecified: Secondary | ICD-10-CM | POA: Diagnosis not present

## 2017-07-02 ENCOUNTER — Other Ambulatory Visit (HOSPITAL_COMMUNITY): Payer: Self-pay | Admitting: Pain Medicine

## 2017-07-05 ENCOUNTER — Encounter: Payer: Self-pay | Admitting: Adult Health

## 2017-07-05 ENCOUNTER — Ambulatory Visit (INDEPENDENT_AMBULATORY_CARE_PROVIDER_SITE_OTHER): Payer: Federal, State, Local not specified - PPO | Admitting: Adult Health

## 2017-07-05 VITALS — BP 126/79 | HR 109 | Ht 64.0 in | Wt 193.1 lb

## 2017-07-05 DIAGNOSIS — Z1231 Encounter for screening mammogram for malignant neoplasm of breast: Secondary | ICD-10-CM | POA: Diagnosis not present

## 2017-07-05 DIAGNOSIS — G43821 Menstrual migraine, not intractable, with status migrainosus: Secondary | ICD-10-CM

## 2017-07-05 DIAGNOSIS — M5440 Lumbago with sciatica, unspecified side: Secondary | ICD-10-CM | POA: Insufficient documentation

## 2017-07-05 DIAGNOSIS — Z0000 Encounter for general adult medical examination without abnormal findings: Secondary | ICD-10-CM | POA: Insufficient documentation

## 2017-07-05 DIAGNOSIS — Z1239 Encounter for other screening for malignant neoplasm of breast: Secondary | ICD-10-CM

## 2017-07-05 MED ORDER — BUTALBITAL-ASPIRIN-CAFFEINE 50-325-40 MG PO CAPS: ORAL_CAPSULE | ORAL | 1 refills | Status: DC

## 2017-07-05 NOTE — Assessment & Plan Note (Addendum)
Neurology referral placed. Restarted on Fiorinal 50/325/40mg -she was on previously and tolerated well.  She stated that her Pain Specialist informed her that this medication would not void her pain contract.  [place] Controlled Substance Website reviewed. Instructed to send MyChart message in 4 weeks to discuss medication effectiveness.

## 2017-07-05 NOTE — Progress Notes (Signed)
Subjective:    Patient ID: Christine Frank, female    DOB: 03-Jan-1966, 51 y.o.   MRN: [id number]  HPI:  Ms. Busser is here for f/u: Anxiety, insomnia, lumbar back pain with sciatica, and neuropathy.  She feels that her emotional state is well controlled and her pain has greatly reduced with the current pain regime provided by pain clinic-[place] Controlled Substance Registry verified.  She has been drinking 1L water/day and trying to reduce CHO/saturated fat.  She has been walking 3-4 days a week for 43mins on home treadmill.  She has one new complaint- increased in frequency/severity of migraine HAs, sx's include hot flashes, pain "all over my head", N/V.   She denies dizziness/change in vision. She feels that HAs are related to menopause.  She would like to re-start Fiorinal, she tolerated well and OTC NSAIDS do not help. She has strong family hx of aneurysm and she is concerned that HAs are a sx of an aneurysm.   Patient Care Team    Relationship Specialty Notifications Start End  Mina Marble D, NP PCP - General Family Medicine  12/07/16   Meredith Pel, MD Consulting Physician Orthopedic Surgery  12/07/16   Renie Ora, MD Referring Physician Anesthesiology  12/07/16   Anastasio Auerbach, MD Consulting Physician Gynecology  12/07/16     Patient Active Problem List   Diagnosis Date Noted  . Healthcare maintenance 07/05/2017  . Menstrual migraine with status migrainosus, not intractable 07/05/2017  . Back pain of lumbar region with sciatica 07/05/2017  . Varicose veins of lower extremities with other complications 85/12/7739     Past Medical History:  Diagnosis Date  . Allergy   . Anemia   . PMDD (premenstrual dysphoric disorder)   . Restless leg   . S/P epidural steroid injection    affects legs is seen at pain clinic.     Past Surgical History:  Procedure Laterality Date  . CERVICAL CERCLAGE    . CESAREAN SECTION     x 2  . CESAREAN SECTION  1994  . CESAREAN  SECTION  1999     Family History  Problem Relation Age of Onset  . Hypertension Father   . Diabetes Father   . Hyperlipidemia Father   . Alcohol abuse Maternal Grandmother   . Alcohol abuse Maternal Grandfather   . Heart attack Paternal Grandfather   . Colon cancer Neg Hx   . Esophageal cancer Neg Hx   . Rectal cancer Neg Hx   . Stomach cancer Neg Hx      History  Drug Use No     History  Alcohol Use  . 0.0 oz/week    Comment: Rare     History  Smoking Status  . Never Smoker  Smokeless Tobacco  . Never Used     Outpatient Encounter Prescriptions as of 07/05/2017  Medication Sig Note  . baclofen (LIORESAL) 10 MG tablet Take 1 tablet (10 mg total) by mouth 2 (two) times daily. Take 1/2 to 1 tablet 2 x day if needed for muscle spasm   . cetirizine (ZYRTEC) 10 MG tablet TAKE 1 TABLET DAILY FOR ALLERGIES   . docusate sodium (COLACE) 100 MG capsule Take 100 mg by mouth 2 (two) times daily.   . DULoxetine (CYMBALTA) 60 MG capsule TAKE 1 CAPSULE (60 MG TOTAL) BY MOUTH DAILY.   . EMBEDA 50-2 MG CPCR Take 1 capsule by mouth daily. 12/07/2016: Received from: External Pharmacy  . fluconazole (DIFLUCAN) 150  MG tablet TAKE 1 TABLET BY MOUTH 1 TIME A WEEK AS NEEDED.   . fluticasone (FLONASE) 50 MCG/ACT nasal spray INHALE 1-2 SPRAYS IN EACH NOSTRIL TWICE DAILY   . HYDROcodone-acetaminophen (NORCO) 10-325 MG tablet Take 1 tablet by mouth 3 (three) times daily. 12/07/2016: Received from: External Pharmacy  . ipratropium (ATROVENT) 0.03 % nasal spray PLACE 2 SPRAYS INTO THE NOSE 3 (THREE) TIMES DAILY.   . magnesium gluconate (MAGONATE) 500 MG tablet Take 500 mg by mouth 2 (two) times daily.   . Multiple Vitamin (MULTIVITAMIN) capsule Take 1 capsule by mouth daily.   . Vitamin D, Ergocalciferol, (DRISDOL) 50000 units CAPS capsule Take 1 capsule (50,000 Units total) by mouth every 7 (seven) days.   . butalbital-aspirin-caffeine (FIORINAL) 50-325-40 MG capsule TAKE 1 CAPSULE BY MOUTH EVERY  6 HOURS AS NEEDED FOR MIGRAINE   . [DISCONTINUED] Butalbital-APAP-Caffeine 50-325-40 MG capsule TAKE ONE CAPSULE BY MOUTH EVERY 6 HOURS AS NEEDED FOR MIGRANE (Patient not taking: Reported on 07/05/2017)   . [DISCONTINUED] butalbital-aspirin-caffeine (FIORINAL) 50-325-40 MG capsule TAKE 1 CAPSULE BY MOUTH EVERY 6 HOURS AS NEEDED FOR MIGRAINE (Patient not taking: Reported on 07/05/2017)   . [DISCONTINUED] LORazepam (ATIVAN) 1 MG tablet TAKE 1 TABLET EVERY 8 HOURS AS NEEDED FOR ANXIETY (Patient not taking: Reported on 07/05/2017)    Facility-Administered Encounter Medications as of 07/05/2017  Medication  . 0.9 %  sodium chloride infusion    Allergies: Sulfa antibiotics  Body mass index is 33.15 kg/m.  Blood pressure 126/79, pulse (!) 109, height 5\' 4"  (1.626 m), weight 193 lb 1.6 oz (87.6 kg), last menstrual period 06/24/2017.  Review of Systems  Constitutional: Positive for fatigue. Negative for activity change, appetite change, chills, diaphoresis, fever and unexpected weight change.  Eyes: Negative for visual disturbance.  Respiratory: Negative for cough, chest tightness, shortness of breath and wheezing.   Cardiovascular: Negative for chest pain, palpitations and leg swelling.  Gastrointestinal: Negative for abdominal distention, abdominal pain, blood in stool, constipation, diarrhea, nausea and vomiting.  Endocrine: Negative for cold intolerance, heat intolerance, polydipsia, polyphagia and polyuria.  Genitourinary: Negative for difficulty urinating, flank pain and genital sores.  Musculoskeletal: Positive for arthralgias, back pain and myalgias. Negative for gait problem, joint swelling and neck pain.  Neurological: Positive for headaches. Negative for dizziness, tremors, speech difficulty, weakness and light-headedness.  Hematological: Does not bruise/bleed easily.       Objective:   Physical Exam  Constitutional: She is oriented to person, place, and time. She appears well-developed  and well-nourished. No distress.  HENT:  Head: Normocephalic and atraumatic.  Right Ear: External ear normal.  Left Ear: External ear normal.  Eyes: Pupils are equal, round, and reactive to light. Conjunctivae are normal.  Neck: Normal range of motion. Neck supple.  Cardiovascular: Normal rate, normal heart sounds and intact distal pulses.   No murmur heard. Pulmonary/Chest: Effort normal and breath sounds normal. No respiratory distress. She has no wheezes. She has no rales. She exhibits no tenderness.  Lymphadenopathy:    She has no cervical adenopathy.  Neurological: She is alert and oriented to person, place, and time. Coordination normal.  Skin: Skin is warm and dry. No rash noted. She is not diaphoretic. No erythema. No pallor.  Psychiatric: She has a normal mood and affect. Her behavior is normal. Judgment and thought content normal.  Nursing note and vitals reviewed.         Assessment & Plan:   1. Menstrual migraine with status migrainosus, not intractable  2. Healthcare maintenance   3. Screening for breast cancer     Menstrual migraine with status migrainosus, not intractable Neurology referral placed. Restarted on Fiorinal 50/325/40mg -she was on previously and tolerated well.  She stated that her Pain Specialist informed her that this medication would not void her pain contract.  [place] Controlled Substance Website reviewed. Instructed to send MyChart message in 4 weeks to discuss medication effectiveness.  Healthcare maintenance Strive to consumes at least 80 ounces water/day, follow heart healthy diet. Continue treadmill walking 6mins 3-4 times/week-GREAT! Continue all medication as directed. Continue monthly Pain Management OVs Recommend  CPE in 4 months.     FOLLOW-UP:  Return in about 4 months (around 11/04/2017) for CPE.

## 2017-07-05 NOTE — Assessment & Plan Note (Addendum)
Strive to consumes at least 80 ounces water/day, follow heart healthy diet. Continue treadmill walking 52mins 3-4 times/week-GREAT! Continue all medication as directed. Continue monthly Pain Management OVs Recommend  CPE in 4 months.

## 2017-07-05 NOTE — Patient Instructions (Addendum)
Heart-Healthy Eating Plan Many factors influence your heart health, including eating and exercise habits. Heart (coronary) risk increases with abnormal blood fat (lipid) levels. Heart-healthy meal planning includes limiting unhealthy fats, increasing healthy fats, and making other small dietary changes. This includes maintaining a healthy body weight to help keep lipid levels within a normal range. What is my plan? Your health care provider recommends that you:  Get no more than _________% of the total calories in your daily diet from fat.  Limit your intake of saturated fat to less than _________% of your total calories each day.  Limit the amount of cholesterol in your diet to less than _________ mg per day.  What types of fat should I choose?  Choose healthy fats more often. Choose monounsaturated and polyunsaturated fats, such as olive oil and canola oil, flaxseeds, walnuts, almonds, and seeds.  Eat more omega-3 fats. Good choices include salmon, mackerel, sardines, tuna, flaxseed oil, and ground flaxseeds. Aim to eat fish at least two times each week.  Limit saturated fats. Saturated fats are primarily found in animal products, such as meats, butter, and cream. Plant sources of saturated fats include palm oil, palm kernel oil, and coconut oil.  Avoid foods with partially hydrogenated oils in them. These contain trans fats. Examples of foods that contain trans fats are stick margarine, some tub margarines, cookies, crackers, and other baked goods. What general guidelines do I need to follow?  Check food labels carefully to identify foods with trans fats or high amounts of saturated fat.  Fill one half of your plate with vegetables and green salads. Eat 4-5 servings of vegetables per day. A serving of vegetables equals 1 cup of raw leafy vegetables,  cup of raw or cooked cut-up vegetables, or  cup of vegetable juice.  Fill one fourth of your plate with whole grains. Look for the word  "whole" as the first word in the ingredient list.  Fill one fourth of your plate with lean protein foods.  Eat 4-5 servings of fruit per day. A serving of fruit equals one medium whole fruit,  cup of dried fruit,  cup of fresh, frozen, or canned fruit, or  cup of 100% fruit juice.  Eat more foods that contain soluble fiber. Examples of foods that contain this type of fiber are apples, broccoli, carrots, beans, peas, and barley. Aim to get 20-30 g of fiber per day.  Eat more home-cooked food and less restaurant, buffet, and fast food.  Limit or avoid alcohol.  Limit foods that are high in starch and sugar.  Avoid fried foods.  Cook foods by using methods other than frying. Baking, boiling, grilling, and broiling are all great options. Other fat-reducing suggestions include: ? Removing the skin from poultry. ? Removing all visible fats from meats. ? Skimming the fat off of stews, soups, and gravies before serving them. ? Steaming vegetables in water or broth.  Lose weight if you are overweight. Losing just 5-10% of your initial body weight can help your overall health and prevent diseases such as diabetes and heart disease.  Increase your consumption of nuts, legumes, and seeds to 4-5 servings per week. One serving of dried beans or legumes equals  cup after being cooked, one serving of nuts equals 1 ounces, and one serving of seeds equals  ounce or 1 tablespoon.  You may need to monitor your salt (sodium) intake, especially if you have high blood pressure. Talk with your health care provider or dietitian to get  more information about reducing sodium. What foods can I eat? Grains  Breads, including [place], white, pita, wheat, raisin, rye, oatmeal, and [place]. Tortillas that are neither fried nor made with lard or trans fat. Low-fat rolls, including hotdog and hamburger buns and English muffins. Biscuits. Muffins. Waffles. Pancakes. Light popcorn. Whole-grain cereals. Flatbread.  Melba toast. Pretzels. Breadsticks. Rusks. Low-fat snacks and crackers, including oyster, saltine, matzo, graham, animal, and rye. Rice and pasta, including brown rice and those that are made with whole wheat. Vegetables All vegetables. Fruits All fruits, but limit coconut. Meats and Other Protein Sources Lean, well-trimmed beef, veal, pork, and lamb. Chicken and [place] without skin. All fish and shellfish. Wild duck, rabbit, pheasant, and venison. Egg whites or low-cholesterol egg substitutes. Dried beans, peas, lentils, and tofu.Seeds and most nuts. Dairy Low-fat or nonfat cheeses, including ricotta, string, and mozzarella. Skim or 1% milk that is liquid, powdered, or evaporated. Buttermilk that is made with low-fat milk. Nonfat or low-fat yogurt. Beverages Mineral water. Diet carbonated beverages. Sweets and Desserts Sherbets and fruit ices. Honey, jam, marmalade, jelly, and syrups. Meringues and gelatins. Pure sugar candy, such as hard candy, jelly beans, gumdrops, mints, marshmallows, and small amounts of dark chocolate. [employer] Eat all sweets and desserts in moderation. Fats and Oils Nonhydrogenated (trans-free) margarines. Vegetable oils, including soybean, sesame, sunflower, olive, peanut, safflower, corn, canola, and cottonseed. Salad dressings or mayonnaise that are made with a vegetable oil. Limit added fats and oils that you use for cooking, baking, salads, and as spreads. Other Cocoa powder. Coffee and tea. All seasonings and condiments. The items listed above may not be a complete list of recommended foods or beverages. Contact your dietitian for more options. What foods are not recommended? Grains Breads that are made with saturated or trans fats, oils, or whole milk. Croissants. Butter rolls. Cheese breads. Sweet rolls. Donuts. Buttered popcorn. Chow mein noodles. High-fat crackers, such as cheese or butter crackers. Meats and Other Protein Sources Fatty meats, such  as hotdogs, short ribs, sausage, spareribs, bacon, ribeye roast or steak, and mutton. High-fat deli meats, such as salami and bologna. Caviar. Domestic duck and goose. Organ meats, such as kidney, liver, sweetbreads, brains, gizzard, chitterlings, and heart. Dairy Cream, sour cream, cream cheese, and creamed cottage cheese. Whole milk cheeses, including blue (bleu), Monte[place] Jack, [place], [place], [place], [place], [place], [place], [place], and [place]. Whole or 2% milk that is liquid, evaporated, or condensed. Whole buttermilk. Cream sauce or high-fat cheese sauce. Yogurt that is made from whole milk. Beverages Regular sodas and drinks with added sugar. Sweets and Desserts Frosting. Pudding. Cookies. Cakes other than angel food cake. Candy that has milk chocolate or white chocolate, hydrogenated fat, butter, coconut, or unknown ingredients. Buttered syrups. Full-fat ice cream or ice cream drinks. Fats and Oils Gravy that has suet, meat fat, or shortening. Cocoa butter, hydrogenated oils, palm oil, coconut oil, palm kernel oil. These can often be found in baked products, candy, fried foods, nondairy creamers, and whipped toppings. Solid fats and shortenings, including bacon fat, salt pork, lard, and butter. Nondairy cream substitutes, such as coffee creamers and sour cream substitutes. Salad dressings that are made of unknown oils, cheese, or sour cream. The items listed above may not be a complete list of foods and beverages to avoid. Contact your dietitian for more information. This information is not intended to replace advice given to you by your health care provider. Make sure you discuss any questions you have with your health care  provider. Document Released: 08/22/2008 Document Revised: 06/02/2016 Document Reviewed: 05/07/2014 Elsevier Interactive Patient Education  2017 [place].   Migraine Headache A migraine headache is an intense, throbbing pain on one side or both sides of the head.  Migraines may also cause other symptoms, such as nausea, vomiting, and sensitivity to light and noise. What are the causes? Doing or taking certain things may also trigger migraines, such as:  Alcohol.  Smoking.  Medicines, such as: ? Medicine used to treat chest pain (nitroglycerine). ? Birth control pills. ? Estrogen pills. ? Certain blood pressure medicines.  Aged cheeses, chocolate, or caffeine.  Foods or drinks that contain nitrates, glutamate, aspartame, or tyramine.  Physical activity.  Other things that may trigger a migraine include:  Menstruation.  Pregnancy.  Hunger.  Stress, lack of sleep, too much sleep, or fatigue.  Weather changes.  What increases the risk? The following factors may make you more likely to experience migraine headaches:  Age. Risk increases with age.  Family history of migraine headaches.  Being Caucasian.  Depression and anxiety.  Obesity.  Being a woman.  Having a hole in the heart (patent foramen ovale) or other heart problems.  What are the signs or symptoms? The main symptom of this condition is pulsating or throbbing pain. Pain may:  Happen in any area of the head, such as on one side or both sides.  Interfere with daily activities.  Get worse with physical activity.  Get worse with exposure to bright lights or loud noises.  Other symptoms may include:  Nausea.  Vomiting.  Dizziness.  General sensitivity to bright lights, loud noises, or smells.  Before you get a migraine, you may get warning signs that a migraine is developing (aura). An aura may include:  Seeing flashing lights or having blind spots.  Seeing bright spots, halos, or zigzag lines.  Having tunnel vision or blurred vision.  Having numbness or a tingling feeling.  Having trouble talking.  Having muscle weakness.  How is this diagnosed? A migraine headache can be diagnosed based on:  Your symptoms.  A physical exam.  Tests,  such as CT scan or MRI of the head. These imaging tests can help rule out other causes of headaches.  Taking fluid from the spine (lumbar puncture) and analyzing it (cerebrospinal fluid analysis, or CSF analysis).  How is this treated? A migraine headache is usually treated with medicines that:  Relieve pain.  Relieve nausea.  Prevent migraines from coming back.  Treatment may also include:  Acupuncture.  Lifestyle changes like avoiding foods that trigger migraines.  Follow these instructions at home: Medicines  Take over-the-counter and prescription medicines only as told by your health care provider.  Do not drive or use heavy machinery while taking prescription pain medicine.  To prevent or treat constipation while you are taking prescription pain medicine, your health care provider may recommend that you: ? Drink enough fluid to keep your urine clear or pale yellow. ? Take over-the-counter or prescription medicines. ? Eat foods that are high in fiber, such as fresh fruits and vegetables, whole grains, and beans. ? Limit foods that are high in fat and processed sugars, such as fried and sweet foods. Lifestyle  Avoid alcohol use.  Do not use any products that contain nicotine or tobacco, such as cigarettes and e-cigarettes. If you need help quitting, ask your health care provider.  Get at least 8 hours of sleep every night.  Limit your stress. General instructions  Keep a journal to find out what may trigger your migraine headaches. For example, write down: ? What you eat and drink. ? How much sleep you get. ? Any change to your diet or medicines.  If you have a migraine: ? Avoid things that make your symptoms worse, such as bright lights. ? It may help to lie down in a dark, quiet room. ? Do not drive or use heavy machinery. ? Ask your health care provider what activities are safe for you while you are experiencing symptoms.  Keep all follow-up visits as told  by your health care provider. This is important. Contact a health care provider if:  You develop symptoms that are different or more severe than your usual migraine symptoms. Get help right away if:  Your migraine becomes severe.  You have a fever.  You have a stiff neck.  You have vision loss.  Your muscles feel weak or like you cannot control them.  You start to lose your balance often.  You develop trouble walking.  You faint. This information is not intended to replace advice given to you by your health care provider. Make sure you discuss any questions you have with your health care provider. Document Released: 11/13/2005 Document Revised: 06/02/2016 Document Reviewed: 05/01/2016 Elsevier Interactive Patient Education  2017 [employer].  Neurology referral placed. Fiorinal 50/325/40mg  refill sent it, use as directed. Increase water intake and follow heart healthy diet. Great job on the treadmill walking 3-4 times/week! Please continue with Pain Management as directed. Please schedule complete physical in 4 months. NICE TO SEE YOU!

## 2017-07-15 ENCOUNTER — Other Ambulatory Visit: Payer: Self-pay | Admitting: Adult Health

## 2017-07-31 DIAGNOSIS — K080 Exfoliation of teeth due to systemic causes: Secondary | ICD-10-CM | POA: Diagnosis not present

## 2017-08-02 DIAGNOSIS — G2581 Restless legs syndrome: Secondary | ICD-10-CM | POA: Diagnosis not present

## 2017-08-02 DIAGNOSIS — G894 Chronic pain syndrome: Secondary | ICD-10-CM | POA: Diagnosis not present

## 2017-08-02 DIAGNOSIS — Z79891 Long term (current) use of opiate analgesic: Secondary | ICD-10-CM | POA: Diagnosis not present

## 2017-08-02 DIAGNOSIS — M47816 Spondylosis without myelopathy or radiculopathy, lumbar region: Secondary | ICD-10-CM | POA: Diagnosis not present

## 2017-08-02 DIAGNOSIS — K080 Exfoliation of teeth due to systemic causes: Secondary | ICD-10-CM | POA: Diagnosis not present

## 2017-08-02 DIAGNOSIS — Z79899 Other long term (current) drug therapy: Secondary | ICD-10-CM | POA: Diagnosis not present

## 2017-08-02 DIAGNOSIS — M79606 Pain in leg, unspecified: Secondary | ICD-10-CM | POA: Diagnosis not present

## 2017-08-07 ENCOUNTER — Encounter: Payer: Self-pay | Admitting: Neurology

## 2017-08-22 ENCOUNTER — Telehealth: Payer: Self-pay

## 2017-08-22 NOTE — Telephone Encounter (Signed)
Christine Frank, pharmacist at [facility], called stating that this patient is on multiple controlled substances and wanted to be sure we were aware of this.  Pharmacist states that pt is on Fiorinal, hydrocodone, and Embeda and has been getting them filled at multiple pharmacies.  Advised pharmacist that we are aware that the patient is on all of these medications and that Hydrocodone and Embeda are prescribed by the pain clinic and not our provider.  Pharmacist expressed understanding.  Mina Marble, NP made aware.  Charyl Bigger, CMA

## 2017-08-30 DIAGNOSIS — M47816 Spondylosis without myelopathy or radiculopathy, lumbar region: Secondary | ICD-10-CM | POA: Diagnosis not present

## 2017-08-30 DIAGNOSIS — G894 Chronic pain syndrome: Secondary | ICD-10-CM | POA: Diagnosis not present

## 2017-08-30 DIAGNOSIS — M25569 Pain in unspecified knee: Secondary | ICD-10-CM | POA: Diagnosis not present

## 2017-08-30 DIAGNOSIS — M79606 Pain in leg, unspecified: Secondary | ICD-10-CM | POA: Diagnosis not present

## 2017-09-06 ENCOUNTER — Ambulatory Visit: Payer: Federal, State, Local not specified - PPO | Admitting: Neurology

## 2017-09-27 ENCOUNTER — Other Ambulatory Visit: Payer: Self-pay | Admitting: Pain Medicine

## 2017-09-27 DIAGNOSIS — G894 Chronic pain syndrome: Secondary | ICD-10-CM | POA: Diagnosis not present

## 2017-09-27 DIAGNOSIS — M545 Low back pain: Secondary | ICD-10-CM

## 2017-09-27 DIAGNOSIS — M25569 Pain in unspecified knee: Secondary | ICD-10-CM | POA: Diagnosis not present

## 2017-09-27 DIAGNOSIS — M47816 Spondylosis without myelopathy or radiculopathy, lumbar region: Secondary | ICD-10-CM | POA: Diagnosis not present

## 2017-09-27 DIAGNOSIS — M79606 Pain in leg, unspecified: Secondary | ICD-10-CM | POA: Diagnosis not present

## 2017-10-22 ENCOUNTER — Other Ambulatory Visit: Payer: Self-pay | Admitting: Adult Health

## 2017-10-23 NOTE — Telephone Encounter (Signed)
Please review for appropriateness of refill.  T. Nelson, CMA 

## 2017-10-23 NOTE — Telephone Encounter (Signed)
Pt informed.  Pt states that she has an appt with neurology in January.  RX faxed to pharmacy.  Charyl Bigger, CMA

## 2017-10-23 NOTE — Telephone Encounter (Signed)
Referral placed to Neurology 8/18 I will provide only one more rx for Fiorinal, she will need to request refills from Neurology going forward.

## 2017-10-25 DIAGNOSIS — M25569 Pain in unspecified knee: Secondary | ICD-10-CM | POA: Diagnosis not present

## 2017-10-25 DIAGNOSIS — M79606 Pain in leg, unspecified: Secondary | ICD-10-CM | POA: Diagnosis not present

## 2017-10-25 DIAGNOSIS — G894 Chronic pain syndrome: Secondary | ICD-10-CM | POA: Diagnosis not present

## 2017-10-25 DIAGNOSIS — M47816 Spondylosis without myelopathy or radiculopathy, lumbar region: Secondary | ICD-10-CM | POA: Diagnosis not present

## 2017-10-25 DIAGNOSIS — Z79899 Other long term (current) drug therapy: Secondary | ICD-10-CM | POA: Diagnosis not present

## 2017-10-25 DIAGNOSIS — Z79891 Long term (current) use of opiate analgesic: Secondary | ICD-10-CM | POA: Diagnosis not present

## 2017-11-01 ENCOUNTER — Other Ambulatory Visit: Payer: Federal, State, Local not specified - PPO

## 2017-11-01 DIAGNOSIS — M25551 Pain in right hip: Secondary | ICD-10-CM | POA: Diagnosis not present

## 2017-11-01 DIAGNOSIS — M545 Low back pain: Secondary | ICD-10-CM | POA: Diagnosis not present

## 2017-11-01 DIAGNOSIS — M25552 Pain in left hip: Secondary | ICD-10-CM | POA: Diagnosis not present

## 2017-11-01 DIAGNOSIS — M25561 Pain in right knee: Secondary | ICD-10-CM | POA: Diagnosis not present

## 2017-11-02 DIAGNOSIS — M25551 Pain in right hip: Secondary | ICD-10-CM | POA: Diagnosis not present

## 2017-11-02 DIAGNOSIS — M25561 Pain in right knee: Secondary | ICD-10-CM | POA: Diagnosis not present

## 2017-11-02 DIAGNOSIS — M545 Low back pain: Secondary | ICD-10-CM | POA: Diagnosis not present

## 2017-11-02 DIAGNOSIS — M25552 Pain in left hip: Secondary | ICD-10-CM | POA: Diagnosis not present

## 2017-11-06 NOTE — Progress Notes (Signed)
Subjective:    Patient ID: Christine Frank, female    DOB: [birth year], 51 y.o.   MRN: [id number]  HPI:  07/05/17 OV: Christine Frank is here for f/u: Anxiety, insomnia, lumbar back pain with sciatica, and neuropathy.  She feels that her emotional state is well controlled and her pain has greatly reduced with the current pain regime provided by pain clinic-[place] Controlled Substance Registry verified.  She has been drinking 1L water/day and trying to reduce CHO/saturated fat.  She has been walking 3-4 days a week for 65mins on home treadmill.  She has one new complaint- increased in frequency/severity of migraine HAs, sx's include hot flashes, pain "all over my head", N/V.   She denies dizziness/change in vision. She feels that HAs are related to menopause.  She would like to re-start Fiorinal, she tolerated well and OTC NSAIDS do not help. She has strong family hx of aneurysm and she is concerned that HAs are a sx of an aneurysm.   11/08/17 OV: Christine Frank is here for CPE.  She reports medication compliance and denies SE.  She has appt with Neurologist in Jan 2019 and reports dramatic reduction in HA since re-starting the Fiorinal. She denies any acute changes since f/u in 8/18, with one exception- sinus pressure, clear nasal drainage, and non-productive cough the last 4 days Healthcare Maintenance: PAP-completed today Mammogram-due now, orders placed Colonoscopy-due 2023, stool cards provided today   Patient Care Team    Relationship Specialty Notifications Start End  Esaw Grandchild, NP PCP - General Family Medicine  12/07/16   Meredith Pel, MD Consulting Physician Orthopedic Surgery  12/07/16   Renie Ora, MD Referring Physician Anesthesiology  12/07/16   Anastasio Auerbach, MD [place] Physician Gynecology  12/07/16     Patient Active Problem List   Diagnosis Date Noted  . Healthcare maintenance 07/05/2017  . Menstrual migraine with status migrainosus, not intractable  07/05/2017  . Back pain of lumbar region with sciatica 07/05/2017  . Varicose veins of lower extremities with other complications 99/35/7017     Past Medical History:  Diagnosis Date  . Allergy   . Anemia   . PMDD (premenstrual dysphoric disorder)   . Restless leg   . S/P epidural steroid injection    affects legs is seen at pain clinic.     Past Surgical History:  Procedure Laterality Date  . CERVICAL CERCLAGE    . CESAREAN SECTION     x 2  . CESAREAN SECTION  1994  . CESAREAN SECTION  1999     Family History  Problem Relation Age of Onset  . Hypertension Father   . Diabetes Father   . Hyperlipidemia Father   . Alcohol abuse Maternal Grandmother   . Alcohol abuse Maternal Grandfather   . Heart attack Paternal Grandfather   . Colon cancer Neg Hx   . Esophageal cancer Neg Hx   . Rectal cancer Neg Hx   . Stomach cancer Neg Hx      Social History   Substance and Sexual Activity  Drug Use No     Social History   Substance and Sexual Activity  Alcohol Use Yes  . Alcohol/week: 0.0 oz   Comment: Rare     Social History   Tobacco Use  Smoking Status Never Smoker  Smokeless Tobacco Never Used     Outpatient Encounter Medications as of 11/08/2017  Medication Sig Note  . baclofen (LIORESAL) 10 MG tablet Take 1 tablet (10  mg total) by mouth 2 (two) times daily. Take 1/2 to 1 tablet 2 x day if needed for muscle spasm   . butalbital-aspirin-caffeine (FIORINAL) 50-325-40 MG capsule TAKE 1 CAPSULE BY MOUTH EVERY 6 HOURS AS NEEDED FOR  MIGRAINE   . cetirizine (ZYRTEC) 10 MG tablet TAKE 1 TABLET DAILY FOR ALLERGIES   . docusate sodium (COLACE) 100 MG capsule Take 100 mg by mouth 2 (two) times daily.   . DULoxetine (CYMBALTA) 60 MG capsule Take 1 capsule (60 mg total) by mouth daily. MUST HAVE OFFICE VISIT PRIOR TO ANY FURTHER REFILLS.   Marland Kitchen EMBEDA 50-2 MG CPCR Take 1 capsule by mouth daily. 12/07/2016: Received from: External Pharmacy  . fluconazole (DIFLUCAN) 150  MG tablet TAKE 1 TABLET BY MOUTH 1 TIME A WEEK AS NEEDED.   . fluticasone (FLONASE) 50 MCG/ACT nasal spray INHALE 1-2 SPRAYS IN EACH NOSTRIL TWICE DAILY   . HYDROcodone-acetaminophen (NORCO) 10-325 MG tablet Take 1 tablet by mouth 3 (three) times daily. 12/07/2016: Received from: External Pharmacy  . ipratropium (ATROVENT) 0.03 % nasal spray PLACE 2 SPRAYS INTO THE NOSE 3 (THREE) TIMES DAILY.   . magnesium gluconate (MAGONATE) 500 MG tablet Take 500 mg by mouth 2 (two) times daily.   . Multiple Vitamin (MULTIVITAMIN) capsule Take 1 capsule by mouth daily.   . Vitamin D, Ergocalciferol, (DRISDOL) 50000 units CAPS capsule Take 1 capsule (50,000 Units total) by mouth every 7 (seven) days.    Facility-Administered Encounter Medications as of 11/08/2017  Medication  . 0.9 %  sodium chloride infusion    Allergies: Sulfa antibiotics  There is no height or weight on file to calculate BMI.  There were no vitals taken for this visit.  Review of Systems  Constitutional: Positive for fatigue. Negative for activity change, appetite change, chills, diaphoresis, fever and unexpected weight change.  Eyes: Negative for visual disturbance.  Respiratory: Negative for cough, chest tightness, shortness of breath and wheezing.   Cardiovascular: Negative for chest pain, palpitations and leg swelling.  Gastrointestinal: Negative for abdominal distention, abdominal pain, blood in stool, constipation, diarrhea, nausea and vomiting.  Endocrine: Negative for cold intolerance, heat intolerance, polydipsia, polyphagia and polyuria.  Genitourinary: Negative for difficulty urinating, flank pain and genital sores.  Musculoskeletal: Positive for arthralgias, back pain and myalgias. Negative for gait problem, joint swelling and neck pain.  Neurological: Positive for headaches. Negative for dizziness, tremors, speech difficulty, weakness and light-headedness.  Hematological: Does not bruise/bleed easily.        Objective:   Physical Exam  Constitutional: She is oriented to person, place, and time. She appears well-developed and well-nourished. No distress.  HENT:  Head: Normocephalic and atraumatic.  Right Ear: Hearing and external ear normal. Tympanic membrane is bulging. Tympanic membrane is not erythematous. No decreased hearing is noted.  Left Ear: Hearing and external ear normal. Tympanic membrane is bulging. Tympanic membrane is not erythematous. No decreased hearing is noted.  Nose: Mucosal edema and rhinorrhea present. Right sinus exhibits maxillary sinus tenderness and frontal sinus tenderness. Left sinus exhibits maxillary sinus tenderness and frontal sinus tenderness.  Mouth/Throat: Uvula is midline and mucous membranes are normal. No oropharyngeal exudate, posterior oropharyngeal edema, posterior oropharyngeal erythema or tonsillar abscesses.  Eyes: Conjunctivae are normal. Pupils are equal, round, and reactive to light.  Neck: Normal range of motion. Neck supple.  Cardiovascular: Normal rate, normal heart sounds and intact distal pulses.  No murmur heard. Pulmonary/Chest: Effort normal and breath sounds normal. No respiratory distress. She has no  wheezes. She has no rales. She exhibits no tenderness.  Abdominal: Soft. Bowel sounds are normal. She exhibits no distension and no mass. There is no tenderness. There is no rebound and no guarding.  Genitourinary: Vagina normal and uterus normal. No breast swelling, tenderness, discharge or bleeding. No vaginal discharge found.  Genitourinary Comments: Chaperone present during examination.  Lymphadenopathy:    She has no cervical adenopathy.  Neurological: She is alert and oriented to person, place, and time. Coordination normal.  Skin: Skin is warm and dry. No rash noted. She is not diaphoretic. No erythema. No pallor.  Psychiatric: She has a normal mood and affect. Her behavior is normal. Judgment and thought content normal.  Nursing note  and vitals reviewed.         Assessment & Plan:   1. Need for influenza vaccination   2. Screening for cervical cancer   3. Healthcare maintenance   4. Vitamin D deficiency   5. Screening for breast cancer     Screening for cervical cancer PAP completed today  Healthcare maintenance Please continue all medications as directed. Increase water intake, strive for at least 90 ounces/day.   Follow Heart Healthy diet Increase regular exercise.  Recommend at least 30 minutes daily, 5 days per week of walking, jogging, biking, swimming, YouTube/Pinterest workout videos. Continue with PT as directed. Please continue with pain clinic as directed. Please keep Neurology appt. Please return for fasting labs in a few weeks and 6 months for regular follow-up, sooner if needed. If nasal drainage, sinus pressure present into next week, please call clinic and we will send in antibiotic. Mammogram order placed.  Vitamin D deficiency Will re-check vit d level in few weeks    FOLLOW-UP:  Return in about 6 months (around 05/09/2018) for Regular Follow Up.

## 2017-11-08 ENCOUNTER — Other Ambulatory Visit (HOSPITAL_COMMUNITY)
Admission: RE | Admit: 2017-11-08 | Discharge: 2017-11-08 | Disposition: A | Discharge status: Home / Self Care | Payer: Federal, State, Local not specified - PPO | Source: Ambulatory Visit | Attending: Adult Health | Admitting: Adult Health

## 2017-11-08 ENCOUNTER — Other Ambulatory Visit: Payer: Self-pay | Admitting: Adult Health

## 2017-11-08 ENCOUNTER — Encounter: Payer: Self-pay | Admitting: Adult Health

## 2017-11-08 ENCOUNTER — Ambulatory Visit (INDEPENDENT_AMBULATORY_CARE_PROVIDER_SITE_OTHER): Payer: Federal, State, Local not specified - PPO | Admitting: Adult Health

## 2017-11-08 VITALS — BP 116/76 | HR 87 | Ht 64.0 in | Wt 191.3 lb

## 2017-11-08 DIAGNOSIS — Z0000 Encounter for general adult medical examination without abnormal findings: Secondary | ICD-10-CM | POA: Diagnosis not present

## 2017-11-08 DIAGNOSIS — E559 Vitamin D deficiency, unspecified: Secondary | ICD-10-CM

## 2017-11-08 DIAGNOSIS — Z1239 Encounter for other screening for malignant neoplasm of breast: Secondary | ICD-10-CM

## 2017-11-08 DIAGNOSIS — Z1231 Encounter for screening mammogram for malignant neoplasm of breast: Secondary | ICD-10-CM

## 2017-11-08 DIAGNOSIS — M545 Low back pain, unspecified: Secondary | ICD-10-CM

## 2017-11-08 DIAGNOSIS — Z23 Encounter for immunization: Secondary | ICD-10-CM

## 2017-11-08 DIAGNOSIS — Z124 Encounter for screening for malignant neoplasm of cervix: Secondary | ICD-10-CM | POA: Insufficient documentation

## 2017-11-08 NOTE — Assessment & Plan Note (Signed)
Please continue all medications as directed. Increase water intake, strive for at least 90 ounces/day.   Follow Heart Healthy diet Increase regular exercise.  Recommend at least 30 minutes daily, 5 days per week of walking, jogging, biking, swimming, YouTube/Pinterest workout videos. Continue with PT as directed. Please continue with pain clinic as directed. Please keep Neurology appt. Please return for fasting labs in a few weeks and 6 months for regular follow-up, sooner if needed. If nasal drainage, sinus pressure present into next week, please call clinic and we will send in antibiotic. Mammogram order placed.

## 2017-11-08 NOTE — Assessment & Plan Note (Signed)
Will re-check vit d level in few weeks

## 2017-11-08 NOTE — Assessment & Plan Note (Signed)
PAP completed today 

## 2017-11-08 NOTE — Patient Instructions (Addendum)
Heart-Healthy Eating Plan Many factors influence your heart health, including eating and exercise habits. Heart (coronary) risk increases with abnormal blood fat (lipid) levels. Heart-healthy meal planning includes limiting unhealthy fats, increasing healthy fats, and making other small dietary changes. This includes maintaining a healthy body weight to help keep lipid levels within a normal range. What is my plan? Your health care provider recommends that you:  Get no more than __25__% of the total calories in your daily diet from fat.  Limit your intake of saturated fat to less than ____5____% of your total calories each day.  Limit the amount of cholesterol in your diet to less than ___300___ mg per day.  What types of fat should I choose?  Choose healthy fats more often. Choose monounsaturated and polyunsaturated fats, such as olive oil and canola oil, flaxseeds, walnuts, almonds, and seeds.  Eat more omega-3 fats. Good choices include salmon, mackerel, sardines, tuna, flaxseed oil, and ground flaxseeds. Aim to eat fish at least two times each week.  Limit saturated fats. Saturated fats are primarily found in animal products, such as meats, butter, and cream. Plant sources of saturated fats include palm oil, palm kernel oil, and coconut oil.  Avoid foods with partially hydrogenated oils in them. These contain trans fats. Examples of foods that contain trans fats are stick margarine, some tub margarines, cookies, crackers, and other baked goods. What general guidelines do I need to follow?  Check food labels carefully to identify foods with trans fats or high amounts of saturated fat.  Fill one half of your plate with vegetables and green salads. Eat 4-5 servings of vegetables per day. A serving of vegetables equals 1 cup of raw leafy vegetables,  cup of raw or cooked cut-up vegetables, or  cup of vegetable juice.  Fill one fourth of your plate with whole grains. Look for the word  "whole" as the first word in the ingredient list.  Fill one fourth of your plate with lean protein foods.  Eat 4-5 servings of fruit per day. A serving of fruit equals one medium whole fruit,  cup of dried fruit,  cup of fresh, frozen, or canned fruit, or  cup of 100% fruit juice.  Eat more foods that contain soluble fiber. Examples of foods that contain this type of fiber are apples, broccoli, carrots, beans, peas, and barley. Aim to get 20-30 g of fiber per day.  Eat more home-cooked food and less restaurant, buffet, and fast food.  Limit or avoid alcohol.  Limit foods that are high in starch and sugar.  Avoid fried foods.  Cook foods by using methods other than frying. Baking, boiling, grilling, and broiling are all great options. Other fat-reducing suggestions include: ? Removing the skin from poultry. ? Removing all visible fats from meats. ? Skimming the fat off of stews, soups, and gravies before serving them. ? Steaming vegetables in water or broth.  Lose weight if you are overweight. Losing just 5-10% of your initial body weight can help your overall health and prevent diseases such as diabetes and heart disease.  Increase your consumption of nuts, legumes, and seeds to 4-5 servings per week. One serving of dried beans or legumes equals  cup after being cooked, one serving of nuts equals 1 ounces, and one serving of seeds equals  ounce or 1 tablespoon.  You may need to monitor your salt (sodium) intake, especially if you have high blood pressure. Talk with your health care provider or dietitian to get  more information about reducing sodium. What foods can I eat? Grains  Breads, including [place], white, pita, wheat, raisin, rye, oatmeal, and [place]. Tortillas that are neither fried nor made with lard or trans fat. Low-fat rolls, including hotdog and hamburger buns and English muffins. Biscuits. Muffins. Waffles. Pancakes. Light popcorn. Whole-grain cereals. Flatbread.  Melba toast. Pretzels. Breadsticks. Rusks. Low-fat snacks and crackers, including oyster, saltine, matzo, graham, animal, and rye. Rice and pasta, including brown rice and those that are made with whole wheat. Vegetables All vegetables. Fruits All fruits, but limit coconut. Meats and Other Protein Sources Lean, well-trimmed beef, veal, pork, and lamb. Chicken and [place] without skin. All fish and shellfish. Wild duck, rabbit, pheasant, and venison. Egg whites or low-cholesterol egg substitutes. Dried beans, peas, lentils, and tofu.Seeds and most nuts. Dairy Low-fat or nonfat cheeses, including ricotta, string, and mozzarella. Skim or 1% milk that is liquid, powdered, or evaporated. Buttermilk that is made with low-fat milk. Nonfat or low-fat yogurt. Beverages Mineral water. Diet carbonated beverages. Sweets and Desserts Sherbets and fruit ices. Honey, jam, marmalade, jelly, and syrups. Meringues and gelatins. Pure sugar candy, such as hard candy, jelly beans, gumdrops, mints, marshmallows, and small amounts of dark chocolate. [employer] Eat all sweets and desserts in moderation. Fats and Oils Nonhydrogenated (trans-free) margarines. Vegetable oils, including soybean, sesame, sunflower, olive, peanut, safflower, corn, canola, and cottonseed. Salad dressings or mayonnaise that are made with a vegetable oil. Limit added fats and oils that you use for cooking, baking, salads, and as spreads. Other Cocoa powder. Coffee and tea. All seasonings and condiments. The items listed above may not be a complete list of recommended foods or beverages. Contact your dietitian for more options. What foods are not recommended? Grains Breads that are made with saturated or trans fats, oils, or whole milk. Croissants. Butter rolls. Cheese breads. Sweet rolls. Donuts. Buttered popcorn. Chow mein noodles. High-fat crackers, such as cheese or butter crackers. Meats and Other Protein Sources Fatty meats, such  as hotdogs, short ribs, sausage, spareribs, bacon, ribeye roast or steak, and mutton. High-fat deli meats, such as salami and bologna. Caviar. Domestic duck and goose. Organ meats, such as kidney, liver, sweetbreads, brains, gizzard, chitterlings, and heart. Dairy Cream, sour cream, cream cheese, and creamed cottage cheese. Whole milk cheeses, including blue (bleu), Monte[place] Jack, [place], [place], [place], [place], [place], [place], [place], and [place]. Whole or 2% milk that is liquid, evaporated, or condensed. Whole buttermilk. Cream sauce or high-fat cheese sauce. Yogurt that is made from whole milk. Beverages Regular sodas and drinks with added sugar. Sweets and Desserts Frosting. Pudding. Cookies. Cakes other than angel food cake. Candy that has milk chocolate or white chocolate, hydrogenated fat, butter, coconut, or unknown ingredients. Buttered syrups. Full-fat ice cream or ice cream drinks. Fats and Oils Gravy that has suet, meat fat, or shortening. Cocoa butter, hydrogenated oils, palm oil, coconut oil, palm kernel oil. These can often be found in baked products, candy, fried foods, nondairy creamers, and whipped toppings. Solid fats and shortenings, including bacon fat, salt pork, lard, and butter. Nondairy cream substitutes, such as coffee creamers and sour cream substitutes. Salad dressings that are made of unknown oils, cheese, or sour cream. The items listed above may not be a complete list of foods and beverages to avoid. Contact your dietitian for more information. This information is not intended to replace advice given to you by your health care provider. Make sure you discuss any questions you have with your health care  provider. Document Released: 08/22/2008 Document Revised: 06/02/2016 Document Reviewed: 05/07/2014   Please continue all medications as directed. Increase water intake, strive for at least 90 ounces/day.   Follow Heart Healthy diet Increase regular exercise.   Recommend at least 30 minutes daily, 5 days per week of walking, jogging, biking, swimming, YouTube/Pinterest workout videos. Continue with PT as directed. Please continue with pain clinic as directed. Please keep Neurology appt. Please return for fasting labs in a few weeks and 6 months for regular follow-up, sooner if needed. If nasal drainage, sinus pressure present into next week, please call clinic and we will send in antibiotic. Mammogram order placed. NICE TO SEE YOU!

## 2017-11-12 LAB — CYTOLOGY - PAP
Adequacy: ABSENT
Diagnosis: NEGATIVE

## 2017-11-13 DIAGNOSIS — M545 Low back pain: Secondary | ICD-10-CM | POA: Diagnosis not present

## 2017-11-13 DIAGNOSIS — M25561 Pain in right knee: Secondary | ICD-10-CM | POA: Diagnosis not present

## 2017-11-13 DIAGNOSIS — M25551 Pain in right hip: Secondary | ICD-10-CM | POA: Diagnosis not present

## 2017-11-13 DIAGNOSIS — M25552 Pain in left hip: Secondary | ICD-10-CM | POA: Diagnosis not present

## 2017-11-15 DIAGNOSIS — M25551 Pain in right hip: Secondary | ICD-10-CM | POA: Diagnosis not present

## 2017-11-15 DIAGNOSIS — M25552 Pain in left hip: Secondary | ICD-10-CM | POA: Diagnosis not present

## 2017-11-15 DIAGNOSIS — M545 Low back pain: Secondary | ICD-10-CM | POA: Diagnosis not present

## 2017-11-15 DIAGNOSIS — M25561 Pain in right knee: Secondary | ICD-10-CM | POA: Diagnosis not present

## 2017-11-23 DIAGNOSIS — G894 Chronic pain syndrome: Secondary | ICD-10-CM | POA: Diagnosis not present

## 2017-11-23 DIAGNOSIS — Z79899 Other long term (current) drug therapy: Secondary | ICD-10-CM | POA: Diagnosis not present

## 2017-11-23 DIAGNOSIS — G2581 Restless legs syndrome: Secondary | ICD-10-CM | POA: Diagnosis not present

## 2017-11-23 DIAGNOSIS — Z79891 Long term (current) use of opiate analgesic: Secondary | ICD-10-CM | POA: Diagnosis not present

## 2017-11-23 DIAGNOSIS — M47816 Spondylosis without myelopathy or radiculopathy, lumbar region: Secondary | ICD-10-CM | POA: Diagnosis not present

## 2017-11-28 ENCOUNTER — Telehealth: Payer: Self-pay

## 2017-11-28 ENCOUNTER — Other Ambulatory Visit: Payer: Self-pay | Admitting: Adult Health

## 2017-11-28 NOTE — Telephone Encounter (Signed)
Received Epic notification that pt has not read MyChart message regarding results.     Pap Smear results   From Fonnie Mu, CMA To [name] 11/12/2017 5:29 PM  Ms. [name] asked that I inform you that your recent pap smear results were normal.   Wishing you well,  Kenney Houseman, CMA for  Mina Marble, NP   [address]   MyChart User Last Read On  Christine Frank Not Read

## 2017-11-28 NOTE — Telephone Encounter (Signed)
LVM for pt to call to discuss results.  T. Hinda Lindor, CMA  

## 2017-11-29 ENCOUNTER — Ambulatory Visit
Admission: RE | Admit: 2017-11-29 | Discharge: 2017-11-29 | Disposition: A | Discharge status: Home / Self Care | Payer: Federal, State, Local not specified - PPO | Source: Ambulatory Visit | Attending: Pain Medicine | Admitting: Pain Medicine

## 2017-11-29 DIAGNOSIS — M25551 Pain in right hip: Secondary | ICD-10-CM | POA: Diagnosis not present

## 2017-11-29 DIAGNOSIS — M25552 Pain in left hip: Secondary | ICD-10-CM | POA: Diagnosis not present

## 2017-11-29 DIAGNOSIS — M545 Low back pain: Secondary | ICD-10-CM | POA: Diagnosis not present

## 2017-11-29 DIAGNOSIS — M25561 Pain in right knee: Secondary | ICD-10-CM | POA: Diagnosis not present

## 2017-11-29 DIAGNOSIS — M48061 Spinal stenosis, lumbar region without neurogenic claudication: Secondary | ICD-10-CM | POA: Diagnosis not present

## 2017-11-29 NOTE — Telephone Encounter (Signed)
LVM for pt to call to discuss results.  T. Ajeet Casasola, CMA  

## 2017-11-30 NOTE — Telephone Encounter (Signed)
Pt informed of results.  Pt expressed understanding and is agreeable.  T. Nelson, CMA 

## 2017-12-03 DIAGNOSIS — Z23 Encounter for immunization: Secondary | ICD-10-CM | POA: Diagnosis not present

## 2017-12-03 DIAGNOSIS — M25552 Pain in left hip: Secondary | ICD-10-CM | POA: Diagnosis not present

## 2017-12-03 DIAGNOSIS — E119 Type 2 diabetes mellitus without complications: Secondary | ICD-10-CM | POA: Diagnosis not present

## 2017-12-03 DIAGNOSIS — Z79899 Other long term (current) drug therapy: Secondary | ICD-10-CM | POA: Diagnosis not present

## 2017-12-03 DIAGNOSIS — M545 Low back pain: Secondary | ICD-10-CM | POA: Diagnosis not present

## 2017-12-03 DIAGNOSIS — E782 Mixed hyperlipidemia: Secondary | ICD-10-CM | POA: Diagnosis not present

## 2017-12-04 ENCOUNTER — Other Ambulatory Visit (INDEPENDENT_AMBULATORY_CARE_PROVIDER_SITE_OTHER): Payer: Federal, State, Local not specified - PPO

## 2017-12-04 DIAGNOSIS — Z1211 Encounter for screening for malignant neoplasm of colon: Secondary | ICD-10-CM | POA: Diagnosis not present

## 2017-12-04 LAB — IFOBT (OCCULT BLOOD)
IMMUNOLOGICAL FECAL OCCULT BLOOD TEST: NEGATIVE
IMMUNOLOGICAL FECAL OCCULT BLOOD TEST: NEGATIVE
IMMUNOLOGICAL FECAL OCCULT BLOOD TEST: NEGATIVE

## 2017-12-04 NOTE — Progress Notes (Signed)
Please call pt and let her know all specimens were Neg Thanks! Valetta Fuller

## 2017-12-05 ENCOUNTER — Other Ambulatory Visit: Payer: Federal, State, Local not specified - PPO

## 2017-12-06 DIAGNOSIS — M25561 Pain in right knee: Secondary | ICD-10-CM | POA: Diagnosis not present

## 2017-12-06 DIAGNOSIS — M25551 Pain in right hip: Secondary | ICD-10-CM | POA: Diagnosis not present

## 2017-12-06 DIAGNOSIS — M545 Low back pain: Secondary | ICD-10-CM | POA: Diagnosis not present

## 2017-12-06 DIAGNOSIS — M25552 Pain in left hip: Secondary | ICD-10-CM | POA: Diagnosis not present

## 2017-12-11 DIAGNOSIS — M25551 Pain in right hip: Secondary | ICD-10-CM | POA: Diagnosis not present

## 2017-12-11 DIAGNOSIS — M25561 Pain in right knee: Secondary | ICD-10-CM | POA: Diagnosis not present

## 2017-12-11 DIAGNOSIS — M545 Low back pain: Secondary | ICD-10-CM | POA: Diagnosis not present

## 2017-12-11 DIAGNOSIS — M25552 Pain in left hip: Secondary | ICD-10-CM | POA: Diagnosis not present

## 2017-12-13 ENCOUNTER — Ambulatory Visit: Payer: Federal, State, Local not specified - PPO | Admitting: Neurology

## 2017-12-13 ENCOUNTER — Encounter: Payer: Self-pay | Admitting: Neurology

## 2017-12-13 VITALS — BP 117/76 | HR 100 | Ht 65.0 in | Wt 195.0 lb

## 2017-12-13 DIAGNOSIS — H53453 Other localized visual field defect, bilateral: Secondary | ICD-10-CM | POA: Diagnosis not present

## 2017-12-13 DIAGNOSIS — R51 Headache with orthostatic component, not elsewhere classified: Secondary | ICD-10-CM

## 2017-12-13 DIAGNOSIS — M25551 Pain in right hip: Secondary | ICD-10-CM | POA: Diagnosis not present

## 2017-12-13 DIAGNOSIS — I671 Cerebral aneurysm, nonruptured: Secondary | ICD-10-CM

## 2017-12-13 DIAGNOSIS — Z8249 Family history of ischemic heart disease and other diseases of the circulatory system: Secondary | ICD-10-CM

## 2017-12-13 DIAGNOSIS — R519 Headache, unspecified: Secondary | ICD-10-CM

## 2017-12-13 DIAGNOSIS — M25561 Pain in right knee: Secondary | ICD-10-CM | POA: Diagnosis not present

## 2017-12-13 DIAGNOSIS — H539 Unspecified visual disturbance: Secondary | ICD-10-CM | POA: Diagnosis not present

## 2017-12-13 DIAGNOSIS — M25552 Pain in left hip: Secondary | ICD-10-CM | POA: Diagnosis not present

## 2017-12-13 DIAGNOSIS — M545 Low back pain: Secondary | ICD-10-CM | POA: Diagnosis not present

## 2017-12-13 MED ORDER — BUTALBITAL-ASPIRIN-CAFFEINE 50-325-40 MG PO CAPS: 1.0000 | ORAL_CAPSULE | Freq: Four times a day (QID) | ORAL | 3 refills | Status: DC | PRN

## 2017-12-13 MED ORDER — RIZATRIPTAN BENZOATE 10 MG PO TABS: 10.0000 mg | ORAL_TABLET | ORAL | 11 refills | Status: DC | PRN

## 2017-12-13 MED ORDER — PROMETHAZINE HCL 25 MG PO TABS: 25.0000 mg | ORAL_TABLET | Freq: Four times a day (QID) | ORAL | 11 refills | Status: DC | PRN

## 2017-12-13 NOTE — Progress Notes (Signed)
[facility]    Provider:  Dr Jaynee Eagles Referring Provider: Esaw Grandchild, NP Primary Care Physician:  Esaw Grandchild, NP  CC:  Migraines  HPI:  Christine Frank is a 52 y.o. female here as a referral from Dr. Loleta Books for migraines. Started at the age of 12-13 around the time she started her period. Migraines improved with birth control and having kids. Migraines became severe again in her 17s. Had significant nausea and vomiting. Migraines worse around her menses. She would get one at the start of menses, at the end of menstrual cycle and maybe one somewhere in between. She started having migraines with hot flashes with menopause. Her mother had 2 aneurysms and so did her aunt. She has 25 headache free days. Maybe 1-2 migraine days a month. Her migraines are unilateral, like her head is in a vice, light and sound sensitivity, nausea and vomiting. She wakes with them, worse bending over, positional. She has vision loss in both eyes peripheral vision loss. Hot flash triggers. Aunt has migraines. She has neck pain as well with the headaches. She has pulsatile tinnitus as well. No other focal neurologic deficits, associated symptoms, inciting events or modifiable factors.  Reviewed notes, labs and imaging from outside physicians, which showed:  Personally reviewed images and agree with the following: Findings: No plain film evidence of cervical spine fracture or malalignment.  On the swimmer's view, there is minimal irregularity of the anterior superior aspect of the T8 vertebral body.  This may be projectional origin.  If there is any suspicion of thoracic spine injury this will require further investigation.   IMPRESSION: No plain film evidence of cervical spine fracture or malalignment  Tsh/cbc/bmp unremarkable  Review of Systems: Patient complains of symptoms per HPI as well as the following symptoms: headache, vision loss, neck pain. Pertinent negatives and  positives per HPI. All others negative.   Social History   Socioeconomic History  . Marital status: Married    Spouse name: Not on file  . Number of children: 2  . Years of education: Not on file  . Highest education level: Bachelor's degree (e.g., BA, AB, BS)  Social Needs  . Financial resource strain: Not on file  . Food insecurity - worry: Not on file  . Food insecurity - inability: Not on file  . Transportation needs - medical: Not on file  . Transportation needs - non-medical: Not on file  Occupational History  . Not on file  Tobacco Use  . Smoking status: Former Research scientist (life sciences)  . Smokeless tobacco: Never Used  . Tobacco comment: tried tobacco a few times as a teen  Substance and Sexual Activity  . Alcohol use: Yes    Alcohol/week: 0.0 oz    Comment: Rare; 2-3 times per year socially  . Drug use: No  . Sexual activity: Yes    Birth control/protection: None    Comment: 1st intercourse 68 yo-5 partners  Other Topics Concern  . Not on file  Social History Narrative   Lives at home with husband & 1 son   Right handed   Daily less than one cup of coffee & three 8-12 oz cups of soda daily    Family History  Problem Relation Age of Onset  . Hypertension Father   . Diabetes Father   . Hyperlipidemia Father   . Alcohol abuse Maternal Grandmother   . Alcohol abuse Maternal Grandfather   . Heart attack Paternal Grandfather   . Cerebral aneurysm Mother   .  Cerebral aneurysm Maternal Aunt   . Migraines Maternal Aunt   . Colon cancer Neg Hx   . Esophageal cancer Neg Hx   . Rectal cancer Neg Hx   . Stomach cancer Neg Hx     Past Medical History:  Diagnosis Date  . Allergy   . Anemia   . Chronic hip pain   . Chronic leg pain    bilateral  . Depression   . Migraine   . PMDD (premenstrual dysphoric disorder)   . Restless leg   . S/P epidural steroid injection    affects legs is seen at pain clinic.    Past Surgical History:  Procedure Laterality Date  . CERVICAL  CERCLAGE    . CESAREAN SECTION     x 2  . CESAREAN SECTION  1994  . CESAREAN SECTION  1999    Current Outpatient Medications  Medication Sig Dispense Refill  . baclofen (LIORESAL) 10 MG tablet Take 1 tablet (10 mg total) by mouth 2 (two) times daily. Take 1/2 to 1 tablet 2 x day if needed for muscle spasm 60 tablet 0  . butalbital-aspirin-caffeine (FIORINAL) 50-325-40 MG capsule Take 1 capsule by mouth every 6 (six) hours as needed for headache. 30 capsule 3  . cetirizine (ZYRTEC) 10 MG tablet TAKE 1 TABLET DAILY FOR ALLERGIES 90 tablet 3  . diclofenac sodium (VOLTAREN) 1 % GEL Apply 1 application topically 3 (three) times daily as needed.  1  . docusate sodium (COLACE) 100 MG capsule Take 100 mg by mouth 2 (two) times daily.    . DULoxetine (CYMBALTA) 60 MG capsule Take 1 capsule (60 mg total) by mouth daily. 90 capsule 1  . EMBEDA 50-2 MG CPCR Take 1 capsule by mouth daily.    . fluconazole (DIFLUCAN) 150 MG tablet TAKE 1 TABLET BY MOUTH 1 TIME A WEEK AS NEEDED. 4 tablet 0  . fluticasone (FLONASE) 50 MCG/ACT nasal spray INHALE 1-2 SPRAYS IN EACH NOSTRIL TWICE DAILY 16 g 11  . HYDROcodone-acetaminophen (NORCO) 10-325 MG tablet Take 1 tablet by mouth 3 (three) times daily.    Marland Kitchen ipratropium (ATROVENT) 0.03 % nasal spray PLACE 2 SPRAYS INTO THE NOSE 3 (THREE) TIMES DAILY. 90 mL 1  . magnesium gluconate (MAGONATE) 500 MG tablet Take 500 mg by mouth 2 (two) times daily.    . Multiple Vitamin (MULTIVITAMIN) capsule Take 1 capsule by mouth daily.    . promethazine (PHENERGAN) 25 MG tablet Take 1 tablet (25 mg total) by mouth every 6 (six) hours as needed for nausea or vomiting. 30 tablet 11  . rizatriptan (MAXALT) 10 MG tablet Take 1 tablet (10 mg total) by mouth as needed for migraine. May repeat in 2 hours if needed. Max twice in a day. 10 tablet 11   Current Facility-Administered Medications  Medication Dose Route Frequency Provider Last Rate Last Dose  . 0.9 %  sodium chloride infusion  500  mL Intravenous Continuous Mauri Pole, MD        Allergies as of 12/13/2017 - Review Complete 12/13/2017  Allergen Reaction Noted  . Sulfa antibiotics Swelling and Rash 03/26/2012    Vitals: BP 117/76 (BP Location: Right Arm, Patient Position: Sitting)   Pulse 100   Ht 5\' 5"  (1.651 m)   Wt 195 lb (88.5 kg)   BMI 32.45 kg/m  Last Weight:  Wt Readings from Last 1 Encounters:  12/13/17 195 lb (88.5 kg)   Last Height:   Ht Readings from Last 1  Encounters:  12/13/17 5\' 5"  (1.651 m)   Physical exam: Exam: Gen: NAD, conversant, well nourised, obese, well groomed                     CV: RRR, no MRG. No Carotid Bruits. No peripheral edema, warm, nontender Eyes: Conjunctivae clear without exudates or hemorrhage  Neuro: Detailed Neurologic Exam  Speech:    Speech is normal; fluent and spontaneous with normal comprehension.  Cognition:    The patient is oriented to person, place, and time;     recent and remote memory intact;     language fluent;     normal attention, concentration,     fund of knowledge Cranial Nerves:    The pupils are equal, round, and reactive to light. The fundi are normal and spontaneous venous pulsations are present. Visual fields are reduced peripherally to finger confrontation. Extraocular movements are intact. Trigeminal sensation is intact and the muscles of mastication are normal. The face is symmetric. The palate elevates in the midline. Hearing intact. Voice is normal. Shoulder shrug is normal. The tongue has normal motion without fasciculations.   Coordination:    Normal finger to nose and heel to shin. Normal rapid alternating movements.   Gait:    Heel-toe and tandem gait are normal.   Motor Observation:    No asymmetry, no atrophy, and no involuntary movements noted. Tone:    Normal muscle tone.    Posture:    Posture is normal. normal erect    Strength:    Strength is V/V in the upper and lower limbs.      Sensation: intact  to LT     Reflex Exam:  DTR's:    Deep tendon reflexes in the upper and lower extremities are normal bilaterally.   Toes:    The toes are downgoing bilaterally.   Clonus:    Clonus is absent.       Assessment/Plan: 52 year old with headaches, past medical history of migraines, recent peripheral vision loss could be migrainous but need a full evaluation.  MRI brain w/wo contrast: Patient with bilateral peripheral vision loss and headaches need to evaluate for cavernous sinus lesion or mass especially given positional quality MRA head due to pulsatile tinnitus and family history of cerebral aneurysm in mother and maternal aunt  Orders Placed This Encounter  Procedures  . MR BRAIN W WO CONTRAST  . MR MRA HEAD WO CONTRAST   Acute migraine management: Rizatriptan: Please take one tablet at the onset of your headache. If it does not improve the symptoms please take one additional tablet. Do not take more then 2 tablets in 24hrs. Do not take use more then 2 to 3 times in a week.  May take with Phenergan   Also fiorinal if needed  Discussed: To prevent or relieve headaches, try the following: Cool Compress. Lie down and place a cool compress on your head.  Avoid headache triggers. If certain foods or odors seem to have triggered your migraines in the past, avoid them. A headache diary might help you identify triggers.  Include physical activity in your daily routine. Try a daily walk or other moderate aerobic exercise.  Manage stress. Find healthy ways to cope with the stressors, such as delegating tasks on your to-do list.  Practice relaxation techniques. Try deep breathing, yoga, massage and visualization.  Eat regularly. Eating regularly scheduled meals and maintaining a healthy diet might help prevent headaches. Also, drink plenty of fluids.  Follow  a regular sleep schedule. Sleep deprivation might contribute to headaches Consider biofeedback. With this mind-body technique, you  learn to control certain bodily functions - such as muscle tension, heart rate and blood pressure - to prevent headaches or reduce headache pain.    Proceed to emergency room if you experience new or worsening symptoms or symptoms do not resolve, if you have new neurologic symptoms or if headache is severe, or for any concerning symptom.   Provided education and documentation from American headache Society toolbox including articles on: chronic migraine medication overuse headache, chronic migraines, prevention of migraines, behavioral and other nonpharmacologic treatments for headache.    Sarina Ill, MD  [facility] [address] [place], [place] [address]  Phone [phone] Fax [phone]

## 2017-12-13 NOTE — Patient Instructions (Signed)
Rizatriptan: Please take one tablet at the onset of your headache. If it does not improve the symptoms please take one additional tablet. Do not take more then 2 tablets in 24hrs. Do not take use more then 2 to 3 times in a week.  May take with Phenergan   Also fiorinal if needed  Rizatriptan tablets What is this medicine? RIZATRIPTAN (rye za TRIP tan) is used to treat migraines with or without aura. An aura is a strange feeling or visual disturbance that warns you of an attack. It is not used to prevent migraines. This medicine may be used for other purposes; ask your health care provider or pharmacist if you have questions. COMMON BRAND NAME(S): Maxalt What should I tell my health care provider before I take this medicine? They need to know if you have any of these conditions: -bowel disease or colitis -diabetes -family history of heart disease -fast or irregular heart beat -heart or blood vessel disease, angina (chest pain), or previous heart attack -high blood pressure -high cholesterol -history of stroke, transient ischemic attacks (TIAs or mini-strokes), or intracranial bleeding -kidney or liver disease -overweight -poor circulation -postmenopausal or surgical removal of uterus and ovaries -Raynaud's disease -seizure disorder -an unusual or allergic reaction to rizatriptan, other medicines, foods, dyes, or preservatives -pregnant or trying to get pregnant -breast-feeding How should I use this medicine? This medicine is taken by mouth with a glass of water. Follow the directions on the prescription label. This medicine is taken at the first symptoms of a migraine. It is not for everyday use. If your migraine headache returns after one dose, you can take another dose as directed. You must leave at least 2 hours between doses, and do not take more than 30 mg total in 24 hours. If there is no improvement at all after the first dose, do not take a second dose without talking to your  doctor or health care professional. Do not take your medicine more often than directed. Talk to your pediatrician regarding the use of this medicine in children. While this drug may be prescribed for children as young as 6 years for selected conditions, precautions do apply. Overdosage: If you think you have taken too much of this medicine contact a poison control center or emergency room at once. NOTE: This medicine is only for you. Do not share this medicine with others. What if I miss a dose? This does not apply; this medicine is not for regular use. What may interact with this medicine? Do not take this medicine with any of the following medicines: -amphetamine, dextroamphetamine or cocaine -dihydroergotamine, ergotamine, ergoloid mesylates, methysergide, or ergot-type medication - do not take within 24 hours of taking rizatriptan -feverfew -MAOIs like Carbex, Eldepryl, Marplan, Nardil, and Parnate - do not take rizatriptan within 2 weeks of stopping MAOI therapy. -other migraine medicines like almotriptan, eletriptan, naratriptan, sumatriptan, zolmitriptan - do not take within 24 hours of taking rizatriptan -tryptophan This medicine may also interact with the following medications: -medicines for mental depression, anxiety or mood problems -propranolol This list may not describe all possible interactions. Give your health care provider a list of all the medicines, herbs, non-prescription drugs, or dietary supplements you use. Also tell them if you smoke, drink alcohol, or use illegal drugs. Some items may interact with your medicine. What should I watch for while using this medicine? Only take this medicine for a migraine headache. Take it if you get warning symptoms or at the start of a  migraine attack. It is not for regular use to prevent migraine attacks. You may get drowsy or dizzy. Do not drive, use machinery, or do anything that needs mental alertness until you know how this medicine  affects you. To reduce dizzy or fainting spells, do not sit or stand up quickly, especially if you are an older patient. Alcohol can increase drowsiness, dizziness and flushing. Avoid alcoholic drinks. Smoking cigarettes may increase the risk of heart-related side effects from using this medicine. If you take migraine medicines for 10 or more days a month, your migraines may get worse. Keep a diary of headache days and medicine use. Contact your healthcare professional if your migraine attacks occur more frequently. What side effects may I notice from receiving this medicine? Side effects that you should report to your doctor or health care professional as soon as possible: -allergic reactions like skin rash, itching or hives, swelling of the face, lips, or tongue -fast, slow, or irregular heart beat -increased or decreased blood pressure -seizures -severe stomach pain and cramping, bloody diarrhea -signs and symptoms of a blood clot such as breathing problems; changes in vision; chest pain; severe, sudden headache; pain, swelling, warmth in the leg; trouble speaking; sudden numbness or weakness of the face, arm or leg -tingling, pain, or numbness in the face, hands, or feet Side effects that usually do not require medical attention (report to your doctor or health care professional if they continue or are bothersome): -drowsiness -dry mouth -feeling warm, flushing, or redness of the face -headache -muscle cramps, pain -nausea, vomiting -unusually weak or tired This list may not describe all possible side effects. Call your doctor for medical advice about side effects. You may report side effects to FDA at 1-800-FDA-1088. Where should I keep my medicine? Keep out of the reach of children. Store at room temperature between 15 and 30 degrees C (59 and 86 degrees F). Keep container tightly closed. Throw away any unused medicine after the expiration date. NOTE: This sheet is a summary. It may not  cover all possible information. If you have questions about this medicine, talk to your doctor, pharmacist, or health care provider.  2018 Elsevier/Gold Standard (2013-07-15 10:16:39)

## 2017-12-16 ENCOUNTER — Encounter: Payer: Self-pay | Admitting: Neurology

## 2017-12-21 DIAGNOSIS — Z79891 Long term (current) use of opiate analgesic: Secondary | ICD-10-CM | POA: Diagnosis not present

## 2017-12-21 DIAGNOSIS — M25569 Pain in unspecified knee: Secondary | ICD-10-CM | POA: Diagnosis not present

## 2017-12-21 DIAGNOSIS — Z79899 Other long term (current) drug therapy: Secondary | ICD-10-CM | POA: Diagnosis not present

## 2017-12-21 DIAGNOSIS — M7060 Trochanteric bursitis, unspecified hip: Secondary | ICD-10-CM | POA: Diagnosis not present

## 2017-12-21 DIAGNOSIS — M47816 Spondylosis without myelopathy or radiculopathy, lumbar region: Secondary | ICD-10-CM | POA: Diagnosis not present

## 2017-12-21 DIAGNOSIS — G894 Chronic pain syndrome: Secondary | ICD-10-CM | POA: Diagnosis not present

## 2017-12-25 ENCOUNTER — Other Ambulatory Visit: Payer: Self-pay | Admitting: Internal Medicine

## 2017-12-26 ENCOUNTER — Other Ambulatory Visit: Payer: Self-pay

## 2017-12-26 MED ORDER — FLUTICASONE PROPIONATE 50 MCG/ACT NA SUSP: NASAL | 11 refills | Status: DC

## 2017-12-26 NOTE — Telephone Encounter (Signed)
We have not prescribed these medications for the patient previously.  Please review and refill if appropriate.  T. Nelson, CMA  

## 2017-12-27 DIAGNOSIS — M7060 Trochanteric bursitis, unspecified hip: Secondary | ICD-10-CM | POA: Diagnosis not present

## 2017-12-28 DIAGNOSIS — 419620001 Death: Secondary | SNOMED CT | POA: Diagnosis not present

## 2017-12-28 DEATH — deceased

## 2017-12-31 DIAGNOSIS — M25561 Pain in right knee: Secondary | ICD-10-CM | POA: Diagnosis not present

## 2017-12-31 DIAGNOSIS — M25551 Pain in right hip: Secondary | ICD-10-CM | POA: Diagnosis not present

## 2017-12-31 DIAGNOSIS — M25552 Pain in left hip: Secondary | ICD-10-CM | POA: Diagnosis not present

## 2017-12-31 DIAGNOSIS — M545 Low back pain: Secondary | ICD-10-CM | POA: Diagnosis not present

## 2018-01-07 ENCOUNTER — Telehealth: Payer: Self-pay | Admitting: Adult Health

## 2018-01-07 DIAGNOSIS — M25551 Pain in right hip: Secondary | ICD-10-CM | POA: Diagnosis not present

## 2018-01-07 DIAGNOSIS — M25561 Pain in right knee: Secondary | ICD-10-CM | POA: Diagnosis not present

## 2018-01-07 DIAGNOSIS — M545 Low back pain: Secondary | ICD-10-CM | POA: Diagnosis not present

## 2018-01-07 DIAGNOSIS — M25552 Pain in left hip: Secondary | ICD-10-CM | POA: Diagnosis not present

## 2018-01-07 NOTE — Telephone Encounter (Signed)
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  -Patient uses [facility] on [address].  --glh

## 2018-01-07 NOTE — Telephone Encounter (Signed)
Patient will still need to come in for office visit. We need to check her urine.

## 2018-01-10 DIAGNOSIS — M47816 Spondylosis without myelopathy or radiculopathy, lumbar region: Secondary | ICD-10-CM | POA: Diagnosis not present

## 2018-01-10 DIAGNOSIS — Z79899 Other long term (current) drug therapy: Secondary | ICD-10-CM | POA: Diagnosis not present

## 2018-01-10 DIAGNOSIS — G894 Chronic pain syndrome: Secondary | ICD-10-CM | POA: Diagnosis not present

## 2018-01-10 DIAGNOSIS — Z79891 Long term (current) use of opiate analgesic: Secondary | ICD-10-CM | POA: Diagnosis not present

## 2018-01-11 DIAGNOSIS — M545 Low back pain: Secondary | ICD-10-CM | POA: Diagnosis not present

## 2018-01-11 DIAGNOSIS — M25552 Pain in left hip: Secondary | ICD-10-CM | POA: Diagnosis not present

## 2018-01-11 DIAGNOSIS — M25551 Pain in right hip: Secondary | ICD-10-CM | POA: Diagnosis not present

## 2018-01-11 DIAGNOSIS — M25561 Pain in right knee: Secondary | ICD-10-CM | POA: Diagnosis not present

## 2018-01-14 DIAGNOSIS — M545 Low back pain: Secondary | ICD-10-CM | POA: Diagnosis not present

## 2018-01-14 DIAGNOSIS — M25552 Pain in left hip: Secondary | ICD-10-CM | POA: Diagnosis not present

## 2018-01-14 DIAGNOSIS — M25561 Pain in right knee: Secondary | ICD-10-CM | POA: Diagnosis not present

## 2018-01-14 DIAGNOSIS — M25551 Pain in right hip: Secondary | ICD-10-CM | POA: Diagnosis not present

## 2018-01-17 DIAGNOSIS — M25552 Pain in left hip: Secondary | ICD-10-CM | POA: Diagnosis not present

## 2018-01-17 DIAGNOSIS — M545 Low back pain: Secondary | ICD-10-CM | POA: Diagnosis not present

## 2018-01-17 DIAGNOSIS — M25561 Pain in right knee: Secondary | ICD-10-CM | POA: Diagnosis not present

## 2018-01-17 DIAGNOSIS — M25551 Pain in right hip: Secondary | ICD-10-CM | POA: Diagnosis not present

## 2018-01-18 DIAGNOSIS — M47816 Spondylosis without myelopathy or radiculopathy, lumbar region: Secondary | ICD-10-CM | POA: Diagnosis not present

## 2018-01-18 DIAGNOSIS — G894 Chronic pain syndrome: Secondary | ICD-10-CM | POA: Diagnosis not present

## 2018-01-18 DIAGNOSIS — Z79899 Other long term (current) drug therapy: Secondary | ICD-10-CM | POA: Diagnosis not present

## 2018-01-18 DIAGNOSIS — M7060 Trochanteric bursitis, unspecified hip: Secondary | ICD-10-CM | POA: Diagnosis not present

## 2018-01-18 DIAGNOSIS — Z79891 Long term (current) use of opiate analgesic: Secondary | ICD-10-CM | POA: Diagnosis not present

## 2018-01-18 DIAGNOSIS — M25569 Pain in unspecified knee: Secondary | ICD-10-CM | POA: Diagnosis not present

## 2018-01-22 DIAGNOSIS — M25561 Pain in right knee: Secondary | ICD-10-CM | POA: Diagnosis not present

## 2018-01-22 DIAGNOSIS — M25552 Pain in left hip: Secondary | ICD-10-CM | POA: Diagnosis not present

## 2018-01-22 DIAGNOSIS — M25551 Pain in right hip: Secondary | ICD-10-CM | POA: Diagnosis not present

## 2018-01-22 DIAGNOSIS — M545 Low back pain: Secondary | ICD-10-CM | POA: Diagnosis not present

## 2018-01-30 ENCOUNTER — Other Ambulatory Visit (INDEPENDENT_AMBULATORY_CARE_PROVIDER_SITE_OTHER): Payer: Federal, State, Local not specified - PPO

## 2018-01-30 DIAGNOSIS — Z0000 Encounter for general adult medical examination without abnormal findings: Secondary | ICD-10-CM | POA: Diagnosis not present

## 2018-01-30 DIAGNOSIS — E559 Vitamin D deficiency, unspecified: Secondary | ICD-10-CM | POA: Diagnosis not present

## 2018-01-30 DIAGNOSIS — G43821 Menstrual migraine, not intractable, with status migrainosus: Secondary | ICD-10-CM

## 2018-01-31 LAB — COMPREHENSIVE METABOLIC PANEL
A/G RATIO: 1.4 (ref 1.2–2.2)
ALT: 22 IU/L (ref 0–32)
AST: 20 IU/L (ref 0–40)
Albumin: 4.4 g/dL (ref 3.5–5.5)
Alkaline Phosphatase: 103 IU/L (ref 39–117)
BILIRUBIN TOTAL: 0.3 mg/dL (ref 0.0–1.2)
BUN/Creatinine Ratio: 12 (ref 9–23)
BUN: 8 mg/dL (ref 6–24)
CHLORIDE: 99 mmol/L (ref 96–106)
CO2: 27 mmol/L (ref 20–29)
Calcium: 10.2 mg/dL (ref 8.7–10.2)
Creatinine, Ser: 0.69 mg/dL (ref 0.57–1.00)
GFR calc Af Amer: 116 mL/min/{1.73_m2} (ref >59)
GFR calc non Af Amer: 100 mL/min/{1.73_m2} (ref >59)
GLOBULIN, TOTAL: 3.2 g/dL (ref 1.5–4.5)
Glucose: 100 mg/dL — ABNORMAL HIGH (ref 65–99)
POTASSIUM: 4.5 mmol/L (ref 3.5–5.2)
SODIUM: 140 mmol/L (ref 134–144)
Total Protein: 7.6 g/dL (ref 6.0–8.5)

## 2018-01-31 LAB — CBC WITH DIFFERENTIAL/PLATELET
BASOS ABS: 0 10*3/uL (ref 0.0–0.2)
Basos: 1 %
EOS (ABSOLUTE): 0.2 10*3/uL (ref 0.0–0.4)
Eos: 2 %
HEMOGLOBIN: 13.2 g/dL (ref 11.1–15.9)
Hematocrit: 40.3 % (ref 34.0–46.6)
Immature Grans (Abs): 0 10*3/uL (ref 0.0–0.1)
Immature Granulocytes: 0 %
Lymphocytes Absolute: 3 10*3/uL (ref 0.7–3.1)
Lymphs: 34 %
MCH: 30.1 pg (ref 26.6–33.0)
MCHC: 32.8 g/dL (ref 31.5–35.7)
MCV: 92 fL (ref 79–97)
MONOCYTES: 9 %
MONOS ABS: 0.8 10*3/uL (ref 0.1–0.9)
NEUTROS ABS: 4.7 10*3/uL (ref 1.4–7.0)
Neutrophils: 54 %
PLATELETS: 479 10*3/uL — AB (ref 150–379)
RBC: 4.38 x10E6/uL (ref 3.77–5.28)
RDW: 14.1 % (ref 12.3–15.4)
WBC: 8.7 10*3/uL (ref 3.4–10.8)

## 2018-01-31 LAB — LIPID PANEL
CHOLESTEROL TOTAL: 220 mg/dL — AB (ref 100–199)
Chol/HDL Ratio: 3.5 ratio (ref 0.0–4.4)
HDL: 63 mg/dL (ref >39)
LDL CALC: 118 mg/dL — AB (ref 0–99)
Triglycerides: 194 mg/dL — ABNORMAL HIGH (ref 0–149)
VLDL Cholesterol Cal: 39 mg/dL (ref 5–40)

## 2018-01-31 LAB — TSH: TSH: 2.47 u[IU]/mL (ref 0.450–4.500)

## 2018-01-31 LAB — VITAMIN D 25 HYDROXY (VIT D DEFICIENCY, FRACTURES): VIT D 25 HYDROXY: 26.6 ng/mL — AB (ref 30.0–100.0)

## 2018-01-31 LAB — HEMOGLOBIN A1C
Est. average glucose Bld gHb Est-mCnc: 108 mg/dL
Hgb A1c MFr Bld: 5.4 % (ref 4.8–5.6)

## 2018-02-11 DIAGNOSIS — M5136 Other intervertebral disc degeneration, lumbar region: Secondary | ICD-10-CM | POA: Diagnosis not present

## 2018-02-15 DIAGNOSIS — M7060 Trochanteric bursitis, unspecified hip: Secondary | ICD-10-CM | POA: Diagnosis not present

## 2018-02-15 DIAGNOSIS — M25569 Pain in unspecified knee: Secondary | ICD-10-CM | POA: Diagnosis not present

## 2018-02-15 DIAGNOSIS — Z79891 Long term (current) use of opiate analgesic: Secondary | ICD-10-CM | POA: Diagnosis not present

## 2018-02-15 DIAGNOSIS — Z79899 Other long term (current) drug therapy: Secondary | ICD-10-CM | POA: Diagnosis not present

## 2018-02-15 DIAGNOSIS — G2581 Restless legs syndrome: Secondary | ICD-10-CM | POA: Diagnosis not present

## 2018-02-15 DIAGNOSIS — G894 Chronic pain syndrome: Secondary | ICD-10-CM | POA: Diagnosis not present

## 2018-03-14 DIAGNOSIS — M7060 Trochanteric bursitis, unspecified hip: Secondary | ICD-10-CM | POA: Diagnosis not present

## 2018-03-14 DIAGNOSIS — Z79899 Other long term (current) drug therapy: Secondary | ICD-10-CM | POA: Diagnosis not present

## 2018-03-14 DIAGNOSIS — Z79891 Long term (current) use of opiate analgesic: Secondary | ICD-10-CM | POA: Diagnosis not present

## 2018-03-14 DIAGNOSIS — G894 Chronic pain syndrome: Secondary | ICD-10-CM | POA: Diagnosis not present

## 2018-03-14 DIAGNOSIS — M47816 Spondylosis without myelopathy or radiculopathy, lumbar region: Secondary | ICD-10-CM | POA: Diagnosis not present

## 2018-03-27 DIAGNOSIS — M5136 Other intervertebral disc degeneration, lumbar region: Secondary | ICD-10-CM | POA: Diagnosis not present

## 2018-03-28 DIAGNOSIS — K080 Exfoliation of teeth due to systemic causes: Secondary | ICD-10-CM | POA: Diagnosis not present

## 2018-04-11 DIAGNOSIS — M7060 Trochanteric bursitis, unspecified hip: Secondary | ICD-10-CM | POA: Diagnosis not present

## 2018-04-11 DIAGNOSIS — M79606 Pain in leg, unspecified: Secondary | ICD-10-CM | POA: Diagnosis not present

## 2018-04-11 DIAGNOSIS — M47816 Spondylosis without myelopathy or radiculopathy, lumbar region: Secondary | ICD-10-CM | POA: Diagnosis not present

## 2018-04-11 DIAGNOSIS — G894 Chronic pain syndrome: Secondary | ICD-10-CM | POA: Diagnosis not present

## 2018-05-08 DIAGNOSIS — Z79899 Other long term (current) drug therapy: Secondary | ICD-10-CM | POA: Diagnosis not present

## 2018-05-08 DIAGNOSIS — G894 Chronic pain syndrome: Secondary | ICD-10-CM | POA: Diagnosis not present

## 2018-05-08 DIAGNOSIS — M5136 Other intervertebral disc degeneration, lumbar region: Secondary | ICD-10-CM | POA: Diagnosis not present

## 2018-05-08 DIAGNOSIS — M25551 Pain in right hip: Secondary | ICD-10-CM | POA: Diagnosis not present

## 2018-05-08 DIAGNOSIS — Z79891 Long term (current) use of opiate analgesic: Secondary | ICD-10-CM | POA: Diagnosis not present

## 2018-05-14 NOTE — Progress Notes (Signed)
Subjective:    Patient ID: Christine Frank, female    DOB: 14-Dec-1965, 52 y.o.   MRN: [id number]  HPI:  Ms. Fritcher presents for f/u: Increasing depression since the suicide of her husband Jan 2019. She vehemently denies of thoughts of harming herself/others, she states "I would never do that to my children". She reports increase in fatigue, lack of interest in the hobbies, poor appetite, increase in somnolence,and sig increase in crying/sorrow. 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 She reports her only real support system is her sister and her local children. Since she works from home, she does not have local friendships. She does not belong to [employer]. She is able to perform ADLs, IADLs, and her work performance has not suffered as a result of her depression. She feels that her pain is well managed per pain clinic She does not exercise and estimates to drink 50 oz water/day She denies tobacco/ETOH use She was previously on Fluoxetine was good response  Depression screen [facility] 2/9 05/16/2018 11/08/2017 07/05/2017  Decreased Interest 2 0 0  Down, Depressed, Hopeless 2 0 0  PHQ - 2 Score 4 0 0  Altered sleeping 2 1 0  Tired, decreased energy 2 1 0  Change in appetite 2 0 1  Feeling bad or failure about yourself  2 0 0  Trouble concentrating 0 0 0  Moving slowly or fidgety/restless 0 0 0  Suicidal thoughts 0 0 0  PHQ-9 Score 12 2 1   Difficult doing work/chores Somewhat difficult Somewhat difficult -   Patient Care Team    Relationship Specialty Notifications Start End  Mina Marble D, NP PCP - General Family Medicine  12/07/16   Meredith Pel, MD Consulting Physician Orthopedic Surgery  12/07/16   Renie Ora, MD Referring Physician Anesthesiology  12/07/16   Anastasio Auerbach, MD [place] Physician Gynecology  12/07/16     Patient Active Problem List   Diagnosis Date Noted  . Depression 05/16/2018   . Grief 05/16/2018  . Vitamin D deficiency 11/08/2017  . Screening for cervical cancer 11/08/2017  . Healthcare maintenance 07/05/2017  . Menstrual migraine with status migrainosus, not intractable 07/05/2017  . Back pain of lumbar region with sciatica 07/05/2017  . Varicose veins of lower extremities with other complications 28/78/6767     Past Medical History:  Diagnosis Date  . Allergy   . Anemia   . Chronic hip pain   . Chronic leg pain    bilateral  . Depression   . Migraine   . PMDD (premenstrual dysphoric disorder)   . Restless leg   . S/P epidural steroid injection    affects legs is seen at pain clinic.     Past Surgical History:  Procedure Laterality Date  . CERVICAL CERCLAGE    . CESAREAN SECTION     x 2  . CESAREAN SECTION  1994  . CESAREAN SECTION  1999     Family History  Problem Relation Age of Onset  . Hypertension Father   . Diabetes Father   . Hyperlipidemia Father   . Alcohol abuse Maternal Grandmother   . Alcohol abuse Maternal Grandfather   . Heart attack Paternal Grandfather   . Cerebral aneurysm Mother   . Cerebral aneurysm Maternal Aunt   . Migraines Maternal Aunt   . Colon cancer Neg Hx   . Esophageal cancer Neg Hx   . Rectal cancer  Neg Hx   . Stomach cancer Neg Hx      Social History   Substance and Sexual Activity  Drug Use No     Social History   Substance and Sexual Activity  Alcohol Use Yes  . Alcohol/week: 0.0 oz   Comment: Rare; 2-3 times per year socially     Social History   Tobacco Use  Smoking Status Former Smoker  Smokeless Tobacco Never Used  Tobacco Comment   tried tobacco a few times as a teen     Outpatient Encounter Medications as of 05/16/2018  Medication Sig Note  . baclofen (LIORESAL) 10 MG tablet Take 1 tablet (10 mg total) by mouth 2 (two) times daily. Take 1/2 to 1 tablet 2 x day if needed for muscle spasm   . butalbital-aspirin-caffeine (FIORINAL) 50-325-40 MG capsule Take 1 capsule by  mouth every 6 (six) hours as needed for headache.   . cetirizine (ZYRTEC) 10 MG tablet TAKE 1 TABLET DAILY FOR ALLERGIES   . diclofenac sodium (VOLTAREN) 1 % GEL Apply 1 application topically 3 (three) times daily as needed.   . docusate sodium (COLACE) 100 MG capsule Take 100 mg by mouth 2 (two) times daily.   . DULoxetine (CYMBALTA) 60 MG capsule Take 1 capsule (60 mg total) by mouth daily.   . EMBEDA 50-2 MG CPCR Take 1 capsule by mouth daily. 12/07/2016: Received from: External Pharmacy  . fluconazole (DIFLUCAN) 150 MG tablet TAKE 1 TABLET BY MOUTH 1 TIME A WEEK AS NEEDED.   . fluticasone (FLONASE) 50 MCG/ACT nasal spray INHALE 1-2 SPRAYS IN EACH NOSTRIL TWICE DAILY   . HYDROcodone-acetaminophen (NORCO) 10-325 MG tablet Take 1 tablet by mouth 3 (three) times daily. 12/07/2016: Received from: External Pharmacy  . ipratropium (ATROVENT) 0.03 % nasal spray PLACE 2 SPRAYS INTO THE NOSE 3 (THREE) TIMES DAILY.   . magnesium gluconate (MAGONATE) 500 MG tablet Take 500 mg by mouth 2 (two) times daily.   . Multiple Vitamin (MULTIVITAMIN) capsule Take 1 capsule by mouth daily.   . promethazine (PHENERGAN) 25 MG tablet Take 1 tablet (25 mg total) by mouth every 6 (six) hours as needed for nausea or vomiting.   . rizatriptan (MAXALT) 10 MG tablet Take 1 tablet (10 mg total) by mouth as needed for migraine. May repeat in 2 hours if needed. Max twice in a day.   Marland Kitchen FLUoxetine (PROZAC) 20 MG capsule Take 1 capsule (20 mg total) by mouth daily.    Facility-Administered Encounter Medications as of 05/16/2018  Medication  . 0.9 %  sodium chloride infusion    Allergies: Sulfa antibiotics  Body mass index is 32.62 kg/m.  Blood pressure 128/80, pulse (!) 106, height 5\' 5"  (1.651 m), weight 196 lb (88.9 kg), SpO2 97 %.   Review of Systems  Constitutional: Positive for activity change, appetite change and fatigue. Negative for chills, diaphoresis and unexpected weight change.  Eyes: Negative for visual  disturbance.  Respiratory: Negative for cough, chest tightness, shortness of breath, wheezing and stridor.   Cardiovascular: Negative for chest pain, palpitations and leg swelling.  Gastrointestinal: Negative for abdominal distention, abdominal pain, constipation, diarrhea, nausea and vomiting.  Endocrine: Negative for cold intolerance, heat intolerance, polydipsia, polyphagia and polyuria.  Musculoskeletal: Positive for arthralgias, back pain and myalgias.  Neurological: Positive for headaches. Negative for dizziness.  Hematological: Does not bruise/bleed easily.  Psychiatric/Behavioral: Positive for dysphoric mood. Negative for agitation, behavioral problems, confusion, decreased concentration, hallucinations, self-injury, sleep disturbance and suicidal ideas. The  patient is not nervous/anxious and is not hyperactive.        Objective:   Physical Exam  Constitutional: She appears well-developed and well-nourished. She appears distressed.  Cardiovascular: Normal rate, regular rhythm, normal heart sounds and intact distal pulses.  No murmur heard. Pulmonary/Chest: Effort normal and breath sounds normal. No stridor. No respiratory distress. She has no wheezes. She has no rales. She exhibits no tenderness.  Skin: Skin is warm and dry. Capillary refill takes less than 2 seconds. No rash noted. She is not diaphoretic. No erythema. No pallor.  Psychiatric: Her speech is normal and behavior is normal. Judgment and thought content normal. Cognition and memory are normal. She exhibits a depressed mood.  Tearful throughout the entire Well groomed- clean hair, well dressed,appropriate for current season          Assessment & Plan:   1. Grief   2. Depression, unspecified depression type     Depression Continue all medications as directed. Please start Fluoxetine (Prozac) 20mg  once daily.   Please follow-up in 4 weeks for re-evaluation. Recommend looking into local "Grief Share" programs to  help with coping with the death of your husband. Increase water intake, strive for at least 80 ounces/day.  Follow Heart Healthy diet Increase regular exercise.  Recommend at least 30 minutes daily, 5 days per week of walking, jogging, biking, swimming, YouTube/Pinterest workout videos.    FOLLOW-UP:  Return in about 1 month (around 06/13/2018) for Regular Follow Up, Evaluate Medication Effectiveness, Depression.

## 2018-05-16 ENCOUNTER — Encounter: Payer: Self-pay | Admitting: Adult Health

## 2018-05-16 ENCOUNTER — Ambulatory Visit (INDEPENDENT_AMBULATORY_CARE_PROVIDER_SITE_OTHER): Payer: Federal, State, Local not specified - PPO | Admitting: Adult Health

## 2018-05-16 VITALS — BP 128/80 | HR 106 | Ht 65.0 in | Wt 196.0 lb

## 2018-05-16 DIAGNOSIS — M5440 Lumbago with sciatica, unspecified side: Secondary | ICD-10-CM

## 2018-05-16 DIAGNOSIS — F329 Major depressive disorder, single episode, unspecified: Secondary | ICD-10-CM

## 2018-05-16 DIAGNOSIS — F32A Depression, unspecified: Secondary | ICD-10-CM

## 2018-05-16 DIAGNOSIS — F4321 Adjustment disorder with depressed mood: Secondary | ICD-10-CM

## 2018-05-16 MED ORDER — FLUOXETINE HCL 20 MG PO CAPS: 20.0000 mg | ORAL_CAPSULE | Freq: Every day | ORAL | 0 refills | Status: DC

## 2018-05-16 NOTE — Assessment & Plan Note (Signed)
Continue all medications as directed. Please start Fluoxetine (Prozac) 20mg  once daily.   Please follow-up in 4 weeks for re-evaluation. Recommend looking into local "Grief Share" programs to help with coping with the death of your husband. Increase water intake, strive for at least 80 ounces/day.  Follow Heart Healthy diet Increase regular exercise.  Recommend at least 30 minutes daily, 5 days per week of walking, jogging, biking, swimming, YouTube/Pinterest workout videos.

## 2018-05-16 NOTE — Addendum Note (Signed)
Addended by: Diana Eves on: 05/16/2018 01:14 PM   Modules accepted: Orders

## 2018-05-16 NOTE — Patient Instructions (Signed)
Complicated Grieving Grief is a normal response to the death of someone close to you. Feelings of fear, anger, and guilt can affect almost everyone who loses a loved one. It is also common to have symptoms of depression while you are grieving. These include problems with sleep, loss of appetite, and lack of energy. They may last for weeks or months after a loss. Complicated grief is different from normal grief or depression. Normal grieving involves sadness and feelings of loss, but these feelings are not constant. Complicated grief is a constant and severe type of grief. It interferes with your ability to function normally. It may last for several months to a year or longer. Complicated grief may require treatment from a mental health care provider. What are the causes? It is not known why some people continue to struggle with grief and others do not. You may be at higher risk for complicated grief if:  The death of your loved one was sudden or unexpected.  The death of your loved one was due to a violent event.  Your loved one committed suicide.  Your loved one was a child or a young person.  You were very close to or dependent on the loved one.  You have a history of depression.  What are the signs or symptoms? Signs and symptoms of complicated grief may include:  Feeling disbelief or numbness.  Being unable to enjoy good memories of your loved one.  Needing to avoid anything that reminds you of your loved one.  Being unable to stop thinking about the death.  Feeling intense anger or guilt.  Feeling alone and hopeless.  Feeling that your life is meaningless and empty.  Losing the desire to live.  How is this diagnosed? Your health care provider may diagnose complicated grief if:  You have constant symptoms of grief for 6-12 months or longer.  Your symptoms are interfering with your ability to live your life.  Your health care provider may want you to see a mental health  care provider. Many symptoms of depression are similar to the symptoms of complicated grief. It is important to be evaluated for complicated grief along with other mental health conditions. How is this treated? Talk therapy with a mental health provider is the most common treatment for complicated grief. During therapy, you will learn healthy ways to cope with the loss of your loved one. In some cases, your mental health care provider may also recommend antidepressant medicines. Follow these instructions at home:  Take care of yourself. ? Eat regular meals and maintain a healthy diet. Eat plenty of fruits, vegetables, and whole grains. ? Try to get some exercise each day. ? Keep regular hours for sleep. Try to get at least 8 hours of sleep each night.  Do not use drugs or alcohol to ease your symptoms.  Take medicines only as directed by your health care provider.  Spend time with friends and loved ones.  Consider joining a grief (bereavement) support group to help you deal with your loss.  Keep all follow-up visits as directed by your health care provider. This is important. Contact a health care provider if:  Your symptoms keep you from functioning normally.  Your symptoms do not get better with treatment. Get help right away if:  You have serious thoughts of hurting yourself or someone else.  You have suicidal feelings. This information is not intended to replace advice given to you by your health care provider. Make sure you   discuss any questions you have with your health care provider. Document Released: 11/13/2005 Document Revised: 04/20/2016 Document Reviewed: 04/23/2014 Elsevier Interactive Patient Education  2018 [place] all medications as directed. Please start Fluoxetine (Prozac) 20mg  once daily.   Please follow-up in 4 weeks for re-evaluation. Recommend looking into local "Grief Share" programs to help with coping with the death of your  husband. Increase water intake, strive for at least 80 ounces/day.   Follow Heart Healthy diet Increase regular exercise.  Recommend at least 30 minutes daily, 5 days per week of walking, jogging, biking, swimming, YouTube/Pinterest workout videos. Please follow-up in 4 weeks.

## 2018-05-24 ENCOUNTER — Ambulatory Visit (INDEPENDENT_AMBULATORY_CARE_PROVIDER_SITE_OTHER): Payer: Federal, State, Local not specified - PPO | Admitting: Adult Health

## 2018-05-24 ENCOUNTER — Ambulatory Visit: Payer: Federal, State, Local not specified - PPO

## 2018-05-24 DIAGNOSIS — M160 Bilateral primary osteoarthritis of hip: Secondary | ICD-10-CM | POA: Diagnosis not present

## 2018-05-24 DIAGNOSIS — M25552 Pain in left hip: Secondary | ICD-10-CM

## 2018-05-24 DIAGNOSIS — M25551 Pain in right hip: Secondary | ICD-10-CM

## 2018-05-24 NOTE — Progress Notes (Signed)
Patient came in today for bilat hip xrays per request from  pain management doctor. MPulliam, CMA/RT(R)

## 2018-05-29 ENCOUNTER — Other Ambulatory Visit: Payer: Self-pay | Admitting: Adult Health

## 2018-06-11 ENCOUNTER — Telehealth: Payer: Self-pay | Admitting: Adult Health

## 2018-06-11 NOTE — Telephone Encounter (Signed)
Patient was started on fluoxetine for depression.  Should pt keep OV on 06/13/18?  Please advise.  Charyl Bigger, CMA

## 2018-06-11 NOTE — Telephone Encounter (Signed)
Patient called states she has an appt for Thursday, July 18th & doesn't feel like it's necessary ---OV scheduled to review new meds effects(per patient Rx is working great for her ).   -Pt wishes Provider to advise if OV for , 06/13/18 needed.  ---Forwarding message to medical assistant to review w/PCP & contact pt with decision.  --glh

## 2018-06-12 NOTE — Telephone Encounter (Signed)
Good Morning Tonya, Can you please call Ms. Absher and confirm that she is not having thoughts of harming herself/others. Can you please ask specifically how her mood has improved. Please ask if she has been seeing a therapist. Please have her make f/u in 2 months Thanks! Valetta Fuller

## 2018-06-12 NOTE — Telephone Encounter (Signed)
Pt denies any thoughts of harming herself or others.  She is not seeing a therapist but has been checking into some programs thru local churches.  Pt states that she now feels like getting out of the bed and doing things around the house.  Advised pt to f/u in 2 months.  Pt expressed understanding and is agreeable.  Charyl Bigger, CMA

## 2018-06-12 NOTE — Telephone Encounter (Signed)
LVM for pt to call to discuss.  T. Brittley Regner, CMA  

## 2018-06-13 ENCOUNTER — Ambulatory Visit: Payer: Federal, State, Local not specified - PPO | Admitting: Adult Health

## 2018-06-16 ENCOUNTER — Emergency Department (HOSPITAL_COMMUNITY)
Admission: EM | Admit: 2018-06-16 | Discharge: 2018-06-16 | Disposition: A | Discharge status: Home / Self Care | Payer: Federal, State, Local not specified - PPO | Attending: Emergency Medicine | Admitting: Emergency Medicine

## 2018-06-16 ENCOUNTER — Other Ambulatory Visit: Payer: Self-pay

## 2018-06-16 ENCOUNTER — Encounter (HOSPITAL_COMMUNITY): Payer: Self-pay

## 2018-06-16 DIAGNOSIS — R112 Nausea with vomiting, unspecified: Secondary | ICD-10-CM | POA: Diagnosis not present

## 2018-06-16 DIAGNOSIS — F329 Major depressive disorder, single episode, unspecified: Secondary | ICD-10-CM | POA: Diagnosis not present

## 2018-06-16 DIAGNOSIS — F1193 Opioid use, unspecified with withdrawal: Secondary | ICD-10-CM | POA: Diagnosis not present

## 2018-06-16 DIAGNOSIS — R109 Unspecified abdominal pain: Secondary | ICD-10-CM | POA: Diagnosis not present

## 2018-06-16 DIAGNOSIS — F1123 Opioid dependence with withdrawal: Secondary | ICD-10-CM | POA: Diagnosis not present

## 2018-06-16 DIAGNOSIS — R197 Diarrhea, unspecified: Secondary | ICD-10-CM | POA: Diagnosis not present

## 2018-06-16 DIAGNOSIS — Z79899 Other long term (current) drug therapy: Secondary | ICD-10-CM | POA: Insufficient documentation

## 2018-06-16 DIAGNOSIS — Z87891 Personal history of nicotine dependence: Secondary | ICD-10-CM | POA: Insufficient documentation

## 2018-06-16 LAB — CBC
HCT: 43.9 % (ref 36.0–46.0)
Hemoglobin: 15.1 g/dL — ABNORMAL HIGH (ref 12.0–15.0)
MCH: 31.9 pg (ref 26.0–34.0)
MCHC: 34.4 g/dL (ref 30.0–36.0)
MCV: 92.6 fL (ref 78.0–100.0)
PLATELETS: 563 10*3/uL — AB (ref 150–400)
RBC: 4.74 MIL/uL (ref 3.87–5.11)
RDW: 12.4 % (ref 11.5–15.5)
WBC: 21.5 10*3/uL — ABNORMAL HIGH (ref 4.0–10.5)

## 2018-06-16 LAB — URINALYSIS, ROUTINE W REFLEX MICROSCOPIC
GLUCOSE, UA: NEGATIVE mg/dL
HGB URINE DIPSTICK: NEGATIVE
Ketones, ur: 40 mg/dL — AB
Leukocytes, UA: NEGATIVE
Nitrite: NEGATIVE
PROTEIN: 30 mg/dL — AB
pH: 5.5 (ref 5.0–8.0)

## 2018-06-16 LAB — COMPREHENSIVE METABOLIC PANEL
ALBUMIN: 4.2 g/dL (ref 3.5–5.0)
ALK PHOS: 93 U/L (ref 38–126)
ALT: 24 U/L (ref 0–44)
ANION GAP: 18 — AB (ref 5–15)
AST: 26 U/L (ref 15–41)
BUN: 10 mg/dL (ref 6–20)
CALCIUM: 9.9 mg/dL (ref 8.9–10.3)
CO2: 20 mmol/L — AB (ref 22–32)
Chloride: 104 mmol/L (ref 98–111)
Creatinine, Ser: 0.74 mg/dL (ref 0.44–1.00)
GFR calc non Af Amer: >60 mL/min (ref >60)
Glucose, Bld: 161 mg/dL — ABNORMAL HIGH (ref 70–99)
POTASSIUM: 3.6 mmol/L (ref 3.5–5.1)
SODIUM: 142 mmol/L (ref 135–145)
Total Bilirubin: 0.6 mg/dL (ref 0.3–1.2)
Total Protein: 8.7 g/dL — ABNORMAL HIGH (ref 6.5–8.1)

## 2018-06-16 LAB — URINALYSIS, MICROSCOPIC (REFLEX)
BACTERIA UA: NONE SEEN
RBC / HPF: NONE SEEN RBC/hpf (ref 0–5)

## 2018-06-16 LAB — HCG, SERUM, QUALITATIVE: Preg, Serum: NEGATIVE

## 2018-06-16 LAB — LIPASE, BLOOD: LIPASE: 25 U/L (ref 11–51)

## 2018-06-16 MED ORDER — LOPERAMIDE HCL 2 MG PO CAPS: 2.0000 mg | ORAL_CAPSULE | Freq: Four times a day (QID) | ORAL | 0 refills | Status: DC | PRN

## 2018-06-16 MED ORDER — SODIUM CHLORIDE 0.9 % IV BOLUS
1000.0000 mL | Freq: Once | INTRAVENOUS | Status: AC
  Administered 2018-06-16: 1000 mL via INTRAVENOUS

## 2018-06-16 MED ORDER — PROMETHAZINE HCL 25 MG/ML IJ SOLN
25.0000 mg | Freq: Once | INTRAMUSCULAR | Status: AC
  Administered 2018-06-16: 25 mg via INTRAVENOUS
  Filled 2018-06-16: qty 1

## 2018-06-16 MED ORDER — ONDANSETRON 4 MG PO TBDP: 4.0000 mg | ORAL_TABLET | Freq: Three times a day (TID) | ORAL | 0 refills | Status: DC | PRN

## 2018-06-16 MED ORDER — ONDANSETRON 4 MG PO TBDP
4.0000 mg | ORAL_TABLET | Freq: Once | ORAL | Status: AC | PRN
  Administered 2018-06-16: 4 mg via ORAL
  Filled 2018-06-16: qty 1

## 2018-06-16 MED ORDER — LOPERAMIDE HCL 2 MG PO CAPS
2.0000 mg | ORAL_CAPSULE | Freq: Once | ORAL | Status: AC
  Administered 2018-06-16: 2 mg via ORAL
  Filled 2018-06-16: qty 1

## 2018-06-16 MED ORDER — HYDROCODONE-ACETAMINOPHEN 10-325 MG PO TABS
1.0000 | ORAL_TABLET | Freq: Once | ORAL | Status: AC
  Administered 2018-06-16: 1 via ORAL
  Filled 2018-06-16: qty 1

## 2018-06-16 MED ORDER — MORPHINE SULFATE (PF) 2 MG/ML IV SOLN
2.0000 mg | Freq: Once | INTRAVENOUS | Status: AC
  Administered 2018-06-16: 2 mg via INTRAVENOUS
  Filled 2018-06-16: qty 1

## 2018-06-16 MED ORDER — METOCLOPRAMIDE HCL 5 MG/ML IJ SOLN
10.0000 mg | Freq: Once | INTRAMUSCULAR | Status: AC
  Administered 2018-06-16: 10 mg via INTRAVENOUS
  Filled 2018-06-16: qty 2

## 2018-06-16 NOTE — ED Triage Notes (Addendum)
PT C/O N/V/D AND ABDOMINAL PAIN SINCE Friday. PT STS SHE ARAN OUT OF HER MIGRAINE MEDICINE ON Friday, AND THINKS SHE IS WITHDRAWING FROM THEM (EMBEDA 50-2MG  AND HYDROCODONE 10/325MG ). PT STS SHE HAS BEEN ON THESE FOR YEARS, AND HAS NEVER RAN OUT. PT ACTIVELY VOMITING IN TRIAGE.

## 2018-06-16 NOTE — ED Notes (Signed)
Patient was witnessed drinking when instructed not to drink.

## 2018-06-16 NOTE — Discharge Instructions (Addendum)
Continue taking home medications as prescribed. Use Zofran as needed for nausea or vomiting. Use Imodium as needed for diarrhea. Do not eat or drink anything tonight, as this will cause continued vomiting. It is very important that you follow-up with your doctor tomorrow for medication refill. Return to the emergency room with any new, worsening, or concerning symptoms.

## 2018-06-16 NOTE — ED Notes (Signed)
Pt has vomited 3x while in room. Pt has recently vomited around [place]. ED provider made aware.

## 2018-06-16 NOTE — ED Notes (Signed)
Pt has been instructed not to drink any fluids or eat anything. RN has provided mouthwash at bedside for patient.

## 2018-06-16 NOTE — ED Provider Notes (Signed)
[facility] DEPT Provider Note   CSN: [id number] Arrival date & time: 06/16/18  1435     History   Chief Complaint Chief Complaint  Patient presents with  . Emesis    HPI Christine Frank is a 51 y.o. female presented for evaluation of nausea, vomiting, diarrhea, and abdominal cramping.  Patient states that she finished her chronic pain medication on Friday, has not had any since.  On Saturday she started to develop nausea and vomiting, since then she has had diarrhea and cramping abdominal pain.  She states she has been on chronic pain medication for several years.  She had an appointment with her pain management doctor on Monday, but had to miss it due to her migraine.  She has been scheduled for tomorrow.  Patient has not been able to tolerate any p.o. since Saturday.  She denies fevers, chills, chest pain, shortness of breath.  She reports decreased urination.,  She denies history of abdominal problems or abdominal surgeries (besides c-sections).    HPI  Past Medical History:  Diagnosis Date  . Allergy   . Anemia   . Chronic hip pain   . Chronic leg pain    bilateral  . Depression   . Migraine   . PMDD (premenstrual dysphoric disorder)   . Restless leg   . S/P epidural steroid injection    affects legs is seen at pain clinic.    Patient Active Problem List   Diagnosis Date Noted  . Depression 05/16/2018  . Grief 05/16/2018  . Vitamin D deficiency 11/08/2017  . Screening for cervical cancer 11/08/2017  . Healthcare maintenance 07/05/2017  . Menstrual migraine with status migrainosus, not intractable 07/05/2017  . Back pain of lumbar region with sciatica 07/05/2017  . Varicose veins of lower extremities with other complications 60/63/0160    Past Surgical History:  Procedure Laterality Date  . CERVICAL CERCLAGE    . CESAREAN SECTION     x 2  . CESAREAN SECTION  1994  . CESAREAN SECTION  1999     OB History    Gravida  10    Para  2   Term  2   Preterm      AB  8   Living  2     SAB  8   TAB      Ectopic      Multiple      Live Births               Home Medications    Prior to Admission medications   Medication Sig Start Date End Date Taking? Authorizing Provider  baclofen (LIORESAL) 10 MG tablet Take 1 tablet (10 mg total) by mouth 2 (two) times daily. Take 1/2 to 1 tablet 2 x day if needed for muscle spasm 10/14/15   Vicie Mutters, PA-C  butalbital-aspirin-caffeine [facility]) [phone] MG capsule Take 1 capsule by mouth every 6 (six) hours as needed for headache. 12/13/17   Melvenia Beam, MD  cetirizine (ZYRTEC) 10 MG tablet TAKE 1 TABLET DAILY FOR ALLERGIES 06/04/17   Danford, Valetta Fuller D, NP  diclofenac sodium (VOLTAREN) 1 % GEL Apply 1 application topically 3 (three) times daily as needed. 08/30/17   [provider]  docusate sodium (COLACE) 100 MG capsule Take 100 mg by mouth 2 (two) times daily.    [provider]  DULoxetine (CYMBALTA) 60 MG capsule TAKE 1 CAPSULE BY MOUTH EVERY DAY 05/29/18   Danford, Berna Spare,  NP  EMBEDA 50-2 MG CPCR Take 1 capsule by mouth daily. 11/30/16   [provider]  fluconazole (DIFLUCAN) 150 MG tablet TAKE 1 TABLET BY MOUTH 1 TIME A WEEK AS NEEDED. 03/08/16   Vicie Mutters, PA-C  FLUoxetine (PROZAC) 20 MG capsule Take 1 capsule (20 mg total) by mouth daily. 05/16/18   Danford, Valetta Fuller D, NP  fluticasone (FLONASE) 50 MCG/ACT nasal spray INHALE 1-2 SPRAYS IN EACH NOSTRIL TWICE DAILY 12/26/17   Danford, Valetta Fuller D, NP  HYDROcodone-acetaminophen (NORCO) 10-325 MG tablet Take 1 tablet by mouth 3 (three) times daily. 11/30/16   [provider]  ipratropium (ATROVENT) 0.03 % nasal spray PLACE 2 SPRAYS INTO THE NOSE 3 (THREE) TIMES DAILY. 08/27/16   Unk Pinto, MD  loperamide (IMODIUM) 2 MG capsule Take 1 capsule (2 mg total) by mouth 4 (four) times daily as needed for diarrhea or loose stools. 06/16/18   Jeffie Widdowson, PA-C  magnesium  gluconate (MAGONATE) 500 MG tablet Take 500 mg by mouth 2 (two) times daily.    [provider]  Multiple Vitamin (MULTIVITAMIN) capsule Take 1 capsule by mouth daily.    [provider]  ondansetron (ZOFRAN ODT) 4 MG disintegrating tablet Take 1 tablet (4 mg total) by mouth every 8 (eight) hours as needed for nausea or vomiting. 06/16/18   Cuahutemoc Attar, PA-C  promethazine (PHENERGAN) 25 MG tablet Take 1 tablet (25 mg total) by mouth every 6 (six) hours as needed for nausea or vomiting. 12/13/17   Melvenia Beam, MD  rizatriptan (MAXALT) 10 MG tablet Take 1 tablet (10 mg total) by mouth as needed for migraine. May repeat in 2 hours if needed. Max twice in a day. 12/13/17   Melvenia Beam, MD    Family History Family History  Problem Relation Age of Onset  . Hypertension Father   . Diabetes Father   . Hyperlipidemia Father   . Alcohol abuse Maternal Grandmother   . Alcohol abuse Maternal Grandfather   . Heart attack Paternal Grandfather   . Cerebral aneurysm Mother   . Cerebral aneurysm Maternal Aunt   . Migraines Maternal Aunt   . Colon cancer Neg Hx   . Esophageal cancer Neg Hx   . Rectal cancer Neg Hx   . Stomach cancer Neg Hx     Social History Social History   Tobacco Use  . Smoking status: Former Research scientist (life sciences)  . Smokeless tobacco: Never Used  . Tobacco comment: tried tobacco a few times as a teen  Substance Use Topics  . Alcohol use: Yes    Alcohol/week: 0.0 oz    Comment: Rare; 2-3 times per year socially  . Drug use: No     Allergies   Sulfa antibiotics   Review of Systems Review of Systems  Gastrointestinal: Positive for abdominal pain, diarrhea, nausea and vomiting.  All other systems reviewed and are negative.    Physical Exam Updated Vital Signs BP 134/81   Pulse (!) 124   Temp 97.6 F (36.4 C) (Oral)   Resp (!) 23   Ht 5\' 4"  (1.626 m)   Wt 86.2 kg (190 lb)   LMP 04/03/2018 (Approximate)   SpO2 97%   BMI 32.61 kg/m    Physical Exam  Constitutional: She is oriented to person, place, and time. She appears well-developed and well-nourished. No distress.  Appears uncomfortable due to symptoms, but no acute distress  HENT:  Head: Normocephalic and atraumatic.  Eyes: Pupils are equal, round, and reactive to  light. Conjunctivae and EOM are normal.  Neck: Normal range of motion. Neck supple.  Cardiovascular: Regular rhythm and intact distal pulses.  Tachycardic around 110  Pulmonary/Chest: Effort normal and breath sounds normal. No respiratory distress. She has no wheezes.  Abdominal: Soft. Bowel sounds are normal. She exhibits no distension and no mass. There is tenderness. There is no rebound and no guarding.  Obese female.  Generalized tenderness palpation of the abdomen without rigidity, guarding, or distention.  Musculoskeletal: Normal range of motion.  Neurological: She is alert and oriented to person, place, and time.  Skin: Skin is warm and dry. Capillary refill takes less than 2 seconds.  Psychiatric: She has a normal mood and affect.  Nursing note and vitals reviewed.    ED Treatments / Results  Labs (all labs ordered are listed, but only abnormal results are displayed) Labs Reviewed  COMPREHENSIVE METABOLIC PANEL - Abnormal; Notable for the following components:      Result Value   CO2 20 (*)    Glucose, Bld 161 (*)    Total Protein 8.7 (*)    Anion gap 18 (*)    All other components within normal limits  CBC - Abnormal; Notable for the following components:   WBC 21.5 (*)    Hemoglobin 15.1 (*)    Platelets 563 (*)    All other components within normal limits  URINALYSIS, ROUTINE W REFLEX MICROSCOPIC - Abnormal; Notable for the following components:   APPearance HAZY (*)    Specific Gravity, Urine >1.030 (*)    Bilirubin Urine SMALL (*)    Ketones, ur 40 (*)    Protein, ur 30 (*)    All other components within normal limits  LIPASE, BLOOD  HCG, SERUM, QUALITATIVE  URINALYSIS,  MICROSCOPIC (REFLEX)    EKG EKG Interpretation  Date/Time:  Sunday June 16 2018 17:43:38 EDT Ventricular Rate:  87 PR Interval:    QRS Duration: 82 QT Interval:  379 QTC Calculation: 456 R Axis:   24 Text Interpretation:  Sinus arrhythmia Abnormal R-wave progression, early transition Confirmed by Dene Gentry [phone]) on 06/16/2018 6:36:04 PM   Radiology No results found.  Procedures Procedures (including critical care time)  Medications Ordered in ED Medications  ondansetron (ZOFRAN-ODT) disintegrating tablet 4 mg (4 mg Oral Given 06/16/18 1503)  metoCLOPramide (REGLAN) injection 10 mg (10 mg Intravenous Given 06/16/18 1555)  loperamide (IMODIUM) capsule 2 mg (2 mg Oral Given 06/16/18 1555)  HYDROcodone-acetaminophen (NORCO) 10-325 MG per tablet 1 tablet (1 tablet Oral Given 06/16/18 1608)  morphine 2 MG/ML injection 2 mg (2 mg Intravenous Given 06/16/18 1608)  sodium chloride 0.9 % bolus 1,000 mL (0 mLs Intravenous Stopped 06/16/18 1751)  promethazine (PHENERGAN) injection 25 mg (25 mg Intravenous Given 06/16/18 1801)     Initial Impression / Assessment and Plan / ED Course  I have reviewed the triage vital signs and the nursing notes.  Pertinent labs & imaging results that were available during my care of the patient were reviewed by me and considered in my medical decision making (see chart for details).     Presenting for evaluation of nausea, vomiting, diarrhea, and abdominal cramping.  Has been on long-term narcotic use, finished her last dose on Friday.  Symptoms began on Saturday.  No associated fevers or chills.  Doubt infection or intra-abdominal pathology, likely related to withdrawal.  Will obtain basic labs, treat symptomatically, and reassess.  1 dose of patient's at home pain medications given.  Labs show leukocytosis at  21, otherwise reassuring.  I still doubt this is due to infection.  I do not believe CT scan is necessary at this time.  On reassessment, patient  has improvement of symptoms with Zofran and Imodium, although not resolution of the vomiting.  Gentle fluids going.  Heart rate has improved.  UA pending.  Reglan given for further nausea control.  Patient reports continued intermittent vomiting.  Was instructed to be n.p.o., but continues to drink water despite instructions.  Discussed with patient that she needs to remain n.p.o.  Phenergan given after EKG shows no prolongation of QT.  UA pending.  Urine shows dehydration without obvious infection.  On reevaluation, patient appears much more comfortable, is walking around the room in no apparent distress.  Discussed likely etiology of symptoms.  Discussed symptom medic treatment with antiemetics and Imodium.  Discussed importance of follow-up with PCP for indication refill and further evaluation of symptoms.  At this time, patient appears a for discharge.  Return precautions given.  Patient states she understands and agrees plan.  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

## 2018-06-16 NOTE — ED Notes (Signed)
ED Provider at bedside. 

## 2018-06-17 DIAGNOSIS — M5136 Other intervertebral disc degeneration, lumbar region: Secondary | ICD-10-CM | POA: Diagnosis not present

## 2018-06-17 DIAGNOSIS — Z79891 Long term (current) use of opiate analgesic: Secondary | ICD-10-CM | POA: Diagnosis not present

## 2018-06-17 DIAGNOSIS — G894 Chronic pain syndrome: Secondary | ICD-10-CM | POA: Diagnosis not present

## 2018-06-17 DIAGNOSIS — Z79899 Other long term (current) drug therapy: Secondary | ICD-10-CM | POA: Diagnosis not present

## 2018-06-20 ENCOUNTER — Other Ambulatory Visit: Payer: Self-pay | Admitting: Pain Medicine

## 2018-06-20 DIAGNOSIS — M25552 Pain in left hip: Principal | ICD-10-CM

## 2018-06-20 DIAGNOSIS — M25551 Pain in right hip: Secondary | ICD-10-CM

## 2018-06-21 ENCOUNTER — Ambulatory Visit: Payer: Federal, State, Local not specified - PPO

## 2018-07-12 ENCOUNTER — Other Ambulatory Visit: Payer: Self-pay | Admitting: Adult Health

## 2018-07-17 NOTE — Progress Notes (Signed)
Subjective:    Patient ID: Christine Frank, female    DOB: 1966/09/28, 52 y.o.   MRN: [id number]  HPI"  Christine Frank presents with urinary frequency and dysuria that started 6 days ago. She also reports "cloudy looking urine that last two days" She has been increasing water and using OTC AZO- with minimal sx relief. She denies N/V/D/flank pain She denies abdominal pain She denies hematuria/hematochezia She denies fever/poor appetite She has had nephrolithiasis in past, however this does not feel the same in nature. She has been reducing saturated fat/sugar/CHO and has lost >7 lbs since the last OV 2 months ago- GREAT JOB!  Patient Care Team    Relationship Specialty Notifications Start End  Mina Marble D, NP PCP - General Family Medicine  12/07/16   Meredith Pel, MD Consulting Physician Orthopedic Surgery  12/07/16   Renie Ora, MD Referring Physician Anesthesiology  12/07/16   Anastasio Auerbach, MD Consulting Physician Gynecology  12/07/16     Patient Active Problem List   Diagnosis Date Noted  . Dysuria 07/18/2018  . Depression 05/16/2018  . Grief 05/16/2018  . Vitamin D deficiency 11/08/2017  . Screening for cervical cancer 11/08/2017  . Healthcare maintenance 07/05/2017  . Menstrual migraine with status migrainosus, not intractable 07/05/2017  . Back pain of lumbar region with sciatica 07/05/2017  . Varicose veins of lower extremities with other complications 46/56/8127     Past Medical History:  Diagnosis Date  . Allergy   . Anemia   . Chronic hip pain   . Chronic leg pain    bilateral  . Depression   . Migraine   . PMDD (premenstrual dysphoric disorder)   . Restless leg   . S/P epidural steroid injection    affects legs is seen at pain clinic.     Past Surgical History:  Procedure Laterality Date  . CERVICAL CERCLAGE    . CESAREAN SECTION     x 2  . CESAREAN SECTION  1994  . CESAREAN SECTION  1999     Family History  Problem  Relation Age of Onset  . Hypertension Father   . Diabetes Father   . Hyperlipidemia Father   . Alcohol abuse Maternal Grandmother   . Alcohol abuse Maternal Grandfather   . Heart attack Paternal Grandfather   . Cerebral aneurysm Mother   . Cerebral aneurysm Maternal Aunt   . Migraines Maternal Aunt   . Colon cancer Neg Hx   . Esophageal cancer Neg Hx   . Rectal cancer Neg Hx   . Stomach cancer Neg Hx      Social History   Substance and Sexual Activity  Drug Use No     Social History   Substance and Sexual Activity  Alcohol Use Yes  . Alcohol/week: 0.0 standard drinks   Comment: Rare; 2-3 times per year socially     Social History   Tobacco Use  Smoking Status Former Smoker  Smokeless Tobacco Never Used  Tobacco Comment   tried tobacco a few times as a teen     Outpatient Encounter Medications as of 07/18/2018  Medication Sig Note  . baclofen (LIORESAL) 10 MG tablet Take 1 tablet (10 mg total) by mouth 2 (two) times daily. Take 1/2 to 1 tablet 2 x day if needed for muscle spasm   . butalbital-aspirin-caffeine (FIORINAL) 50-325-40 MG capsule Take 1 capsule by mouth every 6 (six) hours as needed for headache.   . cetirizine (ZYRTEC) 10 MG  tablet TAKE 1 TABLET DAILY FOR ALLERGIES   . diclofenac sodium (VOLTAREN) 1 % GEL Apply 1 application topically 3 (three) times daily as needed.   . docusate sodium (COLACE) 100 MG capsule Take 100 mg by mouth 2 (two) times daily.   . DULoxetine (CYMBALTA) 60 MG capsule TAKE 1 CAPSULE BY MOUTH EVERY DAY   . EMBEDA 50-2 MG CPCR Take 1 capsule by mouth daily. 12/07/2016: Received from: External Pharmacy  . fluconazole (DIFLUCAN) 150 MG tablet TAKE 1 TABLET BY MOUTH 1 TIME A WEEK AS NEEDED.   Marland Kitchen FLUoxetine (PROZAC) 20 MG capsule TAKE 1 CAPSULE BY MOUTH EVERY DAY   . fluticasone (FLONASE) 50 MCG/ACT nasal spray INHALE 1-2 SPRAYS IN EACH NOSTRIL TWICE DAILY   . HYDROcodone-acetaminophen (NORCO) 10-325 MG tablet Take 1 tablet by mouth 3  (three) times daily. 12/07/2016: Received from: External Pharmacy  . ipratropium (ATROVENT) 0.03 % nasal spray PLACE 2 SPRAYS INTO THE NOSE 3 (THREE) TIMES DAILY.   Marland Kitchen loperamide (IMODIUM) 2 MG capsule Take 1 capsule (2 mg total) by mouth 4 (four) times daily as needed for diarrhea or loose stools.   . magnesium gluconate (MAGONATE) 500 MG tablet Take 500 mg by mouth 2 (two) times daily.   . Multiple Vitamin (MULTIVITAMIN) capsule Take 1 capsule by mouth daily.   . ondansetron (ZOFRAN ODT) 4 MG disintegrating tablet Take 1 tablet (4 mg total) by mouth every 8 (eight) hours as needed for nausea or vomiting.   . promethazine (PHENERGAN) 25 MG tablet Take 1 tablet (25 mg total) by mouth every 6 (six) hours as needed for nausea or vomiting.   . rizatriptan (MAXALT) 10 MG tablet Take 1 tablet (10 mg total) by mouth as needed for migraine. May repeat in 2 hours if needed. Max twice in a day.   . phenazopyridine (PYRIDIUM) 200 MG tablet Take 1 tablet (200 mg total) by mouth 3 (three) times daily with meals. Take for two days    Facility-Administered Encounter Medications as of 07/18/2018  Medication  . 0.9 %  sodium chloride infusion    Allergies: Sulfa antibiotics  Body mass index is 31.52 kg/m.  Blood pressure 128/82, pulse 97, temperature 98.3 F (36.8 C), temperature source Oral, height 5\' 5"  (1.651 m), weight 189 lb 6.4 oz (85.9 kg), SpO2 97 %.  Review of Systems  Constitutional: Positive for fatigue. Negative for activity change, appetite change, chills, diaphoresis, fever and unexpected weight change.  Eyes: Negative for visual disturbance.  Respiratory: Negative for cough, chest tightness, shortness of breath, wheezing and stridor.   Cardiovascular: Negative for chest pain, palpitations and leg swelling.  Gastrointestinal: Negative for abdominal distention, abdominal pain, blood in stool, constipation, diarrhea, nausea and vomiting.  Endocrine: Negative for cold intolerance, heat  intolerance, polydipsia, polyphagia and polyuria.  Genitourinary: Positive for dysuria and frequency. Negative for difficulty urinating, enuresis, flank pain, hematuria, pelvic pain and urgency.  Musculoskeletal: Negative for back pain.  Psychiatric/Behavioral: Negative for sleep disturbance.       Objective:   Physical Exam  Constitutional: She is oriented to person, place, and time. She appears well-developed and well-nourished. No distress.  HENT:  Head: Normocephalic and atraumatic.  Right Ear: External ear normal.  Left Ear: External ear normal.  Nose: Nose normal.  Mouth/Throat: Oropharynx is clear and moist.  Eyes: Pupils are equal, round, and reactive to light. Conjunctivae and EOM are normal.  Cardiovascular: Normal rate, regular rhythm, normal heart sounds and intact distal pulses.  No murmur  heard. Pulmonary/Chest: Effort normal and breath sounds normal. No stridor. No respiratory distress. She has no wheezes. She has no rales. She exhibits no tenderness.  Abdominal: Soft. Bowel sounds are normal. There is no tenderness. There is no CVA tenderness, no tenderness at McBurney's point and negative Murphy's sign.  Neurological: She is alert and oriented to person, place, and time.  Skin: Skin is warm and dry. Capillary refill takes less than 2 seconds. No rash noted. She is not diaphoretic. No erythema. No pallor.  Psychiatric: She has a normal mood and affect. Her behavior is normal. Judgment and thought content normal.  Nursing note and vitals reviewed.     Assessment & Plan:   1. Dysuria     Dysuria Urinalysis is normal. Please take Pyridium 200mg  threes times days with meals, take for 2 days. Continue to push fluids. If symptoms persist into next week, please return for nurse visit for another urinalysis.  FOLLOW-UP:  Return if symptoms worsen or fail to improve.

## 2018-07-18 ENCOUNTER — Ambulatory Visit (INDEPENDENT_AMBULATORY_CARE_PROVIDER_SITE_OTHER): Payer: Federal, State, Local not specified - PPO | Admitting: Adult Health

## 2018-07-18 ENCOUNTER — Encounter: Payer: Self-pay | Admitting: Adult Health

## 2018-07-18 VITALS — BP 128/82 | HR 97 | Temp 98.3°F | Ht 65.0 in | Wt 189.4 lb

## 2018-07-18 DIAGNOSIS — R300 Dysuria: Secondary | ICD-10-CM

## 2018-07-18 DIAGNOSIS — M47816 Spondylosis without myelopathy or radiculopathy, lumbar region: Secondary | ICD-10-CM | POA: Diagnosis not present

## 2018-07-18 DIAGNOSIS — M79606 Pain in leg, unspecified: Secondary | ICD-10-CM | POA: Diagnosis not present

## 2018-07-18 DIAGNOSIS — M7060 Trochanteric bursitis, unspecified hip: Secondary | ICD-10-CM | POA: Diagnosis not present

## 2018-07-18 DIAGNOSIS — G894 Chronic pain syndrome: Secondary | ICD-10-CM | POA: Diagnosis not present

## 2018-07-18 LAB — POCT URINALYSIS DIPSTICK
Bilirubin, UA: NEGATIVE
Blood, UA: NEGATIVE
Glucose, UA: NEGATIVE
Ketones, UA: NEGATIVE
LEUKOCYTES UA: NEGATIVE
NITRITE UA: NEGATIVE
PROTEIN UA: NEGATIVE
Spec Grav, UA: 1.025 (ref 1.010–1.025)
Urobilinogen, UA: 0.2 E.U./dL
pH, UA: 6 (ref 5.0–8.0)

## 2018-07-18 MED ORDER — PHENAZOPYRIDINE HCL 200 MG PO TABS: 200.0000 mg | ORAL_TABLET | Freq: Three times a day (TID) | ORAL | 0 refills | Status: DC

## 2018-07-18 NOTE — Assessment & Plan Note (Addendum)
Urinalysis is normal. Please take Pyridium 200mg  threes times days with meals, take for 2 days. Continue to push fluids. If symptoms persist into next week, please return for nurse visit for another urinalysis.

## 2018-07-18 NOTE — Patient Instructions (Signed)
Dysuria Dysuria is pain or discomfort while urinating. The pain or discomfort may be felt in the tube that carries urine out of the bladder (urethra) or in the surrounding tissue of the genitals. The pain may also be felt in the groin area, lower abdomen, and lower back. You may have to urinate frequently or have the sudden feeling that you have to urinate (urgency). Dysuria can affect both men and women, but is more common in women. Dysuria can be caused by many different things, including:  Urinary tract infection in women.  Infection of the kidney or bladder.  Kidney stones or bladder stones.  Certain sexually transmitted infections (STIs), such as chlamydia.  Dehydration.  Inflammation of the vagina.  Use of certain medicines.  Use of certain soaps or scented products that cause irritation.  Follow these instructions at home: Watch your dysuria for any changes. The following actions may help to reduce any discomfort you are feeling:  Drink enough fluid to keep your urine clear or pale yellow.  Empty your bladder often. Avoid holding urine for long periods of time.  After a bowel movement or urination, women should cleanse from front to back, using each tissue only once.  Empty your bladder after sexual intercourse.  Take medicines only as directed by your health care provider.  If you were prescribed an antibiotic medicine, finish it all even if you start to feel better.  Avoid caffeine, tea, and alcohol. They can irritate the bladder and make dysuria worse. In men, alcohol may irritate the prostate.  Keep all follow-up visits as directed by your health care provider. This is important.  If you had any tests done to find the cause of dysuria, it is your responsibility to obtain your test results. Ask the lab or department performing the test when and how you will get your results. Talk with your health care provider if you have any questions about your results.  Contact a  health care provider if:  You develop pain in your back or sides.  You have a fever.  You have nausea or vomiting.  You have blood in your urine.  You are not urinating as often as you usually do. Get help right away if:  You pain is severe and not relieved with medicines.  You are unable to hold down any fluids.  You or someone else notices a change in your mental function.  You have a rapid heartbeat at rest.  You have shaking or chills.  You feel extremely weak. This information is not intended to replace advice given to you by your health care provider. Make sure you discuss any questions you have with your health care provider. Document Released: 08/11/2004 Document Revised: 04/20/2016 Document Reviewed: 07/09/2014 Elsevier Interactive Patient Education  [employer].  Urinalysis is normal. Please take Pyridium 200mg  threes times days with meals, take for 2 days. Continue to push fluids. If symptoms persist into next week, please return for nurse visit for another urinalysis. GREAT JOB ON THE WEIGHT LOSS! FEEL BETTER!

## 2018-07-24 ENCOUNTER — Telehealth: Payer: Self-pay | Admitting: Adult Health

## 2018-07-24 NOTE — Telephone Encounter (Signed)
Patient called states was in last week for what thought was a UTI (it was r/o)---- Patient still having symptoms and now addt'l symptoms & thinks it now a Yeast infection--- Pt request provider call in Rx to her pharmacy.  Pt uses :  [facility]      [id number] [address], [place]. [phone] (Phone) [phone] (Fax)   --forwarding message to medical assistant.  --Dion Body

## 2018-07-25 ENCOUNTER — Other Ambulatory Visit: Payer: Self-pay | Admitting: Adult Health

## 2018-07-25 ENCOUNTER — Telehealth: Payer: Self-pay | Admitting: Adult Health

## 2018-07-25 DIAGNOSIS — M25552 Pain in left hip: Principal | ICD-10-CM

## 2018-07-25 DIAGNOSIS — M545 Low back pain, unspecified: Secondary | ICD-10-CM

## 2018-07-25 DIAGNOSIS — M25551 Pain in right hip: Secondary | ICD-10-CM

## 2018-07-25 MED ORDER — FLUCONAZOLE 150 MG PO TABS: 150.0000 mg | ORAL_TABLET | Freq: Once | ORAL | 0 refills | Status: AC

## 2018-07-25 NOTE — Progress Notes (Signed)
RX called to pharmacy since transmission failed.  Charyl Bigger, CMA

## 2018-07-25 NOTE — Telephone Encounter (Signed)
Afternoon Tonya, I sent in new referral, with a note explaining that she wants to transfer from her current pain management clinic to a new practice. Thanks! Valetta Fuller

## 2018-07-25 NOTE — Telephone Encounter (Signed)
Pt is requesting referral to a different pain management clinic.  Pt states that her current pain management provider is making her "jump thru hoops" to get lots of different testing done that she cannot afford.  Please advise.  Charyl Bigger, CMA

## 2018-07-25 NOTE — Telephone Encounter (Signed)
Pt c/o "cottage cheese" vaginal discharge and itching.  Pt states that she cannot use OTC meds because they cause more discomfort and irritation.  Pt request RX for Diflucan.  Please advise.  Charyl Bigger, CMA

## 2018-07-25 NOTE — Telephone Encounter (Signed)
Pt informed.  T. Arber Wiemers, CMA 

## 2018-07-25 NOTE — Telephone Encounter (Signed)
Patient called states was dissatisfied wit [facility] and doesn't want to return there for any treatment--- Patient wishes a new referral to  :  Dr. Brandy Hale   (pvt practice )  [place], [place]   ph# [phone].  --forwarding request to medical assistant.  -glh

## 2018-07-25 NOTE — Telephone Encounter (Signed)
Diflucan rx sent in. Thanks! Valetta Fuller

## 2018-07-26 ENCOUNTER — Other Ambulatory Visit: Payer: Self-pay | Admitting: Adult Health

## 2018-08-01 ENCOUNTER — Other Ambulatory Visit: Payer: Federal, State, Local not specified - PPO

## 2018-08-08 DIAGNOSIS — M5136 Other intervertebral disc degeneration, lumbar region: Secondary | ICD-10-CM | POA: Diagnosis not present

## 2018-08-08 DIAGNOSIS — M545 Low back pain: Secondary | ICD-10-CM | POA: Diagnosis not present

## 2018-08-08 DIAGNOSIS — M25551 Pain in right hip: Secondary | ICD-10-CM | POA: Diagnosis not present

## 2018-08-12 ENCOUNTER — Other Ambulatory Visit: Payer: Self-pay | Admitting: Neurology

## 2018-08-15 ENCOUNTER — Ambulatory Visit: Payer: Federal, State, Local not specified - PPO | Admitting: Adult Health

## 2018-08-15 DIAGNOSIS — M7060 Trochanteric bursitis, unspecified hip: Secondary | ICD-10-CM | POA: Diagnosis not present

## 2018-08-15 DIAGNOSIS — M79606 Pain in leg, unspecified: Secondary | ICD-10-CM | POA: Diagnosis not present

## 2018-08-15 DIAGNOSIS — Z79891 Long term (current) use of opiate analgesic: Secondary | ICD-10-CM | POA: Diagnosis not present

## 2018-08-15 DIAGNOSIS — M47816 Spondylosis without myelopathy or radiculopathy, lumbar region: Secondary | ICD-10-CM | POA: Diagnosis not present

## 2018-08-15 DIAGNOSIS — Z79899 Other long term (current) drug therapy: Secondary | ICD-10-CM | POA: Diagnosis not present

## 2018-08-15 DIAGNOSIS — G894 Chronic pain syndrome: Secondary | ICD-10-CM | POA: Diagnosis not present

## 2018-08-22 ENCOUNTER — Other Ambulatory Visit: Payer: Federal, State, Local not specified - PPO

## 2018-08-29 ENCOUNTER — Other Ambulatory Visit: Payer: Self-pay | Admitting: Adult Health

## 2018-08-29 ENCOUNTER — Ambulatory Visit: Payer: Federal, State, Local not specified - PPO | Admitting: Adult Health

## 2018-08-30 DIAGNOSIS — N200 Calculus of kidney: Secondary | ICD-10-CM | POA: Diagnosis not present

## 2018-08-30 DIAGNOSIS — N201 Calculus of ureter: Secondary | ICD-10-CM | POA: Diagnosis not present

## 2018-09-05 DIAGNOSIS — M5136 Other intervertebral disc degeneration, lumbar region: Secondary | ICD-10-CM | POA: Diagnosis not present

## 2018-09-08 ENCOUNTER — Other Ambulatory Visit: Payer: Self-pay | Admitting: Adult Health

## 2018-09-09 ENCOUNTER — Telehealth: Payer: Self-pay | Admitting: Adult Health

## 2018-09-09 NOTE — Telephone Encounter (Signed)
Patient says has too much going on w/ Son's illness and wonders if 10/17 app w/ provider is necessary-- per patient symptoms have subsided & Rx is working w/ weight loss, she has been traveling back & forth to [place] w/son due to his illness & needs a break from doctors.  --forwarding message to medical assistant to contact pt with advise@  [phone].  --glh

## 2018-09-09 NOTE — Telephone Encounter (Signed)
Good Afternoon Tonya, Can you please call Ms. Trillo and confirm which rx she needs refilled and how she is tolerating, etc Thanks! Valetta Fuller

## 2018-09-09 NOTE — Telephone Encounter (Signed)
Please advise if this is acceptable with you.  Christine Frank, CMA

## 2018-09-10 NOTE — Telephone Encounter (Signed)
Pt was referring to fluoxetine.  Pt states that she is tolerating the medication without any problems.  Please advise.  Charyl Bigger, CMA

## 2018-09-11 NOTE — Progress Notes (Deleted)
Subjective:    Patient ID: Christine Frank, female    DOB: 1966/06/29, 52 y.o.   MRN: [id number]  HPI: 05/16/18 OV:  Ms. Christine Frank presents for f/u: Increasing depression since the suicide of her husband Jan 2019. She vehemently denies of thoughts of harming herself/others, she states "I would never do that to my children". She reports increase in fatigue, lack of interest in the hobbies, poor appetite, increase in somnolence,and sig increase in crying/sorrow. 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 She reports her only real support system is her sister and her local children. Since she works from home, she does not have local friendships. She does not belong to [employer]. She is able to perform ADLs, IADLs, and her work performance has not suffered as a result of her depression. She feels that her pain is well managed per pain clinic She does not exercise and estimates to drink 50 oz water/day She denies tobacco/ETOH use She was previously on Fluoxetine was good response  09/12/18 OV: Ms. Christine Frank is here for f/u: GAD, depression She was started on Fluoxetine 20mg  June 2019 She has been on Duloxetine 60mg  QD for x years for fibromyalgia  Patient Care Team    Relationship Specialty Notifications Start End  Mina Marble D, NP PCP - General Family Medicine  12/07/16   Meredith Pel, MD Consulting Physician Orthopedic Surgery  12/07/16   Renie Ora, MD Referring Physician Anesthesiology  12/07/16   Anastasio Auerbach, MD Consulting Physician Gynecology  12/07/16     Patient Active Problem List   Diagnosis Date Noted  . Dysuria 07/18/2018  . Depression 05/16/2018  . Grief 05/16/2018  . Vitamin D deficiency 11/08/2017  . Screening for cervical cancer 11/08/2017  . Healthcare maintenance 07/05/2017  . Menstrual migraine with status migrainosus, not intractable 07/05/2017  . Back pain of lumbar region with  sciatica 07/05/2017  . Varicose veins of lower extremities with other complications 54/27/0623     Past Medical History:  Diagnosis Date  . Allergy   . Anemia   . Chronic hip pain   . Chronic leg pain    bilateral  . Depression   . Migraine   . PMDD (premenstrual dysphoric disorder)   . Restless leg   . S/P epidural steroid injection    affects legs is seen at pain clinic.     Past Surgical History:  Procedure Laterality Date  . CERVICAL CERCLAGE    . CESAREAN SECTION     x 2  . CESAREAN SECTION  1994  . CESAREAN SECTION  1999     Family History  Problem Relation Age of Onset  . Hypertension Father   . Diabetes Father   . Hyperlipidemia Father   . Alcohol abuse Maternal Grandmother   . Alcohol abuse Maternal Grandfather   . Heart attack Paternal Grandfather   . Cerebral aneurysm Mother   . Cerebral aneurysm Maternal Aunt   . Migraines Maternal Aunt   . Colon cancer Neg Hx   . Esophageal cancer Neg Hx   . Rectal cancer Neg Hx   . Stomach cancer Neg Hx      Social History   Substance and Sexual Activity  Drug Use No     Social History   Substance and Sexual Activity  Alcohol Use Yes  . Alcohol/week: 0.0 standard drinks   Comment: Rare; 2-3 times per year socially  Social History   Tobacco Use  Smoking Status Former Smoker  Smokeless Tobacco Never Used  Tobacco Comment   tried tobacco a few times as a teen     Outpatient Encounter Medications as of 09/12/2018  Medication Sig Note  . baclofen (LIORESAL) 10 MG tablet Take 1 tablet (10 mg total) by mouth 2 (two) times daily. Take 1/2 to 1 tablet 2 x day if needed for muscle spasm   . butalbital-aspirin-caffeine (FIORINAL) 50-325-40 MG capsule TAKE 1 CAPSULE BY MOUTH EVERY 6 HOURS AS NEEDED FOR HEADACHE   . cetirizine (ZYRTEC) 10 MG tablet TAKE 1 TABLET DAILY FOR ALLERGIES   . diclofenac sodium (VOLTAREN) 1 % GEL Apply 1 application topically 3 (three) times daily as needed.   . docusate  sodium (COLACE) 100 MG capsule Take 100 mg by mouth 2 (two) times daily.   . DULoxetine (CYMBALTA) 60 MG capsule Take 1 capsule (60 mg total) by mouth daily. PATIENT MUST HAVE OFFICE VISIT PRIOR TO ANY FURTHER REFILLS   . EMBEDA 50-2 MG CPCR Take 1 capsule by mouth daily. 12/07/2016: Received from: External Pharmacy  . FLUoxetine (PROZAC) 20 MG capsule TAKE 1 CAPSULE BY MOUTH EVERY DAY   . fluticasone (FLONASE) 50 MCG/ACT nasal spray INHALE 1-2 SPRAYS IN EACH NOSTRIL TWICE DAILY   . HYDROcodone-acetaminophen (NORCO) 10-325 MG tablet Take 1 tablet by mouth 3 (three) times daily. 12/07/2016: Received from: External Pharmacy  . ipratropium (ATROVENT) 0.03 % nasal spray PLACE 2 SPRAYS INTO THE NOSE 3 (THREE) TIMES DAILY.   Marland Kitchen loperamide (IMODIUM) 2 MG capsule Take 1 capsule (2 mg total) by mouth 4 (four) times daily as needed for diarrhea or loose stools.   . magnesium gluconate (MAGONATE) 500 MG tablet Take 500 mg by mouth 2 (two) times daily.   . Multiple Vitamin (MULTIVITAMIN) capsule Take 1 capsule by mouth daily.   . ondansetron (ZOFRAN ODT) 4 MG disintegrating tablet Take 1 tablet (4 mg total) by mouth every 8 (eight) hours as needed for nausea or vomiting.   . phenazopyridine (PYRIDIUM) 200 MG tablet Take 1 tablet (200 mg total) by mouth 3 (three) times daily with meals. Take for two days   . promethazine (PHENERGAN) 25 MG tablet Take 1 tablet (25 mg total) by mouth every 6 (six) hours as needed for nausea or vomiting.   . rizatriptan (MAXALT) 10 MG tablet Take 1 tablet (10 mg total) by mouth as needed for migraine. May repeat in 2 hours if needed. Max twice in a day.    Facility-Administered Encounter Medications as of 09/12/2018  Medication  . 0.9 %  sodium chloride infusion    Allergies: Sulfa antibiotics  There is no height or weight on file to calculate BMI.  There were no vitals taken for this visit.   Review of Systems  Constitutional: Positive for activity change, appetite change  and fatigue. Negative for chills, diaphoresis and unexpected weight change.  Eyes: Negative for visual disturbance.  Respiratory: Negative for cough, chest tightness, shortness of breath, wheezing and stridor.   Cardiovascular: Negative for chest pain, palpitations and leg swelling.  Gastrointestinal: Negative for abdominal distention, abdominal pain, constipation, diarrhea, nausea and vomiting.  Endocrine: Negative for cold intolerance, heat intolerance, polydipsia, polyphagia and polyuria.  Musculoskeletal: Positive for arthralgias, back pain and myalgias.  Neurological: Positive for headaches. Negative for dizziness.  Hematological: Does not bruise/bleed easily.  Psychiatric/Behavioral: Positive for dysphoric mood. Negative for agitation, behavioral problems, confusion, decreased concentration, hallucinations, self-injury, sleep disturbance and  suicidal ideas. The patient is not nervous/anxious and is not hyperactive.        Objective:   Physical Exam  Constitutional: She appears well-developed and well-nourished. She appears distressed.  Cardiovascular: Normal rate, regular rhythm, normal heart sounds and intact distal pulses.  No murmur heard. Pulmonary/Chest: Effort normal and breath sounds normal. No stridor. No respiratory distress. She has no wheezes. She has no rales. She exhibits no tenderness.  Skin: Skin is warm and dry. Capillary refill takes less than 2 seconds. No rash noted. She is not diaphoretic. No erythema. No pallor.  Psychiatric: Her speech is normal and behavior is normal. Judgment and thought content normal. Cognition and memory are normal. She exhibits a depressed mood.  Tearful throughout the entire Well groomed- clean hair, well dressed,appropriate for current season          Assessment & Plan:   No diagnosis found.  No problem-specific Assessment & Plan notes found for this encounter.    FOLLOW-UP:  No follow-ups on file.

## 2018-09-12 ENCOUNTER — Ambulatory Visit: Payer: Federal, State, Local not specified - PPO | Admitting: Adult Health

## 2018-09-12 DIAGNOSIS — M47816 Spondylosis without myelopathy or radiculopathy, lumbar region: Secondary | ICD-10-CM | POA: Diagnosis not present

## 2018-09-12 DIAGNOSIS — G894 Chronic pain syndrome: Secondary | ICD-10-CM | POA: Diagnosis not present

## 2018-09-12 DIAGNOSIS — M79606 Pain in leg, unspecified: Secondary | ICD-10-CM | POA: Diagnosis not present

## 2018-09-12 DIAGNOSIS — M7060 Trochanteric bursitis, unspecified hip: Secondary | ICD-10-CM | POA: Diagnosis not present

## 2018-09-26 ENCOUNTER — Ambulatory Visit
Admission: RE | Admit: 2018-09-26 | Discharge: 2018-09-26 | Disposition: A | Discharge status: Home / Self Care | Payer: Federal, State, Local not specified - PPO | Source: Ambulatory Visit | Attending: Pain Medicine | Admitting: Pain Medicine

## 2018-09-26 DIAGNOSIS — M160 Bilateral primary osteoarthritis of hip: Secondary | ICD-10-CM | POA: Diagnosis not present

## 2018-09-26 DIAGNOSIS — M25552 Pain in left hip: Principal | ICD-10-CM

## 2018-09-26 DIAGNOSIS — M25551 Pain in right hip: Secondary | ICD-10-CM

## 2018-10-03 ENCOUNTER — Other Ambulatory Visit: Payer: Self-pay | Admitting: Adult Health

## 2018-10-10 DIAGNOSIS — G894 Chronic pain syndrome: Secondary | ICD-10-CM | POA: Diagnosis not present

## 2018-10-10 DIAGNOSIS — M7060 Trochanteric bursitis, unspecified hip: Secondary | ICD-10-CM | POA: Diagnosis not present

## 2018-10-10 DIAGNOSIS — Z79891 Long term (current) use of opiate analgesic: Secondary | ICD-10-CM | POA: Diagnosis not present

## 2018-10-10 DIAGNOSIS — M79606 Pain in leg, unspecified: Secondary | ICD-10-CM | POA: Diagnosis not present

## 2018-10-10 DIAGNOSIS — M47816 Spondylosis without myelopathy or radiculopathy, lumbar region: Secondary | ICD-10-CM | POA: Diagnosis not present

## 2018-10-10 DIAGNOSIS — Z79899 Other long term (current) drug therapy: Secondary | ICD-10-CM | POA: Diagnosis not present

## 2018-10-23 ENCOUNTER — Encounter (INDEPENDENT_AMBULATORY_CARE_PROVIDER_SITE_OTHER): Payer: Self-pay | Admitting: Orthopaedic Surgery

## 2018-10-23 ENCOUNTER — Ambulatory Visit (INDEPENDENT_AMBULATORY_CARE_PROVIDER_SITE_OTHER): Payer: Federal, State, Local not specified - PPO | Admitting: Orthopaedic Surgery

## 2018-10-23 DIAGNOSIS — M1612 Unilateral primary osteoarthritis, left hip: Secondary | ICD-10-CM

## 2018-10-23 DIAGNOSIS — M1611 Unilateral primary osteoarthritis, right hip: Secondary | ICD-10-CM

## 2018-10-23 NOTE — Progress Notes (Signed)
Office Visit Note   Patient: ANWITA MENCER           Date of Birth: 04/05/1966           MRN: [id number] Visit Date: 10/23/2018              Requested by: Esaw Grandchild, NP [place], [place] [address] PCP: Esaw Grandchild, NP   Assessment & Plan: Visit Diagnoses:  1. Primary osteoarthritis of right hip   2. Primary osteoarthritis of left hip     Plan: Impression is bilateral mild hip osteoarthritis with left hip labral tear.  Her x-rays demonstrate a small cam of her femoral heads.  This was discussed with the patient.  I recommend diet exercise and resumption of her home exercises.  She would like to try bilateral intra-articular hip injections today.  Questions encouraged and answered today.  She will continue to receive trochanteric bursa injections through her pain clinic.  Follow-Up Instructions: Return if symptoms worsen or fail to improve.   Orders:  No orders of the defined types were placed in this encounter.  No orders of the defined types were placed in this encounter.     Procedures: No procedures performed   Clinical Data: No additional findings.   Subjective: Chief Complaint  Patient presents with  . Left Hip - Pain    Derin is a 52 year old female who comes in for bilateral hip pain worse on the left.  This has progressively gotten worse over the last 10 years.  She denies any injuries.  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  She is in chronic pain management clinic with Dr. Crist Fat V for chronic back pain hip pain.  She takes hydrocodone for this.  She recently had an MRI of her hips that shows mild bilateral osteoarthritis as well as a left labral tear.   Review of Systems  Constitutional: Negative.   HENT: Negative.   Eyes: Negative.   Respiratory: Negative.   Cardiovascular: Negative.   Endocrine: Negative.   Musculoskeletal: Negative.   Neurological: Negative.   Hematological: Negative.   Psychiatric/Behavioral:  Negative.   All other systems reviewed and are negative.    Objective: Vital Signs: There were no vitals taken for this visit.  Physical Exam  Constitutional: She is oriented to person, place, and time. She appears well-developed and well-nourished.  HENT:  Head: Normocephalic and atraumatic.  Eyes: EOM are normal.  Neck: Neck supple.  Pulmonary/Chest: Effort normal.  Abdominal: Soft.  Neurological: She is alert and oriented to person, place, and time.  Skin: Skin is warm. Capillary refill takes less than 2 seconds.  Psychiatric: She has a normal mood and affect. Her behavior is normal. Judgment and thought content normal.  Nursing note and vitals reviewed.   Ortho Exam Bilateral hip exam shows painful range of motion positive FADIR worse on the left.  Bilateral trochanteric bursa's are slightly tender.  Negative sciatic tension signs.  Positive logroll on the left side. Specialty Comments:  No specialty comments available.  Imaging: No results found.   PMFS History: Patient Active Problem List   Diagnosis Date Noted  . Dysuria 07/18/2018  . Depression 05/16/2018  . Grief 05/16/2018  . Vitamin D deficiency 11/08/2017  . Screening for cervical cancer 11/08/2017  . Healthcare maintenance 07/05/2017  . Menstrual migraine with status migrainosus, not intractable 07/05/2017  . Back pain of lumbar region with sciatica 07/05/2017  . Varicose veins of lower extremities  with other complications 83/25/4982   Past Medical History:  Diagnosis Date  . Allergy   . Anemia   . Chronic hip pain   . Chronic leg pain    bilateral  . Depression   . Migraine   . PMDD (premenstrual dysphoric disorder)   . Restless leg   . S/P epidural steroid injection    affects legs is seen at pain clinic.    Family History  Problem Relation Age of Onset  . Hypertension Father   . Diabetes Father   . Hyperlipidemia Father   . Alcohol abuse Maternal Grandmother   . Alcohol abuse Maternal  Grandfather   . Heart attack Paternal Grandfather   . Cerebral aneurysm Mother   . Cerebral aneurysm Maternal Aunt   . Migraines Maternal Aunt   . Colon cancer Neg Hx   . Esophageal cancer Neg Hx   . Rectal cancer Neg Hx   . Stomach cancer Neg Hx     Past Surgical History:  Procedure Laterality Date  . CERVICAL CERCLAGE    . CESAREAN SECTION     x 2  . CESAREAN SECTION  1994  . [place]   Social History   Occupational History  . Not on file  Tobacco Use  . Smoking status: Former Research scientist (life sciences)  . Smokeless tobacco: Never Used  . Tobacco comment: tried tobacco a few times as a teen  Substance and Sexual Activity  . Alcohol use: Yes    Alcohol/week: 0.0 standard drinks    Comment: Rare; 2-3 times per year socially  . Drug use: No  . Sexual activity: Yes    Birth control/protection: None    Comment: 1st intercourse 87 yo-5 partners

## 2018-10-23 NOTE — Progress Notes (Signed)
Subjective: She is here with left greater than right hip pain.  MRI scan showed mild bilateral degenerative changes with a left-sided para labral cyst and probable labral tear.  Pain especially when getting in the car.  Objective: Decreased range of motion combined with pain on passive flexion and internal rotation of the left hip.  Minimal pain and good range of motion on the right.  Procedure: Ultrasound-guided left hip intra-articular injection: After sterile prep with Betadine, injected 5 cc 1% lidocaine without epinephrine and 40 mg methylprednisolone using a 22-gauge spinal needle, passing the needle through the iliofemoral ligament into the femoral head/neck junction.  Patient had excellent pain relief during the immediate anesthetic phase.  Future treatment options could include PRP or hyaluronic acid injections if she does not get long-term relief.

## 2018-11-02 ENCOUNTER — Other Ambulatory Visit: Payer: Self-pay | Admitting: Adult Health

## 2018-11-07 DIAGNOSIS — M47816 Spondylosis without myelopathy or radiculopathy, lumbar region: Secondary | ICD-10-CM | POA: Diagnosis not present

## 2018-11-07 DIAGNOSIS — M79606 Pain in leg, unspecified: Secondary | ICD-10-CM | POA: Diagnosis not present

## 2018-11-07 DIAGNOSIS — G894 Chronic pain syndrome: Secondary | ICD-10-CM | POA: Diagnosis not present

## 2018-11-07 DIAGNOSIS — M7060 Trochanteric bursitis, unspecified hip: Secondary | ICD-10-CM | POA: Diagnosis not present

## 2018-12-04 ENCOUNTER — Other Ambulatory Visit: Payer: Self-pay | Admitting: Adult Health

## 2018-12-05 DIAGNOSIS — Z79899 Other long term (current) drug therapy: Secondary | ICD-10-CM | POA: Diagnosis not present

## 2018-12-05 DIAGNOSIS — G894 Chronic pain syndrome: Secondary | ICD-10-CM | POA: Diagnosis not present

## 2018-12-05 DIAGNOSIS — M7060 Trochanteric bursitis, unspecified hip: Secondary | ICD-10-CM | POA: Diagnosis not present

## 2018-12-05 DIAGNOSIS — Z79891 Long term (current) use of opiate analgesic: Secondary | ICD-10-CM | POA: Diagnosis not present

## 2018-12-07 ENCOUNTER — Other Ambulatory Visit: Payer: Self-pay | Admitting: Adult Health

## 2018-12-12 ENCOUNTER — Other Ambulatory Visit: Payer: Self-pay | Admitting: Neurology

## 2018-12-17 NOTE — Progress Notes (Signed)
Subjective:    Patient ID: Christine Frank, female    DOB: 1966/01/26, 53 y.o.   MRN: [id number]  HPI:  Christine Frank presents for f/u on mood She was started on Fluoxetine 20mg  in June 2019 to help with increased depression in response to her husband's suicide. 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 She again declined CBT referral She has recently resumed regular exercise- stationary bike and treadmill for 1 hr 3-4 times/week Recent changes to pain control regime- she reports reduction in daily pain levels, denies sedation issues She denies thoughts of harming herself/others  Patient Care Team    Relationship Specialty Notifications Start End  Mina Marble D, NP PCP - General Family Medicine  12/07/16   Meredith Pel, MD Consulting Physician Orthopedic Surgery  12/07/16   Renie Ora, MD Referring Physician Anesthesiology  12/07/16   Anastasio Auerbach, MD Consulting Physician Gynecology  12/07/16     Patient Active Problem List   Diagnosis Date Noted  . Dysuria 07/18/2018  . Depression 05/16/2018  . Grief 05/16/2018  . Vitamin D deficiency 11/08/2017  . Screening for cervical cancer 11/08/2017  . Healthcare maintenance 07/05/2017  . Menstrual migraine with status migrainosus, not intractable 07/05/2017  . Back pain of lumbar region with sciatica 07/05/2017  . Varicose veins of lower extremities with other complications 53/66/4403     Past Medical History:  Diagnosis Date  . Allergy   . Anemia   . Chronic hip pain   . Chronic leg pain    bilateral  . Depression   . Migraine   . PMDD (premenstrual dysphoric disorder)   . Restless leg   . S/P epidural steroid injection    affects legs is seen at pain clinic.     Past Surgical  History:  Procedure Laterality Date  . CERVICAL CERCLAGE    . CESAREAN SECTION     x 2  . CESAREAN SECTION  1994  . CESAREAN SECTION  1999     Family History  Problem Relation Age of Onset  . Hypertension Father   . Diabetes Father   . Hyperlipidemia Father   . Alcohol abuse Maternal Grandmother   . Alcohol abuse Maternal Grandfather   . Heart attack Paternal Grandfather   . Cerebral aneurysm Mother   . Cerebral aneurysm Maternal Aunt   . Migraines Maternal Aunt   . Colon cancer Neg Hx   . Esophageal cancer Neg Hx   . Rectal cancer Neg Hx   . Stomach cancer Neg Hx      Social History   Substance and Sexual Activity  Drug Use No     Social History   Substance and Sexual Activity  Alcohol Use Yes  . Alcohol/week: 0.0 standard drinks   Comment: Rare; 2-3 times per year socially     Social History   Tobacco Use  Smoking Status Former Smoker  Smokeless Tobacco Never Used  Tobacco Comment   tried tobacco a few times as a teen     Outpatient Encounter Medications as of 12/19/2018  Medication Sig Note  . baclofen (LIORESAL) 10 MG tablet Take 1 tablet (10 mg total) by mouth 2 (two) times daily. Take 1/2 to 1 tablet 2 x day if needed for  muscle spasm   . butalbital-aspirin-caffeine (FIORINAL) 50-325-40 MG capsule TAKE 1 CAPSULE BY MOUTH EVERY 6 HOURS AS NEEDED FOR HEADACHE   . cetirizine (ZYRTEC) 10 MG tablet TAKE 1 TABLET DAILY FOR ALLERGIES   . diclofenac sodium (VOLTAREN) 1 % GEL Apply 1 application topically 3 (three) times daily as needed.   . docusate sodium (COLACE) 100 MG capsule Take 100 mg by mouth 2 (two) times daily.   . DULoxetine (CYMBALTA) 60 MG capsule TAKE 1 CAPSULE BY MOUTH EVERY DAY   . EMBEDA 50-2 MG CPCR Take 1 capsule by mouth daily. 12/07/2016: Received from: External Pharmacy  . FLUoxetine (PROZAC) 20 MG capsule TAKE 1 CAPSULE BY MOUTH EVERY DAY   . fluticasone (FLONASE) 50 MCG/ACT nasal spray INHALE 1-2 SPRAYS IN EACH NOSTRIL TWICE DAILY    . HYDROcodone-acetaminophen (NORCO) 10-325 MG tablet Take 1 tablet by mouth 3 (three) times daily. 12/07/2016: Received from: External Pharmacy  . ipratropium (ATROVENT) 0.03 % nasal spray PLACE 2 SPRAYS INTO THE NOSE 3 (THREE) TIMES DAILY.   Marland Kitchen loperamide (IMODIUM) 2 MG capsule Take 1 capsule (2 mg total) by mouth 4 (four) times daily as needed for diarrhea or loose stools.   . magnesium gluconate (MAGONATE) 500 MG tablet Take 500 mg by mouth 2 (two) times daily.   . Multiple Vitamin (MULTIVITAMIN) capsule Take 1 capsule by mouth daily.   . ondansetron (ZOFRAN ODT) 4 MG disintegrating tablet Take 1 tablet (4 mg total) by mouth every 8 (eight) hours as needed for nausea or vomiting.   . phenazopyridine (PYRIDIUM) 200 MG tablet Take 1 tablet (200 mg total) by mouth 3 (three) times daily with meals. Take for two days   . promethazine (PHENERGAN) 25 MG tablet Take 1 tablet (25 mg total) by mouth every 6 (six) hours as needed for nausea or vomiting.   . rizatriptan (MAXALT) 10 MG tablet Take 1 tablet (10 mg total) by mouth as needed for migraine. May repeat in 2 hours if needed. Max twice in a day. Must be seen for further refills or have primary care refill.   . [DISCONTINUED] DULoxetine (CYMBALTA) 60 MG capsule TAKE 1 CAPSULE BY MOUTH EVERY DAY   . [DISCONTINUED] FLUoxetine (PROZAC) 20 MG capsule TAKE 1 CAPSULE BY MOUTH EVERY DAY   . morphine (MS CONTIN) 30 MG 12 hr tablet Take 1 tablet by mouth 2 (two) times daily.   . [DISCONTINUED] rizatriptan (MAXALT) 10 MG tablet Take 1 tablet (10 mg total) by mouth as needed for migraine. May repeat in 2 hours if needed. Max twice in a day.    Facility-Administered Encounter Medications as of 12/19/2018  Medication  . 0.9 %  sodium chloride infusion    Allergies: Sulfa antibiotics  Body mass index is 32.78 kg/m.  Blood pressure 134/87, pulse (!) 105, temperature 98.2 F (36.8 C), temperature source Oral, height 5\' 5"  (1.651 m), weight 197 lb (89.4 kg),  SpO2 99 %.  Review of Systems  Constitutional: Positive for fatigue. Negative for activity change, appetite change, chills, diaphoresis, fever and unexpected weight change.  HENT: Negative for congestion.   Eyes: Negative for visual disturbance.  Respiratory: Negative for cough, chest tightness, shortness of breath, wheezing and stridor.   Cardiovascular: Negative for chest pain, palpitations and leg swelling.  Gastrointestinal: Negative for abdominal distention, anal bleeding, blood in stool, constipation, diarrhea, nausea and vomiting.  Endocrine: Negative for cold intolerance, heat intolerance, polydipsia, polyphagia and polyuria.  Genitourinary: Negative for difficulty urinating and flank pain.  Musculoskeletal: Positive for arthralgias, back pain, gait problem, joint swelling, myalgias, neck pain and neck stiffness.  Skin: Negative for color change, pallor, rash and wound.  Neurological: Negative for dizziness and light-headedness.  Hematological: Does not bruise/bleed easily.  Psychiatric/Behavioral: Positive for dysphoric mood. Negative for agitation, behavioral problems, confusion, decreased concentration, hallucinations, self-injury, sleep disturbance and suicidal ideas. The patient is not nervous/anxious and is not hyperactive.        Objective:   Physical Exam Vitals signs and nursing note reviewed.  Constitutional:      General: She is not in acute distress.    Appearance: She is not ill-appearing, toxic-appearing or diaphoretic.  HENT:     Head: Normocephalic and atraumatic.     Right Ear: Tympanic membrane, ear canal and external ear normal.     Left Ear: Tympanic membrane, ear canal and external ear normal.     Nose: Nose normal. No congestion or rhinorrhea.  Cardiovascular:     Rate and Rhythm: Tachycardia present.     Pulses: Normal pulses.     Heart sounds: Normal heart sounds. No murmur. No friction rub. No gallop.   Pulmonary:     Effort: Pulmonary effort is  normal. No respiratory distress.     Breath sounds: Normal breath sounds. No stridor. No wheezing, rhonchi or rales.  Chest:     Chest wall: No tenderness.  Skin:    Capillary Refill: Capillary refill takes less than 2 seconds.  Neurological:     Mental Status: She is alert and oriented to person, place, and time.  Psychiatric:        Mood and Affect: Mood normal.        Behavior: Behavior normal.        Thought Content: Thought content normal.        Judgment: Judgment normal.       Assessment & Plan:   1. Dysuria     Dysuria Referral to Urology placed, your urinalysis was normal today despite your continued symptoms.   Healthcare maintenance Please follow-up in 6 months for complete physical with fasting labs.  Depression If you ever notice any signs or symptoms of serotonin syndrome, seek immediate medical help. Continue all medications as directed. Continue to drink plenty of water. Follow heart healthy diet. Continue to exercise on regular basis- GREAT JOB! If you would like referral to [facility], please call and referral will be placed.   FOLLOW-UP:  Return in about 6 months (around 06/19/2019) for CPE, Fasting Labs.

## 2018-12-19 ENCOUNTER — Ambulatory Visit (INDEPENDENT_AMBULATORY_CARE_PROVIDER_SITE_OTHER): Payer: Federal, State, Local not specified - PPO | Admitting: Adult Health

## 2018-12-19 ENCOUNTER — Encounter: Payer: Self-pay | Admitting: Adult Health

## 2018-12-19 ENCOUNTER — Other Ambulatory Visit: Payer: Self-pay | Admitting: *Deleted

## 2018-12-19 VITALS — BP 134/87 | HR 105 | Temp 98.2°F | Ht 65.0 in | Wt 197.0 lb

## 2018-12-19 DIAGNOSIS — E559 Vitamin D deficiency, unspecified: Secondary | ICD-10-CM | POA: Diagnosis not present

## 2018-12-19 DIAGNOSIS — R300 Dysuria: Secondary | ICD-10-CM | POA: Diagnosis not present

## 2018-12-19 DIAGNOSIS — Z0000 Encounter for general adult medical examination without abnormal findings: Secondary | ICD-10-CM | POA: Diagnosis not present

## 2018-12-19 LAB — POCT URINALYSIS DIPSTICK
BILIRUBIN UA: NEGATIVE
Blood, UA: NEGATIVE
GLUCOSE UA: NEGATIVE
KETONES UA: NEGATIVE
Leukocytes, UA: NEGATIVE
Nitrite, UA: NEGATIVE
Protein, UA: NEGATIVE
Spec Grav, UA: 1.02 (ref 1.010–1.025)
UROBILINOGEN UA: 0.2 U/dL
pH, UA: 6 (ref 5.0–8.0)

## 2018-12-19 MED ORDER — FLUOXETINE HCL 20 MG PO CAPS: ORAL_CAPSULE | ORAL | 3 refills | Status: DC

## 2018-12-19 MED ORDER — DULOXETINE HCL 60 MG PO CPEP: ORAL_CAPSULE | ORAL | 3 refills | Status: DC

## 2018-12-19 MED ORDER — RIZATRIPTAN BENZOATE 10 MG PO TABS: 10.0000 mg | ORAL_TABLET | ORAL | 0 refills | Status: DC | PRN

## 2018-12-19 NOTE — Assessment & Plan Note (Signed)
Referral to Urology placed, your urinalysis was normal today despite your continued symptoms.

## 2018-12-19 NOTE — Telephone Encounter (Signed)
Received refill request for Rizatriptan. One month given with note that pt needs f/u appt or primary care can refill.

## 2018-12-19 NOTE — Patient Instructions (Signed)
Persistent Depressive Disorder  Persistent depressive disorder (PDD) is a mental health condition. PDD causes symptoms of low-level depression for 2 years or longer. It may also be called long-term (chronic) depression or dysthymia. PDD may include episodes of more severe depression that last for about 2 weeks (major depressive disorder or MDD). PDD can affect the way you think, feel, and sleep. This condition may also affect your relationships. You may be more likely to get sick if you have PDD. Symptoms of PDD occur for most of the day and may include:  Feeling tired (fatigue).  Low energy.  Eating too much or too little.  Sleeping too much or too little.  Feeling restless or agitated.  Feeling hopeless.  Feeling worthless or guilty.  Feeling worried or nervous (anxiety).  Trouble concentrating or making decisions.  Low self-esteem.  A negative way of looking at things (outlook).  Not being able to have fun or feel pleasure.  Avoiding interacting with people.  Getting angry or annoyed easily (irritability).  Acting aggressive or angry. Follow these instructions at home: Activity  Go back to your normal activities as told by your doctor.  Exercise regularly as told by your doctor. General instructions  Take over-the-counter and prescription medicines only as told by your doctor.  Do not drink alcohol. Or, limit how much alcohol you drink to no more than 1 drink a day for nonpregnant women and 2 drinks a day for men. One drink equals 12 oz of beer, 5 oz of wine, or 1 oz of hard liquor. Alcohol can affect any antidepressant medicines you are taking. Talk with your doctor about your alcohol use.  Eat a healthy diet and get plenty of sleep.  Find activities that you enjoy each day.  Consider joining a support group. Your doctor may be able to suggest a support group.  Keep all follow-up visits as told by your doctor. This is important. Where to find more  information [employer] on Mental Illness  www.nami.org [place] National Institute of Mental Health  https://carter.com/ National Suicide Prevention Lifeline  (334)413-8695).  This is free, 24-hour help. Contact a doctor if:  Your symptoms get worse.  You have new symptoms.  You have trouble sleeping or doing your daily activities. Get help right away if:  You self-harm.  You have serious thoughts about hurting yourself or others.  You see, hear, taste, smell, or feel things that are not there (hallucinate). This information is not intended to replace advice given to you by your health care provider. Make sure you discuss any questions you have with your health care provider. Document Released: 10/25/2015 Document Revised: 07/07/2016 Document Reviewed: 07/07/2016 Elsevier Interactive Patient Education  2019 [employer].   Serotonin Syndrome Serotonin is a chemical in your body (neurotransmitter) that helps to control several functions, such as:  Brain and nerve cell function.  Mood and emotions.  Memory.  Eating.  Sleeping.  Sexual activity.  Stress response. Having too much serotonin in your body can cause serotonin syndrome. This condition can be harmful to your brain and nerve cells. This can be a life-threatening condition. What are the causes? This condition may be caused by taking medicines or drugs that increase the level of serotonin in your body, such as:  Antidepressant medicines.  Migraine medicines.  Certain pain medicines.  Certain drugs, including ecstasy, LSD, cocaine, and amphetamines.  Over-the-counter cough or cold medicines that contain dextromethorphan.  Certain herbal supplements, including St. John's wort, ginseng, and nutmeg. This  condition usually occurs when you take these medicines or drugs in combination, but it can also happen with a high dose of a single medicine or drug. What increases the risk? You are more likely to  develop this condition if:  You just started taking a medicine or drug that increases the level of serotonin in the body.  You recently increased the dose of a medicine or drug that increases the level of serotonin in the body.  You take more than one medicine or drug that increases the level of serotonin in the body. What are the signs or symptoms? Symptoms of this condition usually start within several hours of taking a medicine or drug. Symptoms may be mild or severe. Mild symptoms include:  Sweating.  Restlessness or agitation.  Muscle twitching or stiffness.  Rapid heart rate.  Nausea and vomiting.  Diarrhea.  Headache.  Shivering or goose bumps.  Confusion. Severe symptoms include:  Irregular heartbeat.  Seizures.  Loss of consciousness.  High fever. How is this diagnosed? This condition may be diagnosed based on:  Your medical history.  A physical exam.  Your prior use of drugs and medicines.  Blood or urine tests. These may be used to rule out other causes of your symptoms. How is this treated? The treatment for this condition depends on the severity of your symptoms.  For mild cases, stopping the medicine or drug that caused your condition is usually all that is needed.  For moderate to severe cases, treatment in a hospital may be needed to prevent or manage life-threatening symptoms. This may include medicines to control your symptoms, IV fluids, interventions to support your breathing, and treatments to control your body temperature. Follow these instructions at home: Medicines   Take over-the-counter and prescription medicines only as told by your health care provider. This is important.  Check with your health care provider before you start taking any new prescriptions, over-the-counter medicines, herbs, or supplements.  Avoid combining any medicines that can cause this condition to occur. Lifestyle   Maintain a healthy lifestyle. ? Eat a  healthy diet that includes plenty of vegetables, fruits, whole grains, low-fat dairy products, and lean protein. Do not eat a lot of foods that are high in fat, added sugars, or salt. ? Get the right amount and quality of sleep. Most adults need 7-9 hours of sleep each night. ? Make time to exercise, even if it is only for short periods of time. Most adults should exercise for at least 150 minutes each week. ? Do not drink alcohol. ? Do not use illegal drugs, and do not take medicines for reasons other than they are prescribed. General instructions  Do not use any products that contain nicotine or tobacco, such as cigarettes and e-cigarettes. If you need help quitting, ask your health care provider.  Keep all follow-up visits as told by your health care provider. This is important. Contact a health care provider if:  Your symptoms do not improve or they get worse. Get help right away if you:  Have worsening confusion, severe headache, chest pain, high fever, seizures, or loss of consciousness.  Experience serious side effects of medicine, such as swelling of your face, lips, tongue, or throat.  Have serious thoughts about hurting yourself or others. These symptoms may represent a serious problem that is an emergency. Do not wait to see if the symptoms will go away. Get medical help right away. Call your local emergency services (911 in the [place] Do  not drive yourself to the hospital. If you ever feel like you may hurt yourself or others, or have thoughts about taking your own life, get help right away. You can go to your nearest emergency department or call:  Your local emergency services (911 in the [place]  A suicide crisis helpline, such as the [place] at 850 109 5234. This is open 24 hours a day. Summary  Serotonin is a brain chemical that helps to regulate the nervous system. High levels of serotonin in the body can cause serotonin syndrome, which is a  very dangerous condition.  This condition may be caused by taking medicines or drugs that increase the level of serotonin in your body.  Treatment depends on the severity of your symptoms. For mild cases, stopping the medicine or drug that caused your condition is usually all that is needed.  Check with your health care provider before you start taking any new prescriptions, over-the-counter medicines, herbs, or supplements. This information is not intended to replace advice given to you by your health care provider. Make sure you discuss any questions you have with your health care provider. Document Released: 12/21/2004 Document Revised: 12/21/2017 Document Reviewed: 12/21/2017 Elsevier Interactive Patient Education  [employer].  If you ever notice any signs or symptoms of serotonin syndrome, seek immediate medical help. Continue all medications as directed. Continue to drink plenty of water. Follow heart healthy diet. Continue to exercise on regular basis- GREAT JOB! If you would like referral to [facility], please call and referral will be placed. Referral to Urology placed, your urinalysis was normal today despite your continued symptoms. Please follow-up in 6 months for complete physical with fasting labs. GREAT TO SEE YOU!

## 2018-12-19 NOTE — Assessment & Plan Note (Signed)
Please follow-up in 6 months for complete physical with fasting labs.

## 2018-12-19 NOTE — Assessment & Plan Note (Signed)
If you ever notice any signs or symptoms of serotonin syndrome, seek immediate medical help. Continue all medications as directed. Continue to drink plenty of water. Follow heart healthy diet. Continue to exercise on regular basis- GREAT JOB! If you would like referral to [facility], please call and referral will be placed.

## 2019-01-09 DIAGNOSIS — M7060 Trochanteric bursitis, unspecified hip: Secondary | ICD-10-CM | POA: Diagnosis not present

## 2019-01-11 ENCOUNTER — Other Ambulatory Visit: Payer: Self-pay | Admitting: Neurology

## 2019-01-15 ENCOUNTER — Other Ambulatory Visit: Payer: Self-pay | Admitting: Adult Health

## 2019-01-15 DIAGNOSIS — Z1231 Encounter for screening mammogram for malignant neoplasm of breast: Secondary | ICD-10-CM

## 2019-01-21 ENCOUNTER — Other Ambulatory Visit: Payer: Self-pay | Admitting: Neurology

## 2019-02-06 DIAGNOSIS — M7060 Trochanteric bursitis, unspecified hip: Secondary | ICD-10-CM | POA: Diagnosis not present

## 2019-02-06 DIAGNOSIS — G894 Chronic pain syndrome: Secondary | ICD-10-CM | POA: Diagnosis not present

## 2019-02-06 DIAGNOSIS — G2581 Restless legs syndrome: Secondary | ICD-10-CM | POA: Diagnosis not present

## 2019-02-06 DIAGNOSIS — M47816 Spondylosis without myelopathy or radiculopathy, lumbar region: Secondary | ICD-10-CM | POA: Diagnosis not present

## 2019-02-11 ENCOUNTER — Ambulatory Visit: Payer: Federal, State, Local not specified - PPO

## 2019-02-14 IMAGING — DX DG HIP (WITH OR WITHOUT PELVIS) 3-4V BILAT
3 series · 3 of 3 positions shown · non-contrast
Comparison: None.

CLINICAL DATA: Bilateral hip pain

EXAM:
DG HIP (WITH OR WITHOUT PELVIS) 3-4V BILAT

[pelvis ap]
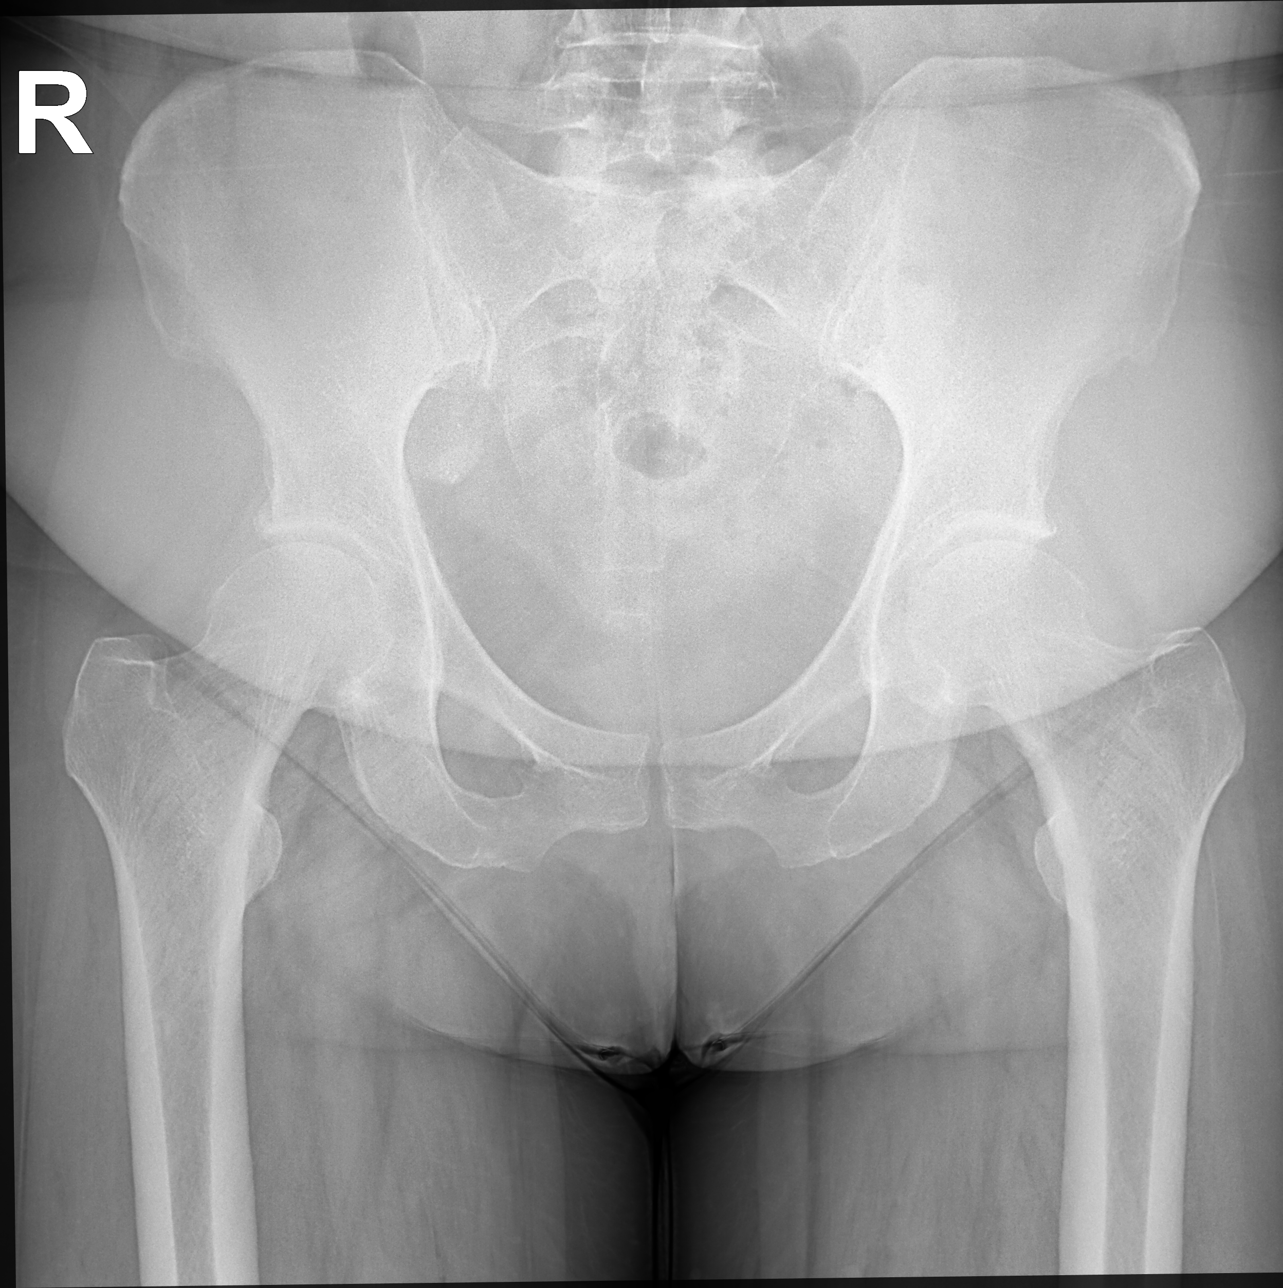

[hip joint (frog view) (1 of 2)]
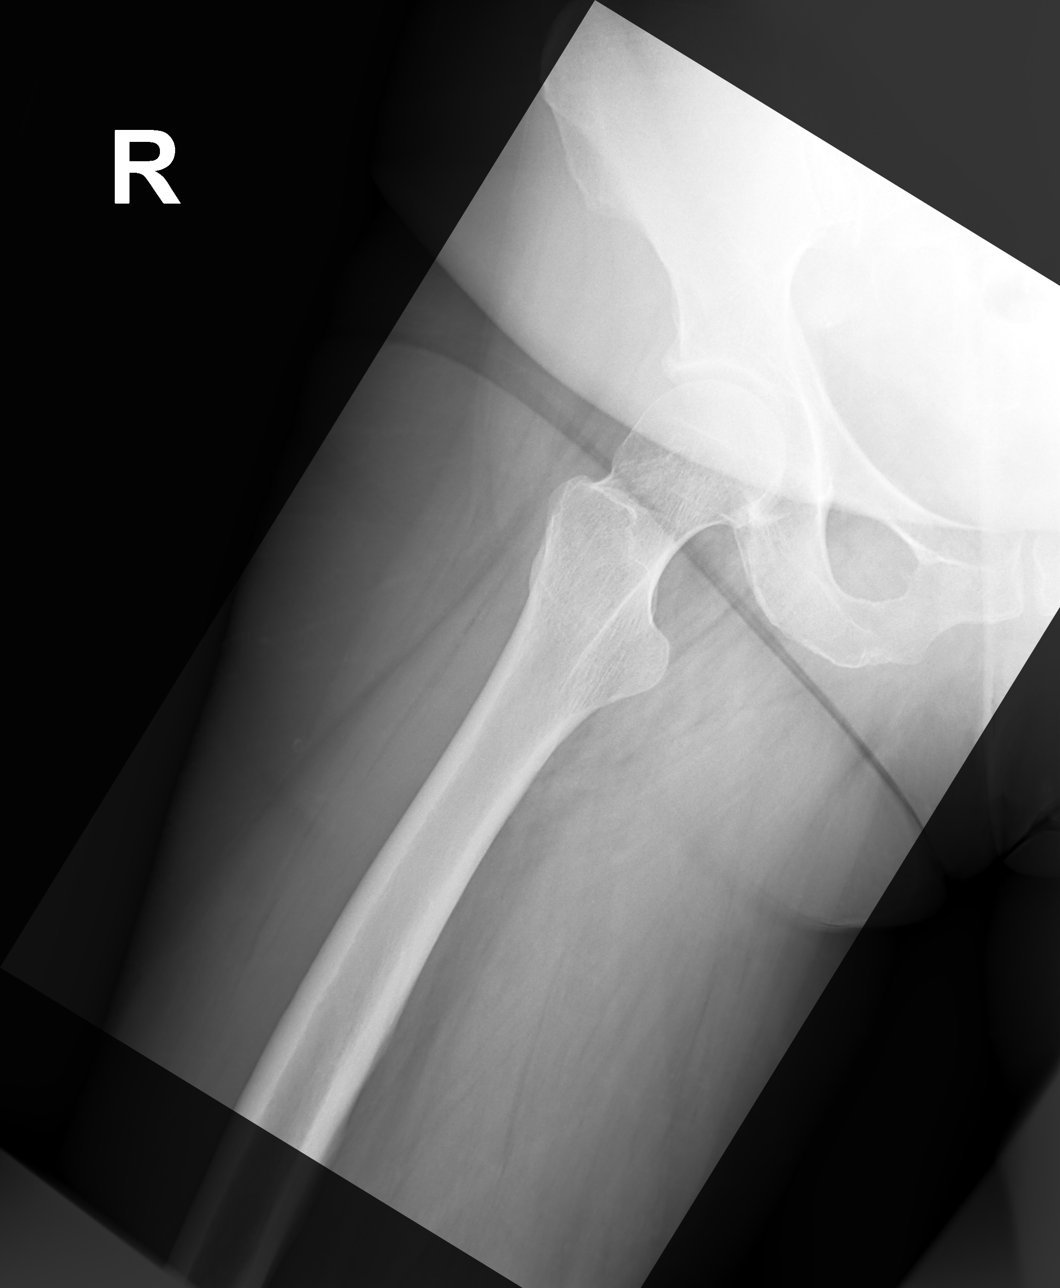

[hip joint (frog view) (2 of 2)]
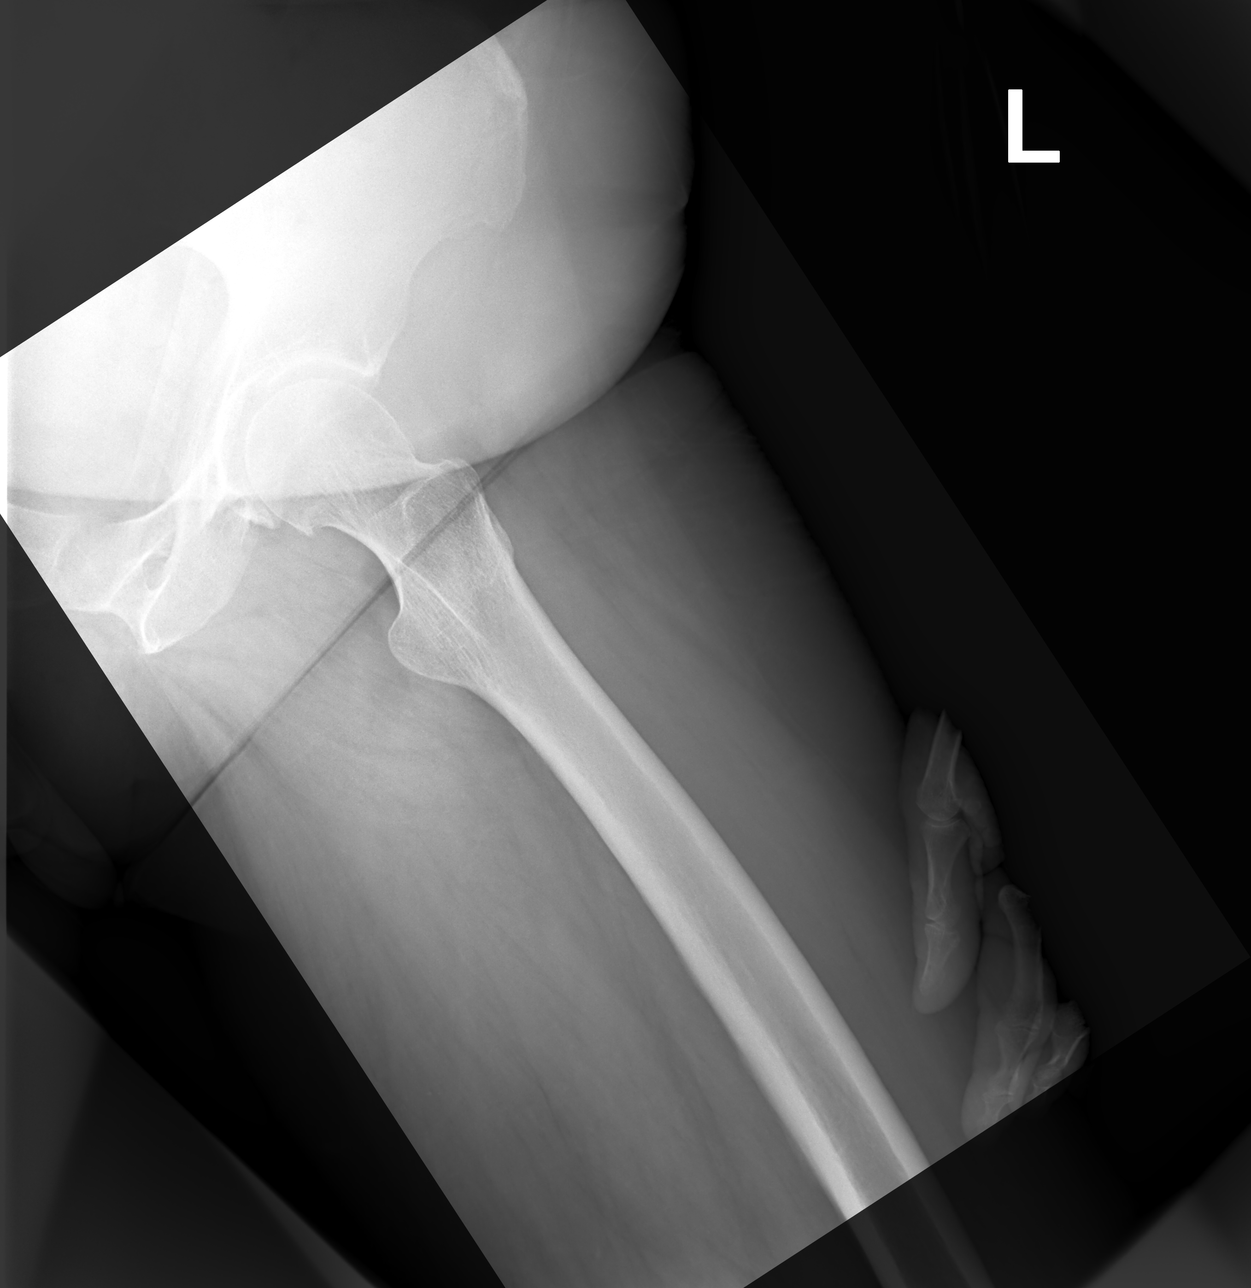

[3 of 3 positions shown; findings below may reference images not displayed]

FINDINGS: Mild degenerative changes in the hips without loss of joint space.
No fractures or bony lesions. No other acute abnormalities.
IMPRESSION: Mild degenerative changes.  No other acute abnormalities.

## 2019-02-17 ENCOUNTER — Other Ambulatory Visit: Payer: Self-pay | Admitting: Neurology

## 2019-02-17 ENCOUNTER — Other Ambulatory Visit: Payer: Self-pay | Admitting: Adult Health

## 2019-02-20 DIAGNOSIS — M7060 Trochanteric bursitis, unspecified hip: Secondary | ICD-10-CM | POA: Diagnosis not present

## 2019-02-20 DIAGNOSIS — G894 Chronic pain syndrome: Secondary | ICD-10-CM | POA: Diagnosis not present

## 2019-02-20 DIAGNOSIS — M47816 Spondylosis without myelopathy or radiculopathy, lumbar region: Secondary | ICD-10-CM | POA: Diagnosis not present

## 2019-02-20 DIAGNOSIS — G2581 Restless legs syndrome: Secondary | ICD-10-CM | POA: Diagnosis not present

## 2019-02-20 DIAGNOSIS — M25569 Pain in unspecified knee: Secondary | ICD-10-CM | POA: Diagnosis not present

## 2019-03-04 ENCOUNTER — Ambulatory Visit: Payer: Federal, State, Local not specified - PPO

## 2019-03-11 DIAGNOSIS — Z79891 Long term (current) use of opiate analgesic: Secondary | ICD-10-CM | POA: Diagnosis not present

## 2019-03-11 DIAGNOSIS — G894 Chronic pain syndrome: Secondary | ICD-10-CM | POA: Diagnosis not present

## 2019-03-11 DIAGNOSIS — M47816 Spondylosis without myelopathy or radiculopathy, lumbar region: Secondary | ICD-10-CM | POA: Diagnosis not present

## 2019-03-11 DIAGNOSIS — M7060 Trochanteric bursitis, unspecified hip: Secondary | ICD-10-CM | POA: Diagnosis not present

## 2019-03-11 DIAGNOSIS — Z79899 Other long term (current) drug therapy: Secondary | ICD-10-CM | POA: Diagnosis not present

## 2019-03-11 DIAGNOSIS — M79606 Pain in leg, unspecified: Secondary | ICD-10-CM | POA: Diagnosis not present

## 2019-03-14 ENCOUNTER — Other Ambulatory Visit: Payer: Self-pay | Admitting: Neurology

## 2019-03-17 ENCOUNTER — Telehealth: Payer: Federal, State, Local not specified - PPO | Admitting: Family

## 2019-03-17 DIAGNOSIS — B373 Candidiasis of vulva and vagina: Secondary | ICD-10-CM

## 2019-03-17 DIAGNOSIS — B3731 Acute candidiasis of vulva and vagina: Secondary | ICD-10-CM

## 2019-03-17 MED ORDER — FLUCONAZOLE 150 MG PO TABS: 150.0000 mg | ORAL_TABLET | ORAL | 0 refills | Status: DC | PRN

## 2019-03-17 NOTE — Progress Notes (Signed)
We are sorry that you are not feeling well. Here is how we plan to help! Based on what you shared with me it looks like you: May have a yeast vaginosis.  Approximately 5 minutes was spent documenting and reviewing patient's chart.    Vaginosis is an inflammation of the vagina that can result in discharge, itching and pain. The cause is usually a change in the normal balance of vaginal bacteria or an infection. Vaginosis can also result from reduced estrogen levels after menopause.  The most common causes of vaginosis are:   Bacterial vaginosis which results from an overgrowth of one on several organisms that are normally present in your vagina.   Yeast infections which are caused by a naturally occurring fungus called candida.   Vaginal atrophy (atrophic vaginosis) which results from the thinning of the vagina from reduced estrogen levels after menopause.   Trichomoniasis which is caused by a parasite and is commonly transmitted by sexual intercourse.  Factors that increase your risk of developing vaginosis include: . Medications, such as antibiotics and steroids . Uncontrolled diabetes . Use of hygiene products such as bubble bath, vaginal spray or vaginal deodorant . Douching . Wearing damp or tight-fitting clothing . Using an intrauterine device (IUD) for birth control . Hormonal changes, such as those associated with pregnancy, birth control pills or menopause . Sexual activity . Having a sexually transmitted infection  Your treatment plan is A single Diflucan (fluconazole) 150mg tablet once.  I have electronically sent this prescription into the pharmacy that you have chosen.  Be sure to take all of the medication as directed. Stop taking any medication if you develop a rash, tongue swelling or shortness of breath. Mothers who are breast feeding should consider pumping and discarding their breast milk while on these antibiotics. However, there is no consensus that infant exposure  at these doses would be harmful.  Remember that medication creams can weaken latex condoms. .   HOME CARE:  Good hygiene may prevent some types of vaginosis from recurring and may relieve some symptoms:  . Avoid baths, hot tubs and whirlpool spas. Rinse soap from your outer genital area after a shower, and dry the area well to prevent irritation. Don't use scented or harsh soaps, such as those with deodorant or antibacterial action. . Avoid irritants. These include scented tampons and pads. . Wipe from front to back after using the toilet. Doing so avoids spreading fecal bacteria to your vagina.  Other things that may help prevent vaginosis include:  . Don't douche. Your vagina doesn't require cleansing other than normal bathing. Repetitive douching disrupts the normal organisms that reside in the vagina and can actually increase your risk of vaginal infection. Douching won't clear up a vaginal infection. . Use a latex condom. Both female and female latex condoms may help you avoid infections spread by sexual contact. . Wear cotton underwear. Also wear pantyhose with a cotton crotch. If you feel comfortable without it, skip wearing underwear to bed. Yeast thrives in moist environments Your symptoms should improve in the next day or two.  GET HELP RIGHT AWAY IF:  . You have pain in your lower abdomen ( pelvic area or over your ovaries) . You develop nausea or vomiting . You develop a fever . Your discharge changes or worsens . You have persistent pain with intercourse . You develop shortness of breath, a rapid pulse, or you faint.  These symptoms could be signs of problems or infections that need to   be evaluated by a medical provider now.  MAKE SURE YOU    Understand these instructions.  Will watch your condition.  Will get help right away if you are not doing well or get worse.  Your e-visit answers were reviewed by a board certified advanced clinical practitioner to complete  your personal care plan. Depending upon the condition, your plan could have included both over the counter or prescription medications. Please review your pharmacy choice to make sure that you have choses a pharmacy that is open for you to pick up any needed prescription, Your safety is important to us. If you have drug allergies check your prescription carefully.   You can use MyChart to ask questions about today's visit, request a non-urgent call back, or ask for a work or school excuse for 24 hours related to this e-Visit. If it has been greater than 24 hours you will need to follow up with your provider, or enter a new e-Visit to address those concerns. You will get a MyChart message within the next two days asking about your experience. I hope that your e-visit has been valuable and will speed your recovery.  

## 2019-03-24 ENCOUNTER — Telehealth: Payer: Self-pay | Admitting: Neurology

## 2019-03-24 NOTE — Telephone Encounter (Signed)
Pt called and consented to a Virtual Visit and for the insurance to be billed as such. Email in chart was confirmed and pt was informed if she has a copay it will be billed to her.

## 2019-03-24 NOTE — Telephone Encounter (Signed)
Apt converted to VV due to COVID-19.   number: [phone] Password: [email or web address] Host key: [id number]. DW

## 2019-03-25 ENCOUNTER — Encounter: Payer: Self-pay | Admitting: Neurology

## 2019-03-25 NOTE — Telephone Encounter (Signed)
Spoke with pt to update chart before vv tomorrow AM. Confirmed pt using 2 identifiers. Updated chart including history, meds, allergies, etc. She stated that she never did get her MRI. Her husband passed around that time. She would like to know if she still needs.I advised I would send a message to Dr. Jaynee Eagles but likely this will be discussed in the vv tomorrow. She verbalized appreciation.

## 2019-03-26 ENCOUNTER — Other Ambulatory Visit: Payer: Self-pay

## 2019-03-26 ENCOUNTER — Telehealth: Payer: Self-pay | Admitting: Neurology

## 2019-03-26 ENCOUNTER — Ambulatory Visit (INDEPENDENT_AMBULATORY_CARE_PROVIDER_SITE_OTHER): Payer: Federal, State, Local not specified - PPO | Admitting: Neurology

## 2019-03-26 DIAGNOSIS — G441 Vascular headache, not elsewhere classified: Secondary | ICD-10-CM | POA: Diagnosis not present

## 2019-03-26 DIAGNOSIS — H93A9 Pulsatile tinnitus, unspecified ear: Secondary | ICD-10-CM | POA: Diagnosis not present

## 2019-03-26 DIAGNOSIS — H547 Unspecified visual loss: Secondary | ICD-10-CM

## 2019-03-26 DIAGNOSIS — R51 Headache with orthostatic component, not elsewhere classified: Secondary | ICD-10-CM

## 2019-03-26 DIAGNOSIS — G43709 Chronic migraine without aura, not intractable, without status migrainosus: Secondary | ICD-10-CM | POA: Diagnosis not present

## 2019-03-26 DIAGNOSIS — G4484 Primary exertional headache: Secondary | ICD-10-CM

## 2019-03-26 DIAGNOSIS — R519 Headache, unspecified: Secondary | ICD-10-CM

## 2019-03-26 MED ORDER — BUTALBITAL-ASPIRIN-CAFFEINE 50-325-40 MG PO CAPS: ORAL_CAPSULE | ORAL | 3 refills | Status: DC

## 2019-03-26 MED ORDER — GABAPENTIN 300 MG PO CAPS: 300.0000 mg | ORAL_CAPSULE | Freq: Three times a day (TID) | ORAL | 11 refills | Status: DC

## 2019-03-26 MED ORDER — PROMETHAZINE HCL 25 MG PO TABS: 25.0000 mg | ORAL_TABLET | Freq: Four times a day (QID) | ORAL | 11 refills | Status: DC | PRN

## 2019-03-26 MED ORDER — RIZATRIPTAN BENZOATE 10 MG PO TABS: ORAL_TABLET | ORAL | 0 refills | Status: DC

## 2019-03-26 NOTE — Telephone Encounter (Signed)
[employer] order sent to [facility] they will reach out to the pt to schedule.

## 2019-03-26 NOTE — Progress Notes (Signed)
[facility]    Provider:  Dr Jaynee Eagles Referring Provider: Esaw Grandchild, NP Primary Care Physician:  Esaw Grandchild, NP  CC:  Migraines  Interval history 03/26/2019: Follow up for migraines  Virtual Visit via Video Note  I connected with Christine Frank on 03/26/19 at  8:30 AM EDT by a video enabled telemedicine application and verified that I am speaking with the correct person using two identifiers.   I discussed the limitations of evaluation and management by telemedicine and the availability of in person appointments. The patient expressed understanding and agreed to proceed.  She had a hard year, her husband passed away. Her headaches are the same, the more he gets into menopause the more variable. The triptan works. She has not had nausea. The rizatriptan works, sometimes it will come back later in the day so advised to take with alleve. She rarely takes the Fiorinal. It is less evident if she is having a migraine, it may start out at as a headache now and may or may not progress to a migraine. She may try tylenol and if it doesn't work she takes a fiorinal. She may take 8 Rizatriptans sometimes more. She takes 1/3 of the prescription of the bottle. Birth control may help stabilize  I suggested we start a preventative. She would like to keep a headache journal. She is waking in the middle of the night with headaches and in the morning. The headaches are positional and exertional. Monring headaches. Worsening vision loss. Her husband dies so she did not get imaging, worsening pulsatule tinnitus. Needs to get imaging completed due to progression of symptoms.  HPI:  Christine Frank is a 53 y.o. female here as a referral from Dr. Loleta Books for migraines. Started at the age of 12-13 around the time she started her period. Migraines improved with birth control and having kids. Migraines became severe again in her 75s. Had significant nausea and vomiting. Migraines  worse around her menses. She would get one at the start of menses, at the end of menstrual cycle and maybe one somewhere in between. She started having migraines with hot flashes with menopause. Her mother had 2 aneurysms and so did her aunt. She has 25 headache free days. Maybe 1-2 migraine days a month. Her migraines are unilateral, like her head is in a vice, light and sound sensitivity, nausea and vomiting. She wakes with them, worse bending over, positional. She has vision loss in both eyes peripheral vision loss. Hot flash triggers. Aunt has migraines. She has neck pain as well with the headaches. She has pulsatile tinnitus as well. No other focal neurologic deficits, associated symptoms, inciting events or modifiable factors.  Reviewed notes, labs and imaging from outside physicians, which showed:  Personally reviewed images and agree with the following: Findings: No plain film evidence of cervical spine fracture or malalignment.  On the swimmer's view, there is minimal irregularity of the anterior superior aspect of the T8 vertebral body.  This may be projectional origin.  If there is any suspicion of thoracic spine injury this will require further investigation.   IMPRESSION: No plain film evidence of cervical spine fracture or malalignment  Tsh/cbc/bmp unremarkable  Review of Systems: Patient complains of symptoms per HPI as well as the following symptoms: headache, vision loss, neck pain, migraine. Pertinent negatives and positives per HPI. All others negative.   Social History   Socioeconomic History  . Marital status: Widowed    Spouse name: Not on  file  . Number of children: 2  . Years of education: Not on file  . Highest education level: Bachelor's degree (e.g., BA, AB, BS)  Occupational History  . Not on file  Social Needs  . Financial resource strain: Not on file  . Food insecurity:    Worry: Not on file    Inability: Not on file  . Transportation needs:     Medical: Not on file    Non-medical: Not on file  Tobacco Use  . Smoking status: Never Smoker  . Smokeless tobacco: Never Used  . Tobacco comment: tried tobacco a few times as a teen, never picked up the habit  Substance and Sexual Activity  . Alcohol use: Yes    Alcohol/week: 0.0 standard drinks    Comment: Rare; 2-3 times per year socially  . Drug use: No  . Sexual activity: Yes    Birth control/protection: None    Comment: 1st intercourse 49 yo-5 partners  Lifestyle  . Physical activity:    Days per week: Not on file    Minutes per session: Not on file  . Stress: Not on file  Relationships  . Social connections:    Talks on phone: Not on file    Gets together: Not on file    Attends religious service: Not on file    Active member of club or organization: Not on file    Attends meetings of clubs or organizations: Not on file    Relationship status: Not on file  . Intimate partner violence:    Fear of current or ex partner: Not on file    Emotionally abused: Not on file    Physically abused: Not on file    Forced sexual activity: Not on file  Other Topics Concern  . Not on file  Social History Narrative   Lives at home with her 2 sons. Has 6 children total, 4 from previous marriage. Husband passed away 1 year ago.   Right handed   Daily less than one cup of coffee & three 8-12 oz cups of soda daily    Family History  Problem Relation Age of Onset  . Hypertension Father   . Diabetes Father   . Hyperlipidemia Father   . Alcohol abuse Maternal Grandmother   . Alcohol abuse Maternal Grandfather   . Heart attack Paternal Grandfather   . Cerebral aneurysm Mother   . Cerebral aneurysm Maternal Aunt   . Migraines Maternal Aunt   . Colon cancer Neg Hx   . Esophageal cancer Neg Hx   . Rectal cancer Neg Hx   . Stomach cancer Neg Hx     Past Medical History:  Diagnosis Date  . Allergy   . Anemia   . Chronic hip pain   . Chronic leg pain    bilateral  . Depression    . Migraine   . PMDD (premenstrual dysphoric disorder)   . Restless leg   . S/P epidural steroid injection    affects legs is seen at pain clinic.    Past Surgical History:  Procedure Laterality Date  . CERVICAL CERCLAGE    . CESAREAN SECTION     x 2  . CESAREAN SECTION  1994  . CESAREAN SECTION  1999    Current Outpatient Medications  Medication Sig Dispense Refill  . baclofen (LIORESAL) 10 MG tablet Take 1 tablet (10 mg total) by mouth 2 (two) times daily. Take 1/2 to 1 tablet 2 x day if needed  for muscle spasm 60 tablet 0  . BIOTIN PO 10,000 mcg oral once daily    . butalbital-aspirin-caffeine (FIORINAL) 50-325-40 MG capsule TAKE 1 CAPSULE BY MOUTH EVERY 6 HOURS AS NEEDED FOR HEADACHE 30 capsule 3  . cetirizine (ZYRTEC) 10 MG tablet TAKE 1 TABLET DAILY FOR ALLERGIES 90 tablet 3  . CRANBERRY PO Take by mouth daily. With vitamin C    . diclofenac sodium (VOLTAREN) 1 % GEL Apply 1 application topically 3 (three) times daily as needed.  1  . docusate sodium (COLACE) 100 MG capsule Take 100 mg by mouth 2 (two) times daily.    . DULoxetine (CYMBALTA) 60 MG capsule TAKE 1 CAPSULE BY MOUTH EVERY DAY 90 capsule 3  . Esomeprazole Magnesium (NEXIUM PO) Take 1 tablet by mouth daily.    . fluconazole (DIFLUCAN) 150 MG tablet Take 1 tablet (150 mg total) by mouth every three (3) days as needed. 3 tablet 0  . FLUoxetine (PROZAC) 20 MG capsule TAKE 1 CAPSULE BY MOUTH EVERY DAY 90 capsule 3  . fluticasone (FLONASE) 50 MCG/ACT nasal spray INHALE 1-2 SPRAYS IN EACH NOSTRIL TWICE DAILY 16 g 3  . gabapentin (NEURONTIN) 300 MG capsule Take 1 capsule (300 mg total) by mouth 3 (three) times daily. 90 capsule 11  . HYDROcodone-acetaminophen (NORCO) 10-325 MG tablet Take 1 tablet by mouth 4 (four) times daily.     Marland Kitchen ibuprofen (ADVIL) 800 MG tablet Take 1 tablet by mouth at bedtime.     Marland Kitchen ipratropium (ATROVENT) 0.03 % nasal spray PLACE 2 SPRAYS INTO THE NOSE 3 (THREE) TIMES DAILY. (Patient not taking:  Reported on 03/25/2019) 90 mL 1  . loperamide (IMODIUM) 2 MG capsule Take 1 capsule (2 mg total) by mouth 4 (four) times daily as needed for diarrhea or loose stools. (Patient not taking: Reported on 03/25/2019) 12 capsule 0  . magnesium gluconate (MAGONATE) 500 MG tablet Take 500 mg by mouth 2 (two) times daily.    Marland Kitchen morphine (MS CONTIN) 30 MG 12 hr tablet Take 1 tablet by mouth 2 (two) times daily.    . Multiple Vitamin (MULTIVITAMIN) capsule Take 1 capsule by mouth daily.    . phenazopyridine (PYRIDIUM) 200 MG tablet Take 1 tablet (200 mg total) by mouth 3 (three) times daily with meals. Take for two days (Patient not taking: Reported on 03/25/2019) 6 tablet 0  . promethazine (PHENERGAN) 25 MG tablet Take 1 tablet (25 mg total) by mouth every 6 (six) hours as needed for nausea or vomiting. 30 tablet 11  . rizatriptan (MAXALT) 10 MG tablet May repeat in 2 hours if needed 10 tablet 0   Current Facility-Administered Medications  Medication Dose Route Frequency Provider Last Rate Last Dose  . 0.9 %  sodium chloride infusion  500 mL Intravenous Continuous Mauri Pole, MD        Allergies as of 03/26/2019 - Review Complete 03/25/2019  Allergen Reaction Noted  . Sulfa antibiotics Swelling and Rash 03/26/2012    Vitals: There were no vitals taken for this visit. Last Weight:  Wt Readings from Last 1 Encounters:  03/25/19 190 lb (86.2 kg)   Last Height:   Ht Readings from Last 1 Encounters:  12/19/18 5\' 5"  (1.651 m)   Prior exam, no changes seen on video today Physical exam: Exam: Gen: NAD, conversant, well nourised, obese, well groomed                     CV: RRR, no  MRG. No Carotid Bruits. No peripheral edema, warm, nontender Eyes: Conjunctivae clear without exudates or hemorrhage  Neuro: Detailed Neurologic Exam  Speech:    Speech is normal; fluent and spontaneous with normal comprehension.  Cognition:    The patient is oriented to person, place, and time;     recent and  remote memory intact;     language fluent;     normal attention, concentration,     fund of knowledge Cranial Nerves:    The pupils are equal, round, and reactive to light.Visual fields are reduced peripherally to finger confrontation. Extraocular movements are intact. Trigeminal sensation is intact and the muscles of mastication are normal. The face is symmetric. The palate elevates in the midline. Hearing intact. Voice is normal. Shoulder shrug is normal. The tongue has normal motion without fasciculations.   Coordination:    Normal finger to nose and heel to shin. Normal rapid alternating movements.   Gait:    Heel-toe and tandem gait are normal.   Motor Observation:    No asymmetry, no atrophy, and no involuntary movements noted. Tone:    Normal muscle tone.    Posture:    Posture is normal. normal erect    Strength:    Strength is V/V in the upper and lower limbs.      Sensation: intact to LT     Reflex Exam:  DTR's:    Deep tendon reflexes in the upper and lower extremities are normal bilaterally.   Toes:    The toes are downgoing bilaterally.   Clonus:    Clonus is absent.       Assessment/Plan: 53 year old with headaches, past medical history of migraines, recent peripheral vision loss could be migrainous but need a full evaluation.  Discussed preventative, migraines, headaches and concerning symptoms are increasing in frequency, recommended imaging, CGRP injectable or botox. She doesn't want to right now, recommend a journal for a month and counting days of headaches vs migraines and how much acute management meds taking and get back to me.   Preventative: Start gabapentin for migraine, may also help for hot flashes  - MRI brain w/wo contrast: Patient with bilateral peripheral vision loss and headaches need to evaluate for cavernous sinus lesion or mass, chiari, intracranial hypertension, pseudotumor cerebti, space-occupying mass especially given positional  quality - MRA head due to pulsatile tinnitus and family history of cerebral aneurysm in mother and maternal aunt  Orders Placed This Encounter  Procedures  . MR BRAIN W WO CONTRAST  . MR MRA HEAD WO CONTRAST   Meds ordered this encounter  Medications  . gabapentin (NEURONTIN) 300 MG capsule    Sig: Take 1 capsule (300 mg total) by mouth 3 (three) times daily.    Dispense:  90 capsule    Refill:  11  . rizatriptan (MAXALT) 10 MG tablet    Sig: May repeat in 2 hours if needed    Dispense:  10 tablet    Refill:  0  . butalbital-aspirin-caffeine (FIORINAL) 50-325-40 MG capsule    Sig: TAKE 1 CAPSULE BY MOUTH EVERY 6 HOURS AS NEEDED FOR HEADACHE    Dispense:  30 capsule    Refill:  3    This is a 3 month supply. Do not fill early.  . promethazine (PHENERGAN) 25 MG tablet    Sig: Take 1 tablet (25 mg total) by mouth every 6 (six) hours as needed for nausea or vomiting.    Dispense:  30 tablet  Refill:  11    Acute migraine management: Rizatriptan: Please take one tablet at the onset of your headache. If it does not improve the symptoms please take one additional tablet. Do not take more then 2 tablets in 24hrs. Do not take use more then 2 to 3 times in a week. May take with Phenergan. Also fiorinal if needed.   Follow Up Instructions:    I discussed the assessment and treatment plan with the patient. The patient was provided an opportunity to ask questions and all were answered. The patient agreed with the plan and demonstrated an understanding of the instructions.   The patient was advised to call back or seek an in-person evaluation if the symptoms worsen or if the condition fails to improve as anticipated.  I provided 40  minutes of video -face-to-face time during this encounter.   Melvenia Beam, MD  Discussed: To prevent or relieve headaches, try the following: Cool Compress. Lie down and place a cool compress on your head.  Avoid headache triggers. If certain foods or  odors seem to have triggered your migraines in the past, avoid them. A headache diary might help you identify triggers.  Include physical activity in your daily routine. Try a daily walk or other moderate aerobic exercise.  Manage stress. Find healthy ways to cope with the stressors, such as delegating tasks on your to-do list.  Practice relaxation techniques. Try deep breathing, yoga, massage and visualization.  Eat regularly. Eating regularly scheduled meals and maintaining a healthy diet might help prevent headaches. Also, drink plenty of fluids.  Follow a regular sleep schedule. Sleep deprivation might contribute to headaches Consider biofeedback. With this mind-body technique, you learn to control certain bodily functions - such as muscle tension, heart rate and blood pressure - to prevent headaches or reduce headache pain.    Proceed to emergency room if you experience new or worsening symptoms or symptoms do not resolve, if you have new neurologic symptoms or if headache is severe, or for any concerning symptom.   Provided education and documentation from American headache Society toolbox including articles on: chronic migraine medication overuse headache, chronic migraines, prevention of migraines, behavioral and other nonpharmacologic treatments for headache.    Sarina Ill, MD  [facility] [address] [place], [place] [address]  Phone [phone] Fax [phone]

## 2019-04-10 DIAGNOSIS — M79606 Pain in leg, unspecified: Secondary | ICD-10-CM | POA: Diagnosis not present

## 2019-04-10 DIAGNOSIS — M47816 Spondylosis without myelopathy or radiculopathy, lumbar region: Secondary | ICD-10-CM | POA: Diagnosis not present

## 2019-04-10 DIAGNOSIS — M25569 Pain in unspecified knee: Secondary | ICD-10-CM | POA: Diagnosis not present

## 2019-04-10 DIAGNOSIS — G894 Chronic pain syndrome: Secondary | ICD-10-CM | POA: Diagnosis not present

## 2019-04-10 DIAGNOSIS — M7060 Trochanteric bursitis, unspecified hip: Secondary | ICD-10-CM | POA: Diagnosis not present

## 2019-04-16 ENCOUNTER — Ambulatory Visit: Payer: Federal, State, Local not specified - PPO

## 2019-04-24 ENCOUNTER — Other Ambulatory Visit: Payer: Self-pay | Admitting: Neurology

## 2019-04-24 DIAGNOSIS — H93A9 Pulsatile tinnitus, unspecified ear: Secondary | ICD-10-CM

## 2019-04-24 DIAGNOSIS — R51 Headache with orthostatic component, not elsewhere classified: Secondary | ICD-10-CM

## 2019-04-24 DIAGNOSIS — R519 Headache, unspecified: Secondary | ICD-10-CM

## 2019-04-24 DIAGNOSIS — G4484 Primary exertional headache: Secondary | ICD-10-CM

## 2019-04-24 DIAGNOSIS — H547 Unspecified visual loss: Secondary | ICD-10-CM

## 2019-04-24 DIAGNOSIS — G441 Vascular headache, not elsewhere classified: Secondary | ICD-10-CM

## 2019-05-04 ENCOUNTER — Other Ambulatory Visit: Payer: Self-pay | Admitting: Neurology

## 2019-05-08 DIAGNOSIS — M47816 Spondylosis without myelopathy or radiculopathy, lumbar region: Secondary | ICD-10-CM | POA: Diagnosis not present

## 2019-05-08 DIAGNOSIS — M25551 Pain in right hip: Secondary | ICD-10-CM | POA: Diagnosis not present

## 2019-05-08 DIAGNOSIS — G894 Chronic pain syndrome: Secondary | ICD-10-CM | POA: Diagnosis not present

## 2019-05-08 DIAGNOSIS — Z79899 Other long term (current) drug therapy: Secondary | ICD-10-CM | POA: Diagnosis not present

## 2019-05-08 DIAGNOSIS — Z79891 Long term (current) use of opiate analgesic: Secondary | ICD-10-CM | POA: Diagnosis not present

## 2019-05-08 DIAGNOSIS — M7060 Trochanteric bursitis, unspecified hip: Secondary | ICD-10-CM | POA: Diagnosis not present

## 2019-05-13 DIAGNOSIS — H40033 Anatomical narrow angle, bilateral: Secondary | ICD-10-CM | POA: Diagnosis not present

## 2019-05-13 DIAGNOSIS — H43393 Other vitreous opacities, bilateral: Secondary | ICD-10-CM | POA: Diagnosis not present

## 2019-05-15 DIAGNOSIS — H43812 Vitreous degeneration, left eye: Secondary | ICD-10-CM | POA: Diagnosis not present

## 2019-05-26 NOTE — Progress Notes (Signed)
[facility] Note  05/27/2019     CHIEF COMPLAINT Patient presents for Flashes/floaters   HISTORY OF PRESENT ILLNESS: Christine Frank is a 53 y.o. female who presents to the clinic today for:   HPI    Flashes/floaters    In left eye.  This started weeks ago.  Duration of weeks.  Characterized as small and spots.  I, the attending physician,  performed the HPI with the patient and updated documentation appropriately.          Comments    Patient noticed new floaters in her left eye about 2 weeks ago.  Patient did not notice any flashes of light but states now the floaters are intermittent.  Patient states her left eye is blurry.       Last edited by [name], MD on 05/27/2019 11:47 PM. (History)    pt states she saw Dr. [name] last week bc she had some new floaters in her left eye, she states she saw Dr. 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 [name] as well and he also stated that he did not see a tear, pt denies history of hypertention or diabetes, but states she has been receiving steroid injections in her hips that has raised her blood sugar and blood pressure, pt states she has stopped taking them bc of this, pt states when she was 9 she had a swollen optic nerve and states the drs thought it was a brain tumor, she states it did not impact her vision, but her left eye has always been weaker than the right, pt denies seeing flashing lights  Referring physician: [name], MD [address] [place],  [place] [address]  HISTORICAL INFORMATION:   Selected notes from the MEDICAL RECORD NUMBER Referred by Dr. [name] for concern on PVD / new floaters OS LEE: 06.18.20 (D. Digby) [BCVA: OD: 20/25 OS: 20/25] Ocular Hx-cataracts OU, floaters OS PMH-allergies, anemia, depression, migraine   CURRENT MEDICATIONS: No current  outpatient medications on file. (Ophthalmic Drugs)   No current facility-administered medications for this visit.  (Ophthalmic Drugs)   Current Outpatient Medications (Other)  Medication Sig  . baclofen (LIORESAL) 10 MG tablet Take 1 tablet (10 mg total) by mouth 2 (two) times daily. Take 1/2 to 1 tablet 2 x day if needed for muscle spasm  . BIOTIN PO 10,000 mcg oral once daily  . butalbital-aspirin-caffeine (FIORINAL) 50-325-40 MG capsule TAKE 1 CAPSULE BY MOUTH EVERY 6 HOURS AS NEEDED FOR HEADACHE  . cetirizine (ZYRTEC) 10 MG tablet TAKE 1 TABLET DAILY FOR ALLERGIES  . CRANBERRY PO Take by mouth daily. With vitamin C  . diclofenac sodium (VOLTAREN) 1 % GEL Apply 1 application topically 3 (three) times daily as needed.  . docusate sodium (COLACE) 100 MG capsule Take 100 mg by mouth 2 (two) times daily.  . DULoxetine (CYMBALTA) 60 MG capsule TAKE 1 CAPSULE BY MOUTH EVERY DAY  . Esomeprazole Magnesium (NEXIUM PO) Take 1 tablet by mouth daily.  . fluconazole (DIFLUCAN) 150 MG tablet Take 1 tablet (150 mg total) by mouth every three (3) days as needed.  Marland Kitchen FLUoxetine (PROZAC) 20 MG capsule TAKE 1 CAPSULE BY MOUTH EVERY DAY  . fluticasone (FLONASE) 50 MCG/ACT nasal spray INHALE 1-2 SPRAYS IN EACH NOSTRIL TWICE DAILY  . HYDROcodone-acetaminophen (NORCO) 10-325 MG  tablet Take 1 tablet by mouth 4 (four) times daily.   Marland Kitchen ibuprofen (ADVIL) 800 MG tablet Take 1 tablet by mouth at bedtime.   Marland Kitchen morphine (MS CONTIN) 30 MG 12 hr tablet Take 1 tablet by mouth 2 (two) times daily.  . Multiple Vitamin (MULTIVITAMIN) capsule Take 1 capsule by mouth daily.  . phenazopyridine (PYRIDIUM) 200 MG tablet Take 1 tablet (200 mg total) by mouth 3 (three) times daily with meals. Take for two days  . promethazine (PHENERGAN) 25 MG tablet Take 1 tablet (25 mg total) by mouth every 6 (six) hours as needed for nausea or vomiting.  . rizatriptan (MAXALT) 10 MG tablet MAY REPEAT IN 2 HOURS IF NEEDED  . gabapentin (NEURONTIN)  300 MG capsule TAKE 1 CAPSULE BY MOUTH THREE TIMES A DAY (Patient not taking: Reported on 05/27/2019)  . ipratropium (ATROVENT) 0.03 % nasal spray PLACE 2 SPRAYS INTO THE NOSE 3 (THREE) TIMES DAILY. (Patient not taking: Reported on 03/25/2019)  . loperamide (IMODIUM) 2 MG capsule Take 1 capsule (2 mg total) by mouth 4 (four) times daily as needed for diarrhea or loose stools. (Patient not taking: Reported on 03/25/2019)  . magnesium gluconate (MAGONATE) 500 MG tablet Take 500 mg by mouth 2 (two) times daily.   Current Facility-Administered Medications (Other)  Medication Route  . 0.9 %  sodium chloride infusion Intravenous      REVIEW OF SYSTEMS: ROS    Positive for: Eyes   Negative for: Constitutional, Gastrointestinal, Neurological, Skin, Genitourinary, Musculoskeletal, HENT, Endocrine, Cardiovascular, Respiratory, Psychiatric, Allergic/Imm, Heme/Lymph   Last edited by Doneen Poisson on 05/27/2019  1:32 PM. (History)       ALLERGIES Allergies  Allergen Reactions  . Sulfa Antibiotics Swelling and Rash    PAST MEDICAL HISTORY Past Medical History:  Diagnosis Date  . Allergy   . Anemia   . Chronic hip pain   . Chronic leg pain    bilateral  . Depression   . Migraine   . PMDD (premenstrual dysphoric disorder)   . Restless leg   . S/P epidural steroid injection    affects legs is seen at pain clinic.   Past Surgical History:  Procedure Laterality Date  . CERVICAL CERCLAGE    . CESAREAN SECTION     x 2  . CESAREAN SECTION  1994  . CESAREAN SECTION  1999    FAMILY HISTORY Family History  Problem Relation Age of Onset  . Hypertension Father   . Diabetes Father   . Hyperlipidemia Father   . Alcohol abuse Maternal Grandmother   . Alcohol abuse Maternal Grandfather   . Heart attack Paternal Grandfather   . Cerebral aneurysm Mother   . Cerebral aneurysm Maternal Aunt   . Migraines Maternal Aunt   . Colon cancer Neg Hx   . Esophageal cancer Neg Hx   . Rectal  cancer Neg Hx   . Stomach cancer Neg Hx     SOCIAL HISTORY Social History   Tobacco Use  . Smoking status: Never Smoker  . Smokeless tobacco: Never Used  . Tobacco comment: tried tobacco a few times as a teen, never picked up the habit  Substance Use Topics  . Alcohol use: Yes    Alcohol/week: 0.0 standard drinks    Comment: Rare; 2-3 times per year socially  . Drug use: No         OPHTHALMIC EXAM:  Base Eye Exam    Visual Acuity (Snellen - Linear)  Right Left   Dist [place] 20/25 -2 20/30 -1   Dist ph [place] 20/20 -1 20       Tonometry (Tonopen, 1:19 PM)      Right Left   Pressure 18 21       Pupils      Dark Light Shape React APD   Right 4 3 Round Minimal 0   Left 4 3 Round Minimal 0       Extraocular Movement      Right Left    Full Full       Neuro/Psych    Oriented x3: Yes   Mood/Affect: Normal       Dilation    Both eyes: 1.0% Mydriacyl, 2.5% Phenylephrine @ 1:20 PM        Slit Lamp and Fundus Exam    Slit Lamp Exam      Right Left   Lids/Lashes Dermatochalasis - upper lid Dermatochalasis - upper lid   Conjunctiva/Sclera White and quiet White and quiet   Cornea trace Punctate epithelial erosions 1+Punctate epithelial erosions, mildly decreased TBUT   Anterior Chamber Deep and quiet Deep and quiet   Iris Round and dilated Round and dilated   Lens 1-2+ Nuclear sclerosis, 2+ Cortical cataract 1-2+ Nuclear sclerosis, 2+ Cortical cataract   Vitreous clear Posterior vitreous detachment, vitreous condensations       Fundus Exam      Right Left   Disc Pink and Sharp Compact, Pink and Sharp   C/D Ratio 0.1 0.1   Macula Blunted foveal reflex, mild Retinal pigment epithelial mottling, No heme or edema Good foveal reflex, mild Retinal pigment epithelial mottling, No heme or edema   Vessels Vascular attenuation, AV crossing changes Vascular attenuation, Tortuous, very tortuous venules   Periphery Attached, No heme  Attached, punctate IRH at 0300  periphery, No RT/RD on 360 scleral depression         Refraction    Wearing Rx      Sphere   Right OTC readers   Left OTC readers   Type: OTC readers       Manifest Refraction      Sphere Cylinder Dist VA   Right -0.25 Sphere 20/20   Left -0.50 Sphere 20/20          IMAGING AND PROCEDURES  Imaging and Procedures for @TODAY @  OCT, Retina - OU - Both Eyes       Right Eye Quality was good. Central Foveal Thickness: 272. Progression has no prior data. Findings include normal foveal contour, no IRF, no SRF (Partial PVD).   Left Eye Quality was good. Central Foveal Thickness: 290. Progression has no prior data. Findings include normal foveal contour, no IRF, no SRF.   Notes *Images captured and stored on drive  Diagnosis / Impression:  NFP, no IRF/SRF OU Partial PVD OD  Clinical management:  See below  Abbreviations: NFP - Normal foveal profile. CME - cystoid macular edema. PED - pigment epithelial detachment. IRF - intraretinal fluid. SRF - subretinal fluid. EZ - ellipsoid zone. ERM - epiretinal membrane. ORA - outer retinal atrophy. ORT - outer retinal tubulation. SRHM - subretinal hyper-reflective material                 ASSESSMENT/PLAN:    ICD-10-CM   1. Posterior vitreous detachment of left eye  H43.812   2. Retinal edema  H35.81 OCT, Retina - OU - Both Eyes  3. Essential hypertension  I10   4. Hypertensive retinopathy  of both eyes  H35.033   5. Combined forms of age-related cataract of both eyes  H25.813     1. PVD / vitreous syneresis OS  - subacute -- onset ~2 wks ago and has seen Dr. [name] and Dr. [name]  - symptomatic floaters OS  - Discussed findings and prognosis  - No RT or RD on 360 scleral depressed exam  - Reviewed s/s of RT/RD  - Strict return precautions for any such RT/RD signs/symptoms  - f/u 3-4 weeks, sooner prn  2. No retinal edema on exam or OCT  3,4. Hypertensive retinopathy OU  - discussed importance of tight BP control  -  monitor  5. Mixed form age related cataract  - The symptoms of cataract, surgical options, and treatments and risks were discussed with patient.  - discussed diagnosis and progression  - not yet visually significant  - monitor for now  Ophthalmic Meds Ordered this visit:  No orders of the defined types were placed in this encounter.      Return for f/u 3-4 weeks, PVD OS, DFE, OCT.  There are no Patient Instructions on file for this visit.   Explained the diagnoses, plan, and follow up with the patient and they expressed understanding.  Patient expressed understanding of the importance of proper follow up care.   This document serves as a record of services personally performed by Gardiner Sleeper, MD, PhD. It was created on their behalf by Ernest Mallick, OA, an ophthalmic assistant. The creation of this record is the provider's dictation and/or activities during the visit.    Electronically signed by: Ernest Mallick, OA  06.29.2020 11:50 PM    Gardiner Sleeper, M.D., Ph.D. Diseases & Surgery of the Retina and Vitreous [facility]  I have reviewed the above documentation for accuracy and completeness, and I agree with the above. Gardiner Sleeper, M.D., Ph.D. 05/27/19 11:51 PM    Abbreviations: M myopia (nearsighted); A astigmatism; H hyperopia (farsighted); P presbyopia; Mrx spectacle prescription;  CTL contact lenses; OD right eye; OS left eye; OU both eyes  XT exotropia; ET esotropia; PEK punctate epithelial keratitis; PEE punctate epithelial erosions; DES dry eye syndrome; MGD meibomian gland dysfunction; ATs artificial tears; PFAT's preservative free artificial tears; [place] nuclear sclerotic cataract; PSC posterior subcapsular cataract; ERM epi-retinal membrane; PVD posterior vitreous detachment; RD retinal detachment; DM diabetes mellitus; DR diabetic retinopathy; NPDR non-proliferative diabetic retinopathy; PDR proliferative diabetic retinopathy; CSME clinically  significant macular edema; DME diabetic macular edema; dbh dot blot hemorrhages; CWS cotton wool spot; POAG primary open angle glaucoma; C/D cup-to-disc ratio; HVF humphrey visual field; GVF goldmann visual field; OCT optical coherence tomography; IOP intraocular pressure; BRVO Branch retinal vein occlusion; CRVO central retinal vein occlusion; CRAO central retinal artery occlusion; BRAO branch retinal artery occlusion; RT retinal tear; SB scleral buckle; PPV pars plana vitrectomy; VH Vitreous hemorrhage; PRP panretinal laser photocoagulation; IVK intravitreal kenalog; VMT vitreomacular traction; MH Macular hole;  NVD neovascularization of the disc; NVE neovascularization elsewhere; AREDS age related eye disease study; ARMD age related macular degeneration; POAG primary open angle glaucoma; EBMD epithelial/anterior basement membrane dystrophy; ACIOL anterior chamber intraocular lens; IOL intraocular lens; PCIOL posterior chamber intraocular lens; Phaco/IOL phacoemulsification with intraocular lens placement; [place] photorefractive keratectomy; LASIK laser assisted in situ keratomileusis; HTN hypertension; DM diabetes mellitus; COPD chronic obstructive pulmonary disease

## 2019-05-27 ENCOUNTER — Other Ambulatory Visit: Payer: Self-pay

## 2019-05-27 ENCOUNTER — Ambulatory Visit (INDEPENDENT_AMBULATORY_CARE_PROVIDER_SITE_OTHER): Payer: Federal, State, Local not specified - PPO | Admitting: Ophthalmology

## 2019-05-27 ENCOUNTER — Encounter (INDEPENDENT_AMBULATORY_CARE_PROVIDER_SITE_OTHER): Payer: Self-pay | Admitting: Ophthalmology

## 2019-05-27 DIAGNOSIS — I10 Essential (primary) hypertension: Secondary | ICD-10-CM

## 2019-05-27 DIAGNOSIS — H43812 Vitreous degeneration, left eye: Secondary | ICD-10-CM

## 2019-05-27 DIAGNOSIS — H3581 Retinal edema: Secondary | ICD-10-CM

## 2019-05-27 DIAGNOSIS — H25813 Combined forms of age-related cataract, bilateral: Secondary | ICD-10-CM

## 2019-05-27 DIAGNOSIS — H35033 Hypertensive retinopathy, bilateral: Secondary | ICD-10-CM | POA: Diagnosis not present

## 2019-05-28 ENCOUNTER — Ambulatory Visit
Admission: RE | Admit: 2019-05-28 | Discharge: 2019-05-28 | Disposition: A | Discharge status: Home / Self Care | Payer: Federal, State, Local not specified - PPO | Source: Ambulatory Visit | Attending: Adult Health | Admitting: Adult Health

## 2019-05-28 DIAGNOSIS — Z1231 Encounter for screening mammogram for malignant neoplasm of breast: Secondary | ICD-10-CM

## 2019-06-06 ENCOUNTER — Other Ambulatory Visit: Payer: Self-pay | Admitting: Neurology

## 2019-06-09 DIAGNOSIS — M1612 Unilateral primary osteoarthritis, left hip: Secondary | ICD-10-CM | POA: Diagnosis not present

## 2019-06-09 DIAGNOSIS — M7061 Trochanteric bursitis, right hip: Secondary | ICD-10-CM | POA: Diagnosis not present

## 2019-06-09 DIAGNOSIS — G894 Chronic pain syndrome: Secondary | ICD-10-CM | POA: Diagnosis not present

## 2019-06-09 DIAGNOSIS — M5136 Other intervertebral disc degeneration, lumbar region: Secondary | ICD-10-CM | POA: Diagnosis not present

## 2019-06-16 NOTE — Progress Notes (Addendum)
[facility] Note  06/17/2019     CHIEF COMPLAINT Patient presents for Retina Follow Up   HISTORY OF PRESENT ILLNESS: Christine Frank is a 53 y.o. female who presents to the clinic today for:   HPI    Retina Follow Up    Patient presents with  PVD.  In left eye.  This started 3 weeks ago.  Severity is moderate.  I, the attending physician,  performed the HPI with the patient and updated documentation appropriately.          Comments    Patient here for 3 weeks retina follow up for PVD OD. Patient states vision still about the same. Floaters are still there on occasion. Not as noticeable. Blurry where the floaters are but not as bad. No eye pain.       Last edited by [name], MD on 06/17/2019  1:51 PM. (History)     follow-up for PVD OS. Patient states still sees floaters on occasion. Floaters not as bad as on initial visit.   Referring physician: [name], MD [address] [place],  [place] [address]  HISTORICAL INFORMATION:   Selected notes from the MEDICAL RECORD NUMBER Referred by Dr. [name] for concern on PVD / new floaters OS LEE: 06.18.20 (D. Digby) [BCVA: OD: 20/25 OS: 20/25] Ocular Hx-cataracts OU, floaters OS PMH-allergies, anemia, depression, migraine   CURRENT MEDICATIONS: No current outpatient medications on file. (Ophthalmic Drugs)   No current facility-administered medications for this visit.  (Ophthalmic Drugs)   Current Outpatient Medications (Other)  Medication Sig  . baclofen (LIORESAL) 10 MG tablet Take 1 tablet (10 mg total) by mouth 2 (two) times daily. Take 1/2 to 1 tablet 2 x day if needed for muscle spasm  . BIOTIN PO 10,000 mcg oral once daily  . butalbital-aspirin-caffeine (FIORINAL) 50-325-40 MG capsule TAKE 1 CAPSULE BY MOUTH EVERY 6 HOURS AS NEEDED FOR HEADACHE  . cetirizine (ZYRTEC) 10 MG tablet TAKE 1 TABLET DAILY FOR ALLERGIES  . CRANBERRY PO Take by mouth daily. With vitamin C   . diclofenac sodium (VOLTAREN) 1 % GEL Apply 1 application topically 3 (three) times daily as needed.  . docusate sodium (COLACE) 100 MG capsule Take 100 mg by mouth 2 (two) times daily.  . DULoxetine (CYMBALTA) 60 MG capsule TAKE 1 CAPSULE BY MOUTH EVERY DAY  . Esomeprazole Magnesium (NEXIUM PO) Take 1 tablet by mouth daily.  . fluconazole (DIFLUCAN) 150 MG tablet Take 1 tablet (150 mg total) by mouth every three (3) days as needed.  Marland Kitchen FLUoxetine (PROZAC) 20 MG capsule TAKE 1 CAPSULE BY MOUTH EVERY DAY  . fluticasone (FLONASE) 50 MCG/ACT nasal spray INHALE 1-2 SPRAYS IN EACH NOSTRIL TWICE DAILY  . gabapentin (NEURONTIN) 300 MG capsule TAKE 1 CAPSULE BY MOUTH THREE TIMES A DAY (Patient not taking: Reported on 05/27/2019)  . HYDROcodone-acetaminophen (NORCO) 10-325 MG tablet Take 1 tablet by mouth 4 (four) times daily.   Marland Kitchen ibuprofen (ADVIL) 800 MG tablet Take 1 tablet by mouth at bedtime.   Marland Kitchen ipratropium (ATROVENT) 0.03 % nasal spray PLACE 2 SPRAYS INTO THE NOSE 3 (THREE) TIMES DAILY. (Patient not taking: Reported on 03/25/2019)  . loperamide (IMODIUM) 2 MG capsule Take 1 capsule (2 mg total) by mouth 4 (four) times daily as needed for diarrhea or loose stools. (Patient not taking: Reported on 03/25/2019)  . magnesium gluconate (MAGONATE) 500 MG tablet Take 500 mg by mouth 2 (two) times  daily.  . morphine (MS CONTIN) 30 MG 12 hr tablet Take 1 tablet by mouth 2 (two) times daily.  . Multiple Vitamin (MULTIVITAMIN) capsule Take 1 capsule by mouth daily.  . phenazopyridine (PYRIDIUM) 200 MG tablet Take 1 tablet (200 mg total) by mouth 3 (three) times daily with meals. Take for two days  . promethazine (PHENERGAN) 25 MG tablet Take 1 tablet (25 mg total) by mouth every 6 (six) hours as needed for nausea or vomiting.  . rizatriptan (MAXALT) 10 MG tablet MAY REPEAT IN 2 HOURS IF NEEDED   Current Facility-Administered Medications (Other)  Medication Route  . 0.9 %  sodium chloride infusion Intravenous       REVIEW OF SYSTEMS: ROS    Positive for: Eyes   Negative for: Constitutional, Gastrointestinal, Neurological, Skin, Genitourinary, Musculoskeletal, HENT, Endocrine, Cardiovascular, Respiratory, Psychiatric, Allergic/Imm, Heme/Lymph   Last edited by Theodore Demark on 06/17/2019  1:45 PM. (History)       ALLERGIES Allergies  Allergen Reactions  . Sulfa Antibiotics Swelling and Rash    PAST MEDICAL HISTORY Past Medical History:  Diagnosis Date  . Allergy   . Anemia   . Chronic hip pain   . Chronic leg pain    bilateral  . Depression   . Migraine   . PMDD (premenstrual dysphoric disorder)   . Restless leg   . S/P epidural steroid injection    affects legs is seen at pain clinic.   Past Surgical History:  Procedure Laterality Date  . CERVICAL CERCLAGE    . CESAREAN SECTION     x 2  . CESAREAN SECTION  1994  . CESAREAN SECTION  1999    FAMILY HISTORY Family History  Problem Relation Age of Onset  . Hypertension Father   . Diabetes Father   . Hyperlipidemia Father   . Alcohol abuse Maternal Grandmother   . Alcohol abuse Maternal Grandfather   . Heart attack Paternal Grandfather   . Cerebral aneurysm Mother   . Cerebral aneurysm Maternal Aunt   . Migraines Maternal Aunt   . Colon cancer Neg Hx   . Esophageal cancer Neg Hx   . Rectal cancer Neg Hx   . Stomach cancer Neg Hx     SOCIAL HISTORY Social History   Tobacco Use  . Smoking status: Never Smoker  . Smokeless tobacco: Never Used  . Tobacco comment: tried tobacco a few times as a teen, never picked up the habit  Substance Use Topics  . Alcohol use: Yes    Alcohol/week: 0.0 standard drinks    Comment: Rare; 2-3 times per year socially  . Drug use: No         OPHTHALMIC EXAM:  Base Eye Exam    Visual Acuity (Snellen - Linear)      Right Left   Dist [place] 20/20 -2 20/30 +1   Dist ph [place]  20/20 -1       Tonometry (Tonopen, 1:40 PM)      Right Left   Pressure 11 09       Pupils       Dark Light Shape React APD   Right 4 3 Round Slow None   Left 4 3 Round Slow None       Visual Fields (Counting fingers)      Left Right    Full Full       Extraocular Movement      Right Left    Full, Ortho Full, Ortho  Neuro/Psych    Oriented x3: Yes   Mood/Affect: Normal       Dilation    Both eyes: 1.0% Mydriacyl, 2.5% Phenylephrine @ 1:40 PM        Slit Lamp and Fundus Exam    Slit Lamp Exam      Right Left   Lids/Lashes Dermatochalasis - upper lid Dermatochalasis - upper lid   Conjunctiva/Sclera White and quiet White and quiet   Cornea trace Punctate epithelial erosions 1+Punctate epithelial erosions   Anterior Chamber Deep and quiet Deep and quiet   Iris Round and dilated Round and dilated   Lens 1-2+ Nuclear sclerosis, 2+ Cortical cataract 1-2+ Nuclear sclerosis, 2+ Cortical cataract   Vitreous clear Posterior vitreous detachment, vitreous condensations       Fundus Exam      Right Left   Disc Pink and Sharp Compact, Pink and Sharp   C/D Ratio 0.1 0.1   Macula good foveal reflex, mild Retinal pigment epithelial mottling, No heme or edema Good foveal reflex, mild Retinal pigment epithelial mottling, No heme or edema   Vessels Vascular attenuation, AV crossing changes, Tortuous Vascular attenuation, Tortuous, very tortuous venules, + copper wiring   Periphery Attached, No heme  Attached, punctate IRH at 0300 periphery--gone, No RT/RD 360        Refraction    Wearing Rx      Sphere   Right OTC readers   Left OTC readers   Type: OTC readers          IMAGING AND PROCEDURES  Imaging and Procedures for @TODAY @  OCT, Retina - OU - Both Eyes       Right Eye Quality was good. Central Foveal Thickness: 279. Progression has been stable. Findings include normal foveal contour, no IRF, no SRF (Partial PVD).   Left Eye Quality was good. Central Foveal Thickness: 292. Progression has been stable. Findings include normal foveal contour, no IRF, no  SRF.   Notes *Images captured and stored on drive  Diagnosis / Impression:  NFP, no IRF/SRF OU--stable Partial PVD OD--stable  Clinical management:  See below  Abbreviations: NFP - Normal foveal profile. CME - cystoid macular edema. PED - pigment epithelial detachment. IRF - intraretinal fluid. SRF - subretinal fluid. EZ - ellipsoid zone. ERM - epiretinal membrane. ORA - outer retinal atrophy. ORT - outer retinal tubulation. SRHM - subretinal hyper-reflective material                 ASSESSMENT/PLAN:    ICD-10-CM   1. Posterior vitreous detachment of left eye  H43.812   2. Retinal edema  H35.81 OCT, Retina - OU - Both Eyes  3. Essential hypertension  I10   4. Hypertensive retinopathy of both eyes  H35.033   5. Combined forms of age-related cataract of both eyes  H25.813     1. PVD / vitreous syneresis OS  - subacute -- onset ~mid June 2020 and has seen Dr. [name] and Dr. [name]  - symptomatic floaters improving today OS  - Discussed findings and prognosis  - No RT or RD on 360 scleral depressed exam  - Reviewed s/s of RT/RD  - Strict return precautions for any such RT/RD signs/symptoms  - f/u here prn  - clear from a retina standpoint to resume primary eye care at [facility]  2. No retinal edema on exam or OCT  3,4. Hypertensive retinopathy OU  - discussed importance of tight BP control  - monitor  5. Mixed  form age related cataract  - The symptoms of cataract, surgical options, and treatments and risks were discussed with patient.  - discussed diagnosis and progression  - not yet visually significant  - monitor for now  Ophthalmic Meds Ordered this visit:  No orders of the defined types were placed in this encounter.      Return if symptoms worsen or fail to improve.  There are no Patient Instructions on file for this visit.   Explained the diagnoses, plan, and follow up with the patient and they expressed understanding.  Patient expressed  understanding of the importance of proper follow up care.   This document serves as a record of services personally performed by Gardiner Sleeper, MD, PhD. It was created on their behalf by Ernest Mallick, OA, an ophthalmic assistant. The creation of this record is the provider's dictation and/or activities during the visit.    Electronically signed by: Ernest Mallick, OA  07.20.2020 2:23 PM    Gardiner Sleeper, M.D., Ph.D. Diseases & Surgery of the Retina and Vitreous [facility]  I have reviewed the above documentation for accuracy and completeness, and I agree with the above. Gardiner Sleeper, M.D., Ph.D. 06/17/19 2:23 PM     Abbreviations: M myopia (nearsighted); A astigmatism; H hyperopia (farsighted); P presbyopia; Mrx spectacle prescription;  CTL contact lenses; OD right eye; OS left eye; OU both eyes  XT exotropia; ET esotropia; PEK punctate epithelial keratitis; PEE punctate epithelial erosions; DES dry eye syndrome; MGD meibomian gland dysfunction; ATs artificial tears; PFAT's preservative free artificial tears; [place] nuclear sclerotic cataract; PSC posterior subcapsular cataract; ERM epi-retinal membrane; PVD posterior vitreous detachment; RD retinal detachment; DM diabetes mellitus; DR diabetic retinopathy; NPDR non-proliferative diabetic retinopathy; PDR proliferative diabetic retinopathy; CSME clinically significant macular edema; DME diabetic macular edema; dbh dot blot hemorrhages; CWS cotton wool spot; POAG primary open angle glaucoma; C/D cup-to-disc ratio; HVF humphrey visual field; GVF goldmann visual field; OCT optical coherence tomography; IOP intraocular pressure; BRVO Branch retinal vein occlusion; CRVO central retinal vein occlusion; CRAO central retinal artery occlusion; BRAO branch retinal artery occlusion; RT retinal tear; SB scleral buckle; PPV pars plana vitrectomy; VH Vitreous hemorrhage; PRP panretinal laser photocoagulation; IVK intravitreal kenalog;  VMT vitreomacular traction; MH Macular hole;  NVD neovascularization of the disc; NVE neovascularization elsewhere; AREDS age related eye disease study; ARMD age related macular degeneration; POAG primary open angle glaucoma; EBMD epithelial/anterior basement membrane dystrophy; ACIOL anterior chamber intraocular lens; IOL intraocular lens; PCIOL posterior chamber intraocular lens; Phaco/IOL phacoemulsification with intraocular lens placement; [place] photorefractive keratectomy; LASIK laser assisted in situ keratomileusis; HTN hypertension; DM diabetes mellitus; COPD chronic obstructive pulmonary disease

## 2019-06-17 ENCOUNTER — Other Ambulatory Visit: Payer: Self-pay

## 2019-06-17 ENCOUNTER — Other Ambulatory Visit: Payer: Federal, State, Local not specified - PPO

## 2019-06-17 ENCOUNTER — Ambulatory Visit (INDEPENDENT_AMBULATORY_CARE_PROVIDER_SITE_OTHER): Payer: Federal, State, Local not specified - PPO | Admitting: Ophthalmology

## 2019-06-17 ENCOUNTER — Encounter (INDEPENDENT_AMBULATORY_CARE_PROVIDER_SITE_OTHER): Payer: Self-pay | Admitting: Ophthalmology

## 2019-06-17 DIAGNOSIS — E559 Vitamin D deficiency, unspecified: Secondary | ICD-10-CM

## 2019-06-17 DIAGNOSIS — H35033 Hypertensive retinopathy, bilateral: Secondary | ICD-10-CM | POA: Diagnosis not present

## 2019-06-17 DIAGNOSIS — H3581 Retinal edema: Secondary | ICD-10-CM

## 2019-06-17 DIAGNOSIS — I10 Essential (primary) hypertension: Secondary | ICD-10-CM

## 2019-06-17 DIAGNOSIS — Z0000 Encounter for general adult medical examination without abnormal findings: Secondary | ICD-10-CM | POA: Diagnosis not present

## 2019-06-17 DIAGNOSIS — H25813 Combined forms of age-related cataract, bilateral: Secondary | ICD-10-CM

## 2019-06-17 DIAGNOSIS — H43812 Vitreous degeneration, left eye: Secondary | ICD-10-CM | POA: Diagnosis not present

## 2019-06-18 LAB — LIPID PANEL
Chol/HDL Ratio: 3.6 ratio (ref 0.0–4.4)
Cholesterol, Total: 178 mg/dL (ref 100–199)
HDL: 49 mg/dL (ref >39)
LDL Calculated: 94 mg/dL (ref 0–99)
Triglycerides: 173 mg/dL — ABNORMAL HIGH (ref 0–149)
VLDL Cholesterol Cal: 35 mg/dL (ref 5–40)

## 2019-06-18 LAB — COMPREHENSIVE METABOLIC PANEL
ALT: 17 IU/L (ref 0–32)
AST: 20 IU/L (ref 0–40)
Albumin/Globulin Ratio: 1.4 (ref 1.2–2.2)
Albumin: 3.9 g/dL (ref 3.8–4.9)
Alkaline Phosphatase: 87 IU/L (ref 39–117)
BUN/Creatinine Ratio: 7 — ABNORMAL LOW (ref 9–23)
BUN: 5 mg/dL — ABNORMAL LOW (ref 6–24)
Bilirubin Total: 0.3 mg/dL (ref 0.0–1.2)
CO2: 22 mmol/L (ref 20–29)
Calcium: 9.3 mg/dL (ref 8.7–10.2)
Chloride: 102 mmol/L (ref 96–106)
Creatinine, Ser: 0.74 mg/dL (ref 0.57–1.00)
GFR calc Af Amer: 107 mL/min/{1.73_m2} (ref >59)
GFR calc non Af Amer: 93 mL/min/{1.73_m2} (ref >59)
Globulin, Total: 2.8 g/dL (ref 1.5–4.5)
Glucose: 150 mg/dL — ABNORMAL HIGH (ref 65–99)
Potassium: 3.4 mmol/L — ABNORMAL LOW (ref 3.5–5.2)
Sodium: 142 mmol/L (ref 134–144)
Total Protein: 6.7 g/dL (ref 6.0–8.5)

## 2019-06-18 LAB — CBC WITH DIFFERENTIAL/PLATELET
Basophils Absolute: 0.1 10*3/uL (ref 0.0–0.2)
Basos: 1 %
EOS (ABSOLUTE): 0.2 10*3/uL (ref 0.0–0.4)
Eos: 3 %
Hematocrit: 35.4 % (ref 34.0–46.6)
Hemoglobin: 12 g/dL (ref 11.1–15.9)
Immature Grans (Abs): 0 10*3/uL (ref 0.0–0.1)
Immature Granulocytes: 0 %
Lymphocytes Absolute: 2.5 10*3/uL (ref 0.7–3.1)
Lymphs: 30 %
MCH: 29.5 pg (ref 26.6–33.0)
MCHC: 33.9 g/dL (ref 31.5–35.7)
MCV: 87 fL (ref 79–97)
Monocytes Absolute: 0.8 10*3/uL (ref 0.1–0.9)
Monocytes: 9 %
Neutrophils Absolute: 4.9 10*3/uL (ref 1.4–7.0)
Neutrophils: 57 %
Platelets: 529 10*3/uL — ABNORMAL HIGH (ref 150–450)
RBC: 4.07 x10E6/uL (ref 3.77–5.28)
RDW: 12.5 % (ref 11.7–15.4)
WBC: 8.4 10*3/uL (ref 3.4–10.8)

## 2019-06-18 LAB — HEMOGLOBIN A1C
Est. average glucose Bld gHb Est-mCnc: 114 mg/dL
Hgb A1c MFr Bld: 5.6 % (ref 4.8–5.6)

## 2019-06-18 LAB — VITAMIN D 25 HYDROXY (VIT D DEFICIENCY, FRACTURES): Vit D, 25-Hydroxy: 36.3 ng/mL (ref 30.0–100.0)

## 2019-06-18 LAB — TSH: TSH: 1.01 u[IU]/mL (ref 0.450–4.500)

## 2019-06-24 ENCOUNTER — Encounter: Payer: Federal, State, Local not specified - PPO | Admitting: Adult Health

## 2019-07-09 DIAGNOSIS — G894 Chronic pain syndrome: Secondary | ICD-10-CM | POA: Diagnosis not present

## 2019-07-09 DIAGNOSIS — M24152 Other articular cartilage disorders, left hip: Secondary | ICD-10-CM | POA: Diagnosis not present

## 2019-07-09 DIAGNOSIS — M25561 Pain in right knee: Secondary | ICD-10-CM | POA: Diagnosis not present

## 2019-07-09 DIAGNOSIS — M7061 Trochanteric bursitis, right hip: Secondary | ICD-10-CM | POA: Diagnosis not present

## 2019-07-10 ENCOUNTER — Ambulatory Visit: Payer: Federal, State, Local not specified - PPO | Admitting: Orthopaedic Surgery

## 2019-07-11 ENCOUNTER — Other Ambulatory Visit: Payer: Self-pay | Admitting: Neurology

## 2019-07-17 DIAGNOSIS — H2513 Age-related nuclear cataract, bilateral: Secondary | ICD-10-CM | POA: Diagnosis not present

## 2019-07-17 DIAGNOSIS — H43812 Vitreous degeneration, left eye: Secondary | ICD-10-CM | POA: Diagnosis not present

## 2019-07-28 NOTE — Progress Notes (Signed)
Received Epic notification that pt has not read MyChart message regarding results.     Recent lab results  From  Fonnie Mu, CMA To  Nechelle Perrins and Delivered  06/30/2019 3:54 PM  Ms. Christine Frank has reviewed your recent lab results and asked that I inform you that your thyroid test and Vitamin D levels were both normal.   Your HDL (good cholesterol) is 49 mg/dL (normal is greater than 40), total cholesterol is 178 mg/dL (normal is less than 200) and LDL (bad cholesterol) is 94, which is improved from 118 last year. However, your A1c (3 month average of blood sugars) is 5.6, which is up from 5.4 last year. Also, your complete blood count showed that your platelets are elevated at 529, which is slightly improved from last year at 563.   Wishing you well,  Christine Frank, CMA for  Christine Marble, NP        [address]  MyChart User Last Read On  Christine Frank Not Read       Letter mailed to pt with results.  Christine Frank, CMA

## 2019-07-30 ENCOUNTER — Encounter: Payer: Self-pay | Admitting: Orthopaedic Surgery

## 2019-07-30 ENCOUNTER — Ambulatory Visit (INDEPENDENT_AMBULATORY_CARE_PROVIDER_SITE_OTHER): Payer: Federal, State, Local not specified - PPO | Admitting: Orthopaedic Surgery

## 2019-07-30 ENCOUNTER — Ambulatory Visit (INDEPENDENT_AMBULATORY_CARE_PROVIDER_SITE_OTHER): Payer: Federal, State, Local not specified - PPO

## 2019-07-30 DIAGNOSIS — M25561 Pain in right knee: Secondary | ICD-10-CM

## 2019-07-30 DIAGNOSIS — G8929 Other chronic pain: Secondary | ICD-10-CM

## 2019-07-30 NOTE — Progress Notes (Signed)
Office Visit Note   Patient: Christine Frank           Date of Birth: [birth year]           MRN: [id number] Visit Date: 07/30/2019              Requested by: Esaw Grandchild, NP [place],  [place] [address] PCP: Esaw Grandchild, NP   Assessment & Plan: Visit Diagnoses:  1. Chronic pain of right knee     Plan: Impression is right knee patella chondromalacia.  We have given her referral to [facility] physical therapy for strengthening.  Compression knee sleeve was recommended.  She will continue use Voltaren gel and oral NSAIDs as needed.  Questions encouraged and answered.  Follow-up as needed.  Follow-Up Instructions: Return if symptoms worsen or fail to improve.   Orders:  Orders Placed This Encounter  Procedures  . XR KNEE 3 VIEW RIGHT   No orders of the defined types were placed in this encounter.     Procedures: No procedures performed   Clinical Data: No additional findings.   Subjective: Chief Complaint  Patient presents with  . Right Knee - Pain    Christine Frank is a 53 year old female comes in for evaluation of right knee pain for about a month.  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  She endorses anterior knee pain that is worse with bending and going up stairs.  She is in pain management chronically.  Occasionally she will have a sharp medial pain but she denies any mechanical symptoms.   Review of Systems  Constitutional: Negative.   HENT: Negative.   Eyes: Negative.   Respiratory: Negative.   Cardiovascular: Negative.   Endocrine: Negative.   Musculoskeletal: Negative.   Neurological: Negative.   Hematological: Negative.   Psychiatric/Behavioral: Negative.   All other systems reviewed and are negative.    Objective: Vital Signs: There were no vitals taken for this visit.  Physical Exam Vitals signs and nursing note reviewed.  Constitutional:      Appearance: She is well-developed.   Pulmonary:     Effort: Pulmonary effort is normal.  Skin:    General: Skin is warm.     Capillary Refill: Capillary refill takes less than 2 seconds.  Neurological:     Mental Status: She is alert and oriented to person, place, and time.  Psychiatric:        Behavior: Behavior normal.        Thought Content: Thought content normal.        Judgment: Judgment normal.     Ortho Exam Right knee exam shows no joint effusion.  Normal range of motion because and cruciates are stable.  No joint tenderness. Specialty Comments:  No specialty comments available.  Imaging: Xr Knee 3 View Right  Result Date: 07/30/2019 Mild spurring around the patella.    PMFS History: Patient Active Problem List   Diagnosis Date Noted  . Chronic migraine without aura without status migrainosus, not intractable 03/26/2019  . Dysuria 07/18/2018  . Depression 05/16/2018  . Grief 05/16/2018  . Vitamin D deficiency 11/08/2017  . Screening for cervical cancer 11/08/2017  . Healthcare maintenance 07/05/2017  . Menstrual migraine with status migrainosus, not intractable 07/05/2017  . Back pain of lumbar region with sciatica 07/05/2017  . Varicose veins of lower extremities with other complications [id number]   Past Medical History:  Diagnosis Date  . Allergy   .  Anemia   . Chronic hip pain   . Chronic leg pain    bilateral  . Depression   . Migraine   . PMDD (premenstrual dysphoric disorder)   . Restless leg   . S/P epidural steroid injection    affects legs is seen at pain clinic.    Family History  Problem Relation Age of Onset  . Hypertension Father   . Diabetes Father   . Hyperlipidemia Father   . Alcohol abuse Maternal Grandmother   . Alcohol abuse Maternal Grandfather   . Heart attack Paternal Grandfather   . Cerebral aneurysm Mother   . Cerebral aneurysm Maternal Aunt   . Migraines Maternal Aunt   . Colon cancer Neg Hx   . Esophageal cancer Neg Hx   . Rectal cancer Neg Hx   .  Stomach cancer Neg Hx     Past Surgical History:  Procedure Laterality Date  . CERVICAL CERCLAGE    . CESAREAN SECTION     x 2  . CESAREAN SECTION  1994  . [place]   Social History   Occupational History  . Not on file  Tobacco Use  . Smoking status: Never Smoker  . Smokeless tobacco: Never Used  . Tobacco comment: tried tobacco a few times as a teen, never picked up the habit  Substance and Sexual Activity  . Alcohol use: Yes    Alcohol/week: 0.0 standard drinks    Comment: Rare; 2-3 times per year socially  . Drug use: No  . Sexual activity: Yes    Birth control/protection: None    Comment: 1st intercourse 17 yo-5 partners

## 2019-08-07 ENCOUNTER — Telehealth: Payer: Self-pay | Admitting: Orthopaedic Surgery

## 2019-08-07 DIAGNOSIS — Z79899 Other long term (current) drug therapy: Secondary | ICD-10-CM | POA: Diagnosis not present

## 2019-08-07 DIAGNOSIS — Z79891 Long term (current) use of opiate analgesic: Secondary | ICD-10-CM | POA: Diagnosis not present

## 2019-08-07 DIAGNOSIS — M47816 Spondylosis without myelopathy or radiculopathy, lumbar region: Secondary | ICD-10-CM | POA: Diagnosis not present

## 2019-08-07 DIAGNOSIS — G894 Chronic pain syndrome: Secondary | ICD-10-CM | POA: Diagnosis not present

## 2019-08-07 DIAGNOSIS — M7060 Trochanteric bursitis, unspecified hip: Secondary | ICD-10-CM | POA: Diagnosis not present

## 2019-08-07 DIAGNOSIS — M25569 Pain in unspecified knee: Secondary | ICD-10-CM | POA: Diagnosis not present

## 2019-08-07 NOTE — Telephone Encounter (Signed)
07/30/19 ov note faxed to [facility] [phone]

## 2019-08-08 ENCOUNTER — Other Ambulatory Visit: Payer: Self-pay | Admitting: Neurology

## 2019-08-09 ENCOUNTER — Other Ambulatory Visit: Payer: Self-pay | Admitting: Adult Health

## 2019-08-18 NOTE — Progress Notes (Deleted)
Subjective:    Patient ID: Christine Frank, female    DOB: [birth year], 53 y.o.   MRN: [id number]  HPI:12/19/2018 OV:  MS. Rentas presents for f/u on mood She was started on Fluoxetine 20mg  in June 2019 to help with increased depression in response to her husband's suicide. 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 She again declined CBT referral She has recently resumed regular exercise- stationary bike and treadmill for 1 hr 3-4 times/week Recent changes to pain control regime- she reports reduction in daily pain levels, denies sedation issues She denies thoughts of harming herself/others  08/19/2019 OV: [name] is here for CPE  06/17/2019 Labs- Vit D- WNL, 36.3  TSH-WNL-1.010  The 10-year ASCVD risk score Mikey Bussing DC Jr., et al., 2013) is: 1.8%  Values used to calculate the score:   Age: 13 years   Sex: Female   Is Non-Hispanic African American: No   Diabetic: No   Tobacco smoker: No   Systolic Blood Pressure: Q000111Q mmHg   Is BP treated: No   HDL Cholesterol: 49 mg/dL   Total Cholesterol: 178 mg/dL  LDL-94, Improved from 118 last year  A1c-5.6, up from 5.4 last year  CMP- K+ 3.4  CBC-Plt- elevated 529, however slightly improved from last yr -[place] Maintenance: PAP- Mammogram- Colonoscopy- Immunizations-  Patient Care Team    Relationship Specialty Notifications Start End  [place], Valetta Fuller D, NP PCP - General Family Medicine  12/07/16   Meredith Pel, MD Consulting Physician Orthopedic Surgery  12/07/16   Renie Ora, MD Referring Physician Anesthesiology  12/07/16   Anastasio Auerbach, MD Consulting Physician Gynecology  12/07/16     Patient Active Problem List   Diagnosis Date Noted  . Chronic  migraine without aura without status migrainosus, not intractable 03/26/2019  . Dysuria 07/18/2018  . Depression 05/16/2018  . Grief 05/16/2018  . Vitamin D deficiency 11/08/2017  . Screening for cervical cancer 11/08/2017  . Healthcare maintenance 07/05/2017  . Menstrual migraine with status migrainosus, not intractable 07/05/2017  . Back pain of lumbar region with sciatica 07/05/2017  . Varicose veins of lower extremities with other complications [id number]     Past Medical History:  Diagnosis Date  . Allergy   . Anemia   . Chronic hip pain   . Chronic leg pain    bilateral  . Depression   . Migraine   . PMDD (premenstrual dysphoric disorder)   . Restless leg   . S/P epidural steroid injection    affects legs is seen at pain clinic.     Past Surgical History:  Procedure Laterality Date  . CERVICAL CERCLAGE    . CESAREAN SECTION     x 2  . CESAREAN SECTION  1994  . CESAREAN SECTION  1999     Family History  Problem Relation Age of Onset  . Hypertension Father   . Diabetes Father   . Hyperlipidemia Father   . Alcohol abuse Maternal Grandmother   . Alcohol abuse Maternal Grandfather   . Heart attack Paternal Grandfather   . Cerebral aneurysm Mother   . Cerebral aneurysm Maternal Aunt   . Migraines Maternal Aunt   . Colon cancer Neg Hx   . Esophageal cancer Neg Hx   .  Rectal cancer Neg Hx   . Stomach cancer Neg Hx      Social History   Substance and Sexual Activity  Drug Use No     Social History   Substance and Sexual Activity  Alcohol Use Yes  . Alcohol/week: 0.0 standard drinks   Comment: Rare; 2-3 times per year socially     Social History   Tobacco Use  Smoking Status Never Smoker  Smokeless Tobacco Never Used  Tobacco Comment   tried tobacco a few times as a teen, never picked up the habit     Outpatient Encounter Medications as of 08/19/2019  Medication Sig Note  . baclofen (LIORESAL) 10 MG tablet Take 1 tablet (10 mg total) by  mouth 2 (two) times daily. Take 1/2 to 1 tablet 2 x day if needed for muscle spasm 03/25/2019: Pt reports she might take one every other day  . BIOTIN PO 10,000 mcg oral once daily   . butalbital-aspirin-caffeine (FIORINAL) 50-325-40 MG capsule TAKE 1 CAPSULE BY MOUTH EVERY 6 HOURS AS NEEDED FOR HEADACHE   . cetirizine (ZYRTEC) 10 MG tablet TAKE 1 TABLET DAILY FOR ALLERGIES   . CRANBERRY PO Take by mouth daily. With vitamin C   . diclofenac sodium (VOLTAREN) 1 % GEL Apply 1 application topically 3 (three) times daily as needed.   . docusate sodium (COLACE) 100 MG capsule Take 100 mg by mouth 2 (two) times daily.   . DULoxetine (CYMBALTA) 60 MG capsule TAKE 1 CAPSULE BY MOUTH EVERY DAY   . Esomeprazole Magnesium (NEXIUM PO) Take 1 tablet by mouth daily.   . fluconazole (DIFLUCAN) 150 MG tablet Take 1 tablet (150 mg total) by mouth every three (3) days as needed.   Marland Kitchen FLUoxetine (PROZAC) 20 MG capsule TAKE 1 CAPSULE BY MOUTH EVERY DAY   . fluticasone (FLONASE) 50 MCG/ACT nasal spray INHALE 1-2 SPRAYS IN EACH NOSTRIL TWICE DAILY   . gabapentin (NEURONTIN) 300 MG capsule TAKE 1 CAPSULE BY MOUTH THREE TIMES A DAY   . HYDROcodone-acetaminophen (NORCO) 10-325 MG tablet Take 1 tablet by mouth 4 (four) times daily.  12/07/2016: Received from: External Pharmacy  . ibuprofen (ADVIL) 800 MG tablet Take 1 tablet by mouth at bedtime.    Marland Kitchen ipratropium (ATROVENT) 0.03 % nasal spray PLACE 2 SPRAYS INTO THE NOSE 3 (THREE) TIMES DAILY. 03/25/2019: Only as needed  . loperamide (IMODIUM) 2 MG capsule Take 1 capsule (2 mg total) by mouth 4 (four) times daily as needed for diarrhea or loose stools.   . magnesium gluconate (MAGONATE) 500 MG tablet Take 500 mg by mouth 2 (two) times daily.   Marland Kitchen morphine (MS CONTIN) 30 MG 12 hr tablet Take 1 tablet by mouth 2 (two) times daily.   . Multiple Vitamin (MULTIVITAMIN) capsule Take 1 capsule by mouth daily.   . phenazopyridine (PYRIDIUM) 200 MG tablet Take 1 tablet (200 mg total) by  mouth 3 (three) times daily with meals. Take for two days 03/25/2019: Takes as needed  . promethazine (PHENERGAN) 25 MG tablet Take 1 tablet (25 mg total) by mouth every 6 (six) hours as needed for nausea or vomiting.   . rizatriptan (MAXALT) 10 MG tablet MAY REPEAT IN 2 HOURS IF NEEDED    Facility-Administered Encounter Medications as of 08/19/2019  Medication  . 0.9 %  sodium chloride infusion    Allergies: Sulfa antibiotics  There is no height or weight on file to calculate BMI.  There were no vitals taken for this visit.  Review of Systems     Objective:   Physical Exam        Assessment & Plan:  No diagnosis found.  No problem-specific Assessment & Plan notes found for this encounter.    FOLLOW-UP:  No follow-ups on file.

## 2019-08-19 ENCOUNTER — Encounter: Payer: Federal, State, Local not specified - PPO | Admitting: Adult Health

## 2019-08-20 ENCOUNTER — Encounter: Payer: Self-pay | Admitting: Gynecology

## 2019-09-04 ENCOUNTER — Other Ambulatory Visit: Payer: Self-pay | Admitting: Adult Health

## 2019-09-04 DIAGNOSIS — M2241 Chondromalacia patellae, right knee: Secondary | ICD-10-CM | POA: Diagnosis not present

## 2019-09-04 DIAGNOSIS — M47816 Spondylosis without myelopathy or radiculopathy, lumbar region: Secondary | ICD-10-CM | POA: Diagnosis not present

## 2019-09-04 DIAGNOSIS — G894 Chronic pain syndrome: Secondary | ICD-10-CM | POA: Diagnosis not present

## 2019-09-04 DIAGNOSIS — M7060 Trochanteric bursitis, unspecified hip: Secondary | ICD-10-CM | POA: Diagnosis not present

## 2019-09-04 DIAGNOSIS — M25569 Pain in unspecified knee: Secondary | ICD-10-CM | POA: Diagnosis not present

## 2019-09-04 DIAGNOSIS — M94261 Chondromalacia, right knee: Secondary | ICD-10-CM | POA: Diagnosis not present

## 2019-09-10 ENCOUNTER — Other Ambulatory Visit (HOSPITAL_COMMUNITY)
Admission: RE | Admit: 2019-09-10 | Discharge: 2019-09-10 | Disposition: A | Discharge status: Home / Self Care | Payer: Federal, State, Local not specified - PPO | Source: Ambulatory Visit | Attending: Adult Health | Admitting: Adult Health

## 2019-09-10 ENCOUNTER — Encounter: Payer: Self-pay | Admitting: Adult Health

## 2019-09-10 ENCOUNTER — Ambulatory Visit (INDEPENDENT_AMBULATORY_CARE_PROVIDER_SITE_OTHER): Payer: Federal, State, Local not specified - PPO | Admitting: Adult Health

## 2019-09-10 ENCOUNTER — Other Ambulatory Visit: Payer: Self-pay

## 2019-09-10 VITALS — BP 125/81 | HR 102 | Temp 98.3°F | Ht 63.5 in | Wt 185.8 lb

## 2019-09-10 DIAGNOSIS — R739 Hyperglycemia, unspecified: Secondary | ICD-10-CM | POA: Diagnosis not present

## 2019-09-10 DIAGNOSIS — Z23 Encounter for immunization: Secondary | ICD-10-CM

## 2019-09-10 DIAGNOSIS — Z124 Encounter for screening for malignant neoplasm of cervix: Secondary | ICD-10-CM

## 2019-09-10 DIAGNOSIS — F32A Depression, unspecified: Secondary | ICD-10-CM

## 2019-09-10 DIAGNOSIS — F329 Major depressive disorder, single episode, unspecified: Secondary | ICD-10-CM | POA: Diagnosis not present

## 2019-09-10 DIAGNOSIS — Z0000 Encounter for general adult medical examination without abnormal findings: Secondary | ICD-10-CM

## 2019-09-10 LAB — POCT GLYCOSYLATED HEMOGLOBIN (HGB A1C): Hemoglobin A1C: 5.6 % (ref 4.0–5.6)

## 2019-09-10 NOTE — Assessment & Plan Note (Signed)
Mood stable, denies SI/HI  Continue Duloxetine 60mg  QD

## 2019-09-10 NOTE — Patient Instructions (Addendum)
Preventive Care for Adults, Female  A healthy lifestyle and preventive care can promote health and wellness. Preventive health guidelines for women include the following key practices.   A routine yearly physical is a good way to check with your health care provider about your health and preventive screening. It is a chance to share any concerns and updates on your health and to receive a thorough exam.   Visit your dentist for a routine exam and preventive care every 6 months. Brush your teeth twice a day and floss once a day. Good oral hygiene prevents tooth decay and gum disease.   The frequency of eye exams is based on your age, health, family medical history, use of contact lenses, and other factors. Follow your health care provider's recommendations for frequency of eye exams.   Eat a healthy diet. Foods like vegetables, fruits, whole grains, low-fat dairy products, and lean protein foods contain the nutrients you need without too many calories. Decrease your intake of foods high in solid fats, added sugars, and salt. Eat the right amount of calories for you.Get information about a proper diet from your health care provider, if necessary.   Regular physical exercise is one of the most important things you can do for your health. Most adults should get at least 150 minutes of moderate-intensity exercise (any activity that increases your heart rate and causes you to sweat) each week. In addition, most adults need muscle-strengthening exercises on 2 or more days a week.   Maintain a healthy weight. The body mass index (BMI) is a screening tool to identify possible weight problems. It provides an estimate of body fat based on height and weight. Your health care provider can find your BMI, and can help you achieve or maintain a healthy weight.For adults 20 years and older:   - A BMI below 18.5 is considered underweight.   - A BMI of 18.5 to 24.9 is normal.   - A BMI of 25 to 29.9 is  considered overweight.   - A BMI of 30 and above is considered obese.   Maintain normal blood lipids and cholesterol levels by exercising and minimizing your intake of trans and saturated fats.  Eat a balanced diet with plenty of fruit and vegetables. Blood tests for lipids and cholesterol should begin at age 20 and be repeated every 5 years minimum.  If your lipid or cholesterol levels are high, you are over 40, or you are at high risk for heart disease, you may need your cholesterol levels checked more frequently.Ongoing high lipid and cholesterol levels should be treated with medicines if diet and exercise are not working.   If you smoke, find out from your health care provider how to quit. If you do not use tobacco, do not start.   Lung cancer screening is recommended for adults aged 55-80 years who are at high risk for developing lung cancer because of a history of smoking. A yearly low-dose CT scan of the lungs is recommended for people who have at least a 30-pack-year history of smoking and are a current smoker or have quit within the past 15 years. A pack year of smoking is smoking an average of 1 pack of cigarettes a day for 1 year (for example: 1 pack a day for 30 years or 2 packs a day for 15 years). Yearly screening should continue until the smoker has stopped smoking for at least 15 years. Yearly screening should be stopped for people who develop a   health problem that would prevent them from having lung cancer treatment.   If you are pregnant, do not drink alcohol. If you are breastfeeding, be very cautious about drinking alcohol. If you are not pregnant and choose to drink alcohol, do not have more than 1 drink per day. One drink is considered to be 12 ounces (355 mL) of beer, 5 ounces (148 mL) of wine, or 1.5 ounces (44 mL) of liquor.   Avoid use of street drugs. Do not share needles with anyone. Ask for help if you need support or instructions about stopping the use of  drugs.   High blood pressure causes heart disease and increases the risk of stroke. Your blood pressure should be checked at least yearly.  Ongoing high blood pressure should be treated with medicines if weight loss and exercise do not work.   If you are 69-55 years old, ask your health care provider if you should take aspirin to prevent strokes.   Diabetes screening involves taking a blood sample to check your fasting blood sugar level. This should be done once every 3 years, after age 38, if you are within normal weight and without risk factors for diabetes. Testing should be considered at a younger age or be carried out more frequently if you are overweight and have at least 1 risk factor for diabetes.   Breast cancer screening is essential preventive care for women. You should practice "breast self-awareness."  This means understanding the normal appearance and feel of your breasts and may include breast self-examination.  Any changes detected, no matter how small, should be reported to a health care provider.  Women in their 80s and 30s should have a clinical breast exam (CBE) by a health care provider as part of a regular health exam every 1 to 3 years.  After age 66, women should have a CBE every year.  Starting at age 1, women should consider having a mammogram (breast X-ray test) every year.  Women who have a family history of breast cancer should talk to their health care provider about genetic screening.  Women at a high risk of breast cancer should talk to their health care providers about having an MRI and a mammogram every year.   -Breast cancer gene (BRCA)-related cancer risk assessment is recommended for women who have family members with BRCA-related cancers. BRCA-related cancers include breast, ovarian, tubal, and peritoneal cancers. Having family members with these cancers may be associated with an increased risk for harmful changes (mutations) in the breast cancer genes BRCA1 and  BRCA2. Results of the assessment will determine the need for genetic counseling and BRCA1 and BRCA2 testing.   The Pap test is a screening test for cervical cancer. A Pap test can show cell changes on the cervix that might become cervical cancer if left untreated. A Pap test is a procedure in which cells are obtained and examined from the lower end of the uterus (cervix).   - Women should have a Pap test starting at age 57.   - Between ages 90 and 70, Pap tests should be repeated every 2 years.   - Beginning at age 63, you should have a Pap test every 3 years as long as the past 3 Pap tests have been normal.   - Some women have medical problems that increase the chance of getting cervical cancer. Talk to your health care provider about these problems. It is especially important to talk to your health care provider if a  new problem develops soon after your last Pap test. In these cases, your health care provider may recommend more frequent screening and Pap tests.   - The above recommendations are the same for women who have or have not gotten the vaccine for human papillomavirus (HPV).   - If you had a hysterectomy for a problem that was not cancer or a condition that could lead to cancer, then you no longer need Pap tests. Even if you no longer need a Pap test, a regular exam is a good idea to make sure no other problems are starting.   - If you are between ages 36 and 66 years, and you have had normal Pap tests going back 10 years, you no longer need Pap tests. Even if you no longer need a Pap test, a regular exam is a good idea to make sure no other problems are starting.   - If you have had past treatment for cervical cancer or a condition that could lead to cancer, you need Pap tests and screening for cancer for at least 20 years after your treatment.   - If Pap tests have been discontinued, risk factors (such as a new sexual partner) need to be reassessed to determine if screening should  be resumed.   - The HPV test is an additional test that may be used for cervical cancer screening. The HPV test looks for the virus that can cause the cell changes on the cervix. The cells collected during the Pap test can be tested for HPV. The HPV test could be used to screen women aged 70 years and older, and should be used in women of any age who have unclear Pap test results. After the age of 67, women should have HPV testing at the same frequency as a Pap test.   Colorectal cancer can be detected and often prevented. Most routine colorectal cancer screening begins at the age of 57 years and continues through age 26 years. However, your health care provider may recommend screening at an earlier age if you have risk factors for colon cancer. On a yearly basis, your health care provider may provide home test kits to check for hidden blood in the stool.  Use of a small camera at the end of a tube, to directly examine the colon (sigmoidoscopy or colonoscopy), can detect the earliest forms of colorectal cancer. Talk to your health care provider about this at age 23, when routine screening begins. Direct exam of the colon should be repeated every 5 -10 years through age 49 years, unless early forms of pre-cancerous polyps or small growths are found.   People who are at an increased risk for hepatitis B should be screened for this virus. You are considered at high risk for hepatitis B if:  -You were born in a country where hepatitis B occurs often. Talk with your health care provider about which countries are considered high risk.  - Your parents were born in a high-risk country and you have not received a shot to protect against hepatitis B (hepatitis B vaccine).  - You have HIV or AIDS.  - You use needles to inject street drugs.  - You live with, or have sex with, someone who has Hepatitis B.  - You get hemodialysis treatment.  - You take certain medicines for conditions like cancer, organ  transplantation, and autoimmune conditions.   Hepatitis C blood testing is recommended for all people born from [birth year] through 1965 and any individual  with known risks for hepatitis C.   Practice safe sex. Use condoms and avoid high-risk sexual practices to reduce the spread of sexually transmitted infections (STIs). STIs include gonorrhea, chlamydia, syphilis, trichomonas, herpes, HPV, and human immunodeficiency virus (HIV). Herpes, HIV, and HPV are viral illnesses that have no cure. They can result in disability, cancer, and death. Sexually active women aged 25 years and younger should be checked for chlamydia. Older women with new or multiple partners should also be tested for chlamydia. Testing for other STIs is recommended if you are sexually active and at increased risk.   Osteoporosis is a disease in which the bones lose minerals and strength with aging. This can result in serious bone fractures or breaks. The risk of osteoporosis can be identified using a bone density scan. Women ages 65 years and over and women at risk for fractures or osteoporosis should discuss screening with their health care providers. Ask your health care provider whether you should take a calcium supplement or vitamin D to There are also several preventive steps women can take to avoid osteoporosis and resulting fractures or to keep osteoporosis from worsening. -->Recommendations include:  Eat a balanced diet high in fruits, vegetables, calcium, and vitamins.  Get enough calcium. The recommended total intake of is 1,200 mg daily; for best absorption, if taking supplements, divide doses into 250-500 mg doses throughout the day. Of the two types of calcium, calcium carbonate is best absorbed when taken with food but calcium citrate can be taken on an empty stomach.  Get enough vitamin D. NAMS and the National Osteoporosis Foundation recommend at least 1,000 IU per day for women age 50 and over who are at risk of vitamin D  deficiency. Vitamin D deficiency can be caused by inadequate sun exposure (for example, those who live in northern latitudes).  Avoid alcohol and smoking. Heavy alcohol intake (more than 7 drinks per week) increases the risk of falls and hip fracture and women smokers tend to lose bone more rapidly and have lower bone mass than nonsmokers. Stopping smoking is one of the most important changes women can make to improve their health and decrease risk for disease.  Be physically active every day. Weight-bearing exercise (for example, fast walking, hiking, jogging, and weight training) may strengthen bones or slow the rate of bone loss that comes with aging. Balancing and muscle-strengthening exercises can reduce the risk of falling and fracture.  Consider therapeutic medications. Currently, several types of effective drugs are available. Healthcare providers can recommend the type most appropriate for each woman.  Eliminate environmental factors that may contribute to accidents. Falls cause nearly 90% of all osteoporotic fractures, so reducing this risk is an important bone-health strategy. Measures include ample lighting, removing obstructions to walking, using nonskid rugs on floors, and placing mats and/or grab bars in showers.  Be aware of medication side effects. Some common medicines make bones weaker. These include a type of steroid drug called glucocorticoids used for arthritis and asthma, some antiseizure drugs, certain sleeping pills, treatments for endometriosis, and some cancer drugs. An overactive thyroid gland or using too much thyroid hormone for an underactive thyroid can also be a problem. If you are taking these medicines, talk to your doctor about what you can do to help protect your bones.reduce the rate of osteoporosis.    Menopause can be associated with physical symptoms and risks. Hormone replacement therapy is available to decrease symptoms and risks. You should talk to your  health care provider   about whether hormone replacement therapy is right for you.   Use sunscreen. Apply sunscreen liberally and repeatedly throughout the day. You should seek shade when your shadow is shorter than you. Protect yourself by wearing long sleeves, pants, a wide-brimmed hat, and sunglasses year round, whenever you are outdoors.   Once a month, do a whole body skin exam, using a mirror to look at the skin on your back. Tell your health care provider of new moles, moles that have irregular borders, moles that are larger than a pencil eraser, or moles that have changed in shape or color.   -Stay current with required vaccines (immunizations).   Influenza vaccine. All adults should be immunized every year.  Tetanus, diphtheria, and acellular pertussis (Td, Tdap) vaccine. Pregnant women should receive 1 dose of Tdap vaccine during each pregnancy. The dose should be obtained regardless of the length of time since the last dose. Immunization is preferred during the 27th 36th week of gestation. An adult who has not previously received Tdap or who does not know her vaccine status should receive 1 dose of Tdap. This initial dose should be followed by tetanus and diphtheria toxoids (Td) booster doses every 10 years. Adults with an unknown or incomplete history of completing a 3-dose immunization series with Td-containing vaccines should begin or complete a primary immunization series including a Tdap dose. Adults should receive a Td booster every 10 years.  Varicella vaccine. An adult without evidence of immunity to varicella should receive 2 doses or a second dose if she has previously received 1 dose. Pregnant females who do not have evidence of immunity should receive the first dose after pregnancy. This first dose should be obtained before leaving the health care facility. The second dose should be obtained 4 8 weeks after the first dose.  Human papillomavirus (HPV) vaccine. Females aged 13 26  years who have not received the vaccine previously should obtain the 3-dose series. The vaccine is not recommended for use in pregnant females. However, pregnancy testing is not needed before receiving a dose. If a female is found to be pregnant after receiving a dose, no treatment is needed. In that case, the remaining doses should be delayed until after the pregnancy. Immunization is recommended for any person with an immunocompromised condition through the age of 26 years if she did not get any or all doses earlier. During the 3-dose series, the second dose should be obtained 4 8 weeks after the first dose. The third dose should be obtained 24 weeks after the first dose and 16 weeks after the second dose.  Zoster vaccine. One dose is recommended for adults aged 60 years or older unless certain conditions are present.  Measles, mumps, and rubella (MMR) vaccine. Adults born before 1957 generally are considered immune to measles and mumps. Adults born in 1957 or later should have 1 or more doses of MMR vaccine unless there is a contraindication to the vaccine or there is laboratory evidence of immunity to each of the three diseases. A routine second dose of MMR vaccine should be obtained at least 28 days after the first dose for students attending postsecondary schools, health care workers, or international travelers. People who received inactivated measles vaccine or an unknown type of measles vaccine during 1963 1967 should receive 2 doses of MMR vaccine. People who received inactivated mumps vaccine or an unknown type of mumps vaccine before 1979 and are at high risk for mumps infection should consider immunization with 2 doses of   MMR vaccine. For females of childbearing age, rubella immunity should be determined. If there is no evidence of immunity, females who are not pregnant should be vaccinated. If there is no evidence of immunity, females who are pregnant should delay immunization until after pregnancy.  Unvaccinated health care workers born before [birth year] who lack laboratory evidence of measles, mumps, or rubella immunity or laboratory confirmation of disease should consider measles and mumps immunization with 2 doses of MMR vaccine or rubella immunization with 1 dose of MMR vaccine.  Pneumococcal 13-valent conjugate (PCV13) vaccine. When indicated, a person who is uncertain of her immunization history and has no record of immunization should receive the PCV13 vaccine. An adult aged 54 years or older who has certain medical conditions and has not been previously immunized should receive 1 dose of PCV13 vaccine. This PCV13 should be followed with a dose of pneumococcal polysaccharide (PPSV23) vaccine. The PPSV23 vaccine dose should be obtained at least 8 weeks after the dose of PCV13 vaccine. An adult aged 58 years or older who has certain medical conditions and previously received 1 or more doses of PPSV23 vaccine should receive 1 dose of PCV13. The PCV13 vaccine dose should be obtained 1 or more years after the last PPSV23 vaccine dose.  Pneumococcal polysaccharide (PPSV23) vaccine. When PCV13 is also indicated, PCV13 should be obtained first. All adults aged 58 years and older should be immunized. An adult younger than age 65 years who has certain medical conditions should be immunized. Any person who resides in a nursing home or long-term care facility should be immunized. An adult smoker should be immunized. People with an immunocompromised condition and certain other conditions should receive both PCV13 and PPSV23 vaccines. People with human immunodeficiency virus (HIV) infection should be immunized as soon as possible after diagnosis. Immunization during chemotherapy or radiation therapy should be avoided. Routine use of PPSV23 vaccine is not recommended for American Indians, [place] Natives, or people younger than 65 years unless there are medical conditions that require PPSV23 vaccine. When indicated,  people who have unknown immunization and have no record of immunization should receive PPSV23 vaccine. One-time revaccination 5 years after the first dose of PPSV23 is recommended for people aged 70 64 years who have chronic kidney failure, nephrotic syndrome, asplenia, or immunocompromised conditions. People who received 1 2 doses of PPSV23 before age 32 years should receive another dose of PPSV23 vaccine at age 96 years or later if at least 5 years have passed since the previous dose. Doses of PPSV23 are not needed for people immunized with PPSV23 at or after age 55 years.  Meningococcal vaccine. Adults with asplenia or persistent complement component deficiencies should receive 2 doses of quadrivalent meningococcal conjugate (MenACWY-D) vaccine. The doses should be obtained at least 2 months apart. Microbiologists working with certain meningococcal bacteria, [employer] recruits, people at risk during an outbreak, and people who travel to or live in countries with a high rate of meningitis should be immunized. A first-year college student up through age 58 years who is living in a residence hall should receive a dose if she did not receive a dose on or after her 16th birthday. Adults who have certain high-risk conditions should receive one or more doses of vaccine.  Hepatitis A vaccine. Adults who wish to be protected from this disease, have certain high-risk conditions, work with hepatitis A-infected animals, work in hepatitis A research labs, or travel to or work in countries with a high rate of hepatitis A should be  immunized. Adults who were previously unvaccinated and who anticipate close contact with an international adoptee during the first 60 days after arrival in the [place] from a country with a high rate of hepatitis A should be immunized.  Hepatitis B vaccine.  Adults who wish to be protected from this disease, have certain high-risk conditions, may be exposed to blood or other infectious  body fluids, are household contacts or sex partners of hepatitis B positive people, are clients or workers in certain care facilities, or travel to or work in countries with a high rate of hepatitis B should be immunized.  Haemophilus influenzae type b (Hib) vaccine. A previously unvaccinated person with asplenia or sickle cell disease or having a scheduled splenectomy should receive 1 dose of Hib vaccine. Regardless of previous immunization, a recipient of a hematopoietic stem cell transplant should receive a 3-dose series 6 12 months after her successful transplant. Hib vaccine is not recommended for adults with HIV infection.  Preventive Services / Frequency Ages 6 to 39years  Blood pressure check.** / Every 1 to 2 years.  Lipid and cholesterol check.** / Every 5 years beginning at age 39.  Clinical breast exam.** / Every 3 years for women in their 61s and 62s.  BRCA-related cancer risk assessment.** / For women who have family members with a BRCA-related cancer (breast, ovarian, tubal, or peritoneal cancers).  Pap test.** / Every 2 years from ages 47 through 85. Every 3 years starting at age 34 through age 12 or 74 with a history of 3 consecutive normal Pap tests.  HPV screening.** / Every 3 years from ages 46 through ages 43 to 54 with a history of 3 consecutive normal Pap tests.  Hepatitis C blood test.** / For any individual with known risks for hepatitis C.  Skin self-exam. / Monthly.  Influenza vaccine. / Every year.  Tetanus, diphtheria, and acellular pertussis (Tdap, Td) vaccine.** / Consult your health care provider. Pregnant women should receive 1 dose of Tdap vaccine during each pregnancy. 1 dose of Td every 10 years.  Varicella vaccine.** / Consult your health care provider. Pregnant females who do not have evidence of immunity should receive the first dose after pregnancy.  HPV vaccine. / 3 doses over 6 months, if 64 and younger. The vaccine is not recommended for use in  pregnant females. However, pregnancy testing is not needed before receiving a dose.  Measles, mumps, rubella (MMR) vaccine.** / You need at least 1 dose of MMR if you were born in 1957 or later. You may also need a 2nd dose. For females of childbearing age, rubella immunity should be determined. If there is no evidence of immunity, females who are not pregnant should be vaccinated. If there is no evidence of immunity, females who are pregnant should delay immunization until after pregnancy.  Pneumococcal 13-valent conjugate (PCV13) vaccine.** / Consult your health care provider.  Pneumococcal polysaccharide (PPSV23) vaccine.** / 1 to 2 doses if you smoke cigarettes or if you have certain conditions.  Meningococcal vaccine.** / 1 dose if you are age 71 to 37 years and a Market researcher living in a residence hall, or have one of several medical conditions, you need to get vaccinated against meningococcal disease. You may also need additional booster doses.  Hepatitis A vaccine.** / Consult your health care provider.  Hepatitis B vaccine.** / Consult your health care provider.  Haemophilus influenzae type b (Hib) vaccine.** / Consult your health care provider.  Ages 55 to 64years  Blood pressure check.** / Every 1 to 2 years.  Lipid and cholesterol check.** / Every 5 years beginning at age 20 years.  Lung cancer screening. / Every year if you are aged 55 80 years and have a 30-pack-year history of smoking and currently smoke or have quit within the past 15 years. Yearly screening is stopped once you have quit smoking for at least 15 years or develop a health problem that would prevent you from having lung cancer treatment.  Clinical breast exam.** / Every year after age 40 years.  BRCA-related cancer risk assessment.** / For women who have family members with a BRCA-related cancer (breast, ovarian, tubal, or peritoneal cancers).  Mammogram.** / Every year beginning at age 40  years and continuing for as long as you are in good health. Consult with your health care provider.  Pap test.** / Every 3 years starting at age 30 years through age 65 or 70 years with a history of 3 consecutive normal Pap tests.  HPV screening.** / Every 3 years from ages 30 years through ages 65 to 70 years with a history of 3 consecutive normal Pap tests.  Fecal occult blood test (FOBT) of stool. / Every year beginning at age 50 years and continuing until age 75 years. You may not need to do this test if you get a colonoscopy every 10 years.  Flexible sigmoidoscopy or colonoscopy.** / Every 5 years for a flexible sigmoidoscopy or every 10 years for a colonoscopy beginning at age 50 years and continuing until age 75 years.  Hepatitis C blood test.** / For all people born from 1945 through 1965 and any individual with known risks for hepatitis C.  Skin self-exam. / Monthly.  Influenza vaccine. / Every year.  Tetanus, diphtheria, and acellular pertussis (Tdap/Td) vaccine.** / Consult your health care provider. Pregnant women should receive 1 dose of Tdap vaccine during each pregnancy. 1 dose of Td every 10 years.  Varicella vaccine.** / Consult your health care provider. Pregnant females who do not have evidence of immunity should receive the first dose after pregnancy.  Zoster vaccine.** / 1 dose for adults aged 60 years or older.  Measles, mumps, rubella (MMR) vaccine.** / You need at least 1 dose of MMR if you were born in 1957 or later. You may also need a 2nd dose. For females of childbearing age, rubella immunity should be determined. If there is no evidence of immunity, females who are not pregnant should be vaccinated. If there is no evidence of immunity, females who are pregnant should delay immunization until after pregnancy.  Pneumococcal 13-valent conjugate (PCV13) vaccine.** / Consult your health care provider.  Pneumococcal polysaccharide (PPSV23) vaccine.** / 1 to 2 doses if  you smoke cigarettes or if you have certain conditions.  Meningococcal vaccine.** / Consult your health care provider.  Hepatitis A vaccine.** / Consult your health care provider.  Hepatitis B vaccine.** / Consult your health care provider.  Haemophilus influenzae type b (Hib) vaccine.** / Consult your health care provider.  Ages 65 years and over  Blood pressure check.** / Every 1 to 2 years.  Lipid and cholesterol check.** / Every 5 years beginning at age 20 years.  Lung cancer screening. / Every year if you are aged 55 80 years and have a 30-pack-year history of smoking and currently smoke or have quit within the past 15 years. Yearly screening is stopped once you have quit smoking for at least 15 years or develop a health problem that   would prevent you from having lung cancer treatment.  Clinical breast exam.** / Every year after age 103 years.  BRCA-related cancer risk assessment.** / For women who have family members with a BRCA-related cancer (breast, ovarian, tubal, or peritoneal cancers).  Mammogram.** / Every year beginning at age 36 years and continuing for as long as you are in good health. Consult with your health care provider.  Pap test.** / Every 3 years starting at age 5 years through age 85 or 10 years with 3 consecutive normal Pap tests. Testing can be stopped between 65 and 70 years with 3 consecutive normal Pap tests and no abnormal Pap or HPV tests in the past 10 years.  HPV screening.** / Every 3 years from ages 93 years through ages 70 or 45 years with a history of 3 consecutive normal Pap tests. Testing can be stopped between 65 and 70 years with 3 consecutive normal Pap tests and no abnormal Pap or HPV tests in the past 10 years.  Fecal occult blood test (FOBT) of stool. / Every year beginning at age 8 years and continuing until age 45 years. You may not need to do this test if you get a colonoscopy every 10 years.  Flexible sigmoidoscopy or colonoscopy.** /  Every 5 years for a flexible sigmoidoscopy or every 10 years for a colonoscopy beginning at age 69 years and continuing until age 68 years.  Hepatitis C blood test.** / For all people born from [birth year] through 1965 and any individual with known risks for hepatitis C.  Osteoporosis screening.** / A one-time screening for women ages 7 years and over and women at risk for fractures or osteoporosis.  Skin self-exam. / Monthly.  Influenza vaccine. / Every year.  Tetanus, diphtheria, and acellular pertussis (Tdap/Td) vaccine.** / 1 dose of Td every 10 years.  Varicella vaccine.** / Consult your health care provider.  Zoster vaccine.** / 1 dose for adults aged 5 years or older.  Pneumococcal 13-valent conjugate (PCV13) vaccine.** / Consult your health care provider.  Pneumococcal polysaccharide (PPSV23) vaccine.** / 1 dose for all adults aged 74 years and older.  Meningococcal vaccine.** / Consult your health care provider.  Hepatitis A vaccine.** / Consult your health care provider.  Hepatitis B vaccine.** / Consult your health care provider.  Haemophilus influenzae type b (Hib) vaccine.** / Consult your health care provider. ** Family history and personal history of risk and conditions may change your health care provider's recommendations. Document Released: 01/09/2002 Document Revised: 09/03/2013  [facility] Patient Information 2014 [place], [place].   EXERCISE AND DIET:  We recommended that you start or continue a regular exercise program for good health. Regular exercise means any activity that makes your heart beat faster and makes you sweat.  We recommend exercising at least 30 minutes per day at least 3 days a week, preferably 5.  We also recommend a diet low in fat and sugar / carbohydrates.  Inactivity, poor dietary choices and obesity can cause diabetes, heart attack, stroke, and kidney damage, among others.     ALCOHOL AND SMOKING:  Women should limit their alcohol intake to no  more than 7 drinks/beers/glasses of wine (combined, not each!) per week. Moderation of alcohol intake to this level decreases your risk of breast cancer and liver damage.  ( And of course, no recreational drugs are part of a healthy lifestyle.)  Also, you should not be smoking at all or even being exposed to second hand smoke. Most people know smoking can  cause cancer, and various heart and lung diseases, but did you know it also contributes to weakening of your bones?  Aging of your skin?  Yellowing of your teeth and nails?   CALCIUM AND VITAMIN D:  Adequate intake of calcium and Vitamin D are recommended.  The recommendations for exact amounts of these supplements seem to change often, but generally speaking 600 mg of calcium (either carbonate or citrate) and 800 units of Vitamin D per day seems prudent. Certain women may benefit from higher intake of Vitamin D.  If you are among these women, your doctor will have told you during your visit.     PAP SMEARS:  Pap smears, to check for cervical cancer or precancers,  have traditionally been done yearly, although recent scientific advances have shown that most women can have pap smears less often.  However, every woman still should have a physical exam from her gynecologist or primary care physician every year. It will include a breast check, inspection of the vulva and vagina to check for abnormal growths or skin changes, a visual exam of the cervix, and then an exam to evaluate the size and shape of the uterus and ovaries.  And after 53 years of age, a rectal exam is indicated to check for rectal cancers. We will also provide age appropriate advice regarding health maintenance, like when you should have certain vaccines, screening for sexually transmitted diseases, bone density testing, colonoscopy, mammograms, etc.    MAMMOGRAMS:  All women over 54 years old should have a yearly mammogram. Many facilities now offer a "3D" mammogram, which may cost  around $50 extra out of pocket. If possible,  we recommend you accept the option to have the 3D mammogram performed.  It both reduces the number of women who will be called back for extra views which then turn out to be normal, and it is better than the routine mammogram at detecting truly abnormal areas.     COLONOSCOPY:  Colonoscopy to screen for colon cancer is recommended for all women at age 56.  We know, you hate the idea of the prep.  We agree, BUT, having colon cancer and not knowing it is worse!!  Colon cancer so often starts as a polyp that can be seen and removed at colonscopy, which can quite literally save your life!  And if your first colonoscopy is normal and you have no family history of colon cancer, most women don't have to have it again for 10 years.  Once every ten years, you can do something that may end up saving your life, right?  We will be happy to help you get it scheduled when you are ready.  Be sure to check your insurance coverage so you understand how much it will cost.  It may be covered as a preventative service at no cost, but you should check your particular policy.    Remain well hydrated, follow heart healthy diet. Remain as active as possible. A1c-5.6- normal, just under pre-diabetes. Continue to social distance and wear a mask when in public. Follow-up in 6 months, sooner if needed. So very sorry for your loss in the family! GREAT TO SEE YOU!

## 2019-09-10 NOTE — Addendum Note (Signed)
Addended by: Fonnie Mu on: 09/10/2019 02:17 PM   Modules accepted: Orders

## 2019-09-10 NOTE — Progress Notes (Signed)
Subjective:    Patient ID: Christine Frank, female    DOB: 11-Dec-1965, 53 y.o.   MRN: [id number]  HPI: 12/19/2018 OV: MS. Freye presents for f/u on mood She was started on Fluoxetine 20mg  in June 2019 to help with increased depression in response to her husband's suicide. 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 She again declined CBT referral She has recently resumed regular exercise- stationary bike and treadmill for 1 hr 3-4 times/week Recent changes to pain control regime- she reports reduction in daily pain levels, denies sedation issues She denies thoughts of harming herself/others  09/10/2019 OV:  [name] is here for CPE She reports mood stable, denies SI/HI  Continue Duloxetine 60mg  QD She is followed by pain mgt Q4weeks- feels that her pain is well controlled on  Norco 10mg  Q6H, MS Contin 30mg  Q12H. She denies constipation as long as she takes colace and drinks plenty of water. She recently completed long course of steroids and is concerned about elevated BG in July- will check A1c today. Her step children's mother passed away this morning from breast cancer, she was only 41  06/17/2019 Labs: Vit D- WNL, 36.3  TSH-WNL-1.010  The 10-year ASCVD risk score Christine Frank DC Jr., et al., 2013) is: 1.8%  Values used to calculate the score:   Age: 49 years   Sex: Female   Is Non-Hispanic African American: No   Diabetic: No   Tobacco smoker: No   Systolic Blood Pressure: Q000111Q mmHg   Is BP treated: No   HDL Cholesterol: 49 mg/dL   Total Cholesterol: 178 mg/dL  LDL-94, Improved from 118 last year  A1c-5.6, up from 5.4 last year  CMP- K+ 3.4  CBC-Plt- elevated 529, however slightly improved from last yr -[place]  Maintenance: PAP-Last 11/08/2017, updated today Mammogram-UTD,05/28/2019 Colonoscopy-02/08/2017, repeat 5-10 Immunizations-UTD  Patient Care Team    Relationship Specialty Notifications Start End  Mina Marble D, NP PCP - General Family Medicine  12/07/16   Meredith Pel, MD Consulting Physician Orthopedic Surgery  12/07/16   Renie Ora, MD Referring Physician Anesthesiology  12/07/16   Anastasio Auerbach, MD [place] Physician Gynecology  12/07/16     Patient Active Problem List   Diagnosis Date Noted  . Blood glucose elevated 09/10/2019  . Chronic migraine without aura without status migrainosus, not intractable 03/26/2019  . Dysuria 07/18/2018  . Depression 05/16/2018  . Grief 05/16/2018  . Vitamin D deficiency 11/08/2017  . Screening for cervical cancer 11/08/2017  . Healthcare maintenance 07/05/2017  . Menstrual migraine with status migrainosus, not intractable 07/05/2017  . Back pain of lumbar region with sciatica 07/05/2017  . Varicose veins of lower extremities with other complications [id number]     Past Medical History:  Diagnosis Date  . Allergy   . Anemia   . Chronic hip pain   . Chronic leg pain    bilateral  . Depression   . Migraine   . PMDD (premenstrual dysphoric disorder)   . Restless leg   . S/P epidural steroid injection    affects legs is seen at pain clinic.     Past Surgical History:  Procedure Laterality Date  . CERVICAL CERCLAGE    . CESAREAN SECTION  x 2  . CESAREAN SECTION  1994  . CESAREAN SECTION  1999     Family History  Problem Relation Age of Onset  . Hypertension Father   . Diabetes Father   . Hyperlipidemia Father   . Alcohol abuse Maternal Grandmother   . Alcohol abuse Maternal Grandfather   . Heart attack Paternal Grandfather   . Cerebral aneurysm Mother   . Cerebral aneurysm Maternal Aunt   . Migraines Maternal Aunt   . Colon cancer Neg Hx   . Esophageal cancer Neg Hx   . Rectal cancer Neg Hx   .  Stomach cancer Neg Hx      Social History   Substance and Sexual Activity  Drug Use No     Social History   Substance and Sexual Activity  Alcohol Use Yes  . Alcohol/week: 0.0 standard drinks   Comment: Rare; 2-3 times per year socially     Social History   Tobacco Use  Smoking Status Never Smoker  Smokeless Tobacco Never Used  Tobacco Comment   tried tobacco a few times as a teen, never picked up the habit     Outpatient Encounter Medications as of 09/10/2019  Medication Sig Note  . baclofen (LIORESAL) 10 MG tablet Take 1 tablet (10 mg total) by mouth 2 (two) times daily. Take 1/2 to 1 tablet 2 x day if needed for muscle spasm 03/25/2019: Pt reports she might take one every other day  . BIOTIN PO 10,000 mcg oral once daily   . butalbital-aspirin-caffeine (FIORINAL) 50-325-40 MG capsule TAKE 1 CAPSULE BY MOUTH EVERY 6 HOURS AS NEEDED FOR HEADACHE   . cetirizine (ZYRTEC) 10 MG tablet TAKE 1 TABLET DAILY FOR ALLERGIES   . CRANBERRY PO Take by mouth daily. With vitamin C   . diclofenac sodium (VOLTAREN) 1 % GEL Apply 1 application topically 3 (three) times daily as needed.   . docusate sodium (COLACE) 100 MG capsule Take 100 mg by mouth 2 (two) times daily.   . DULoxetine (CYMBALTA) 60 MG capsule TAKE 1 CAPSULE BY MOUTH EVERY DAY   . Esomeprazole Magnesium (NEXIUM PO) Take 1 tablet by mouth daily.   Marland Kitchen FLUoxetine (PROZAC) 20 MG capsule TAKE 1 CAPSULE BY MOUTH EVERY DAY   . fluticasone (FLONASE) 50 MCG/ACT nasal spray INHALE 1-2 SPRAYS IN EACH NOSTRIL TWICE DAILY   . gabapentin (NEURONTIN) 300 MG capsule TAKE 1 CAPSULE BY MOUTH THREE TIMES A DAY   . HYDROcodone-acetaminophen (NORCO) 10-325 MG tablet Take 1 tablet by mouth 4 (four) times daily.  12/07/2016: Received from: External Pharmacy  . ibuprofen (ADVIL) 800 MG tablet Take 1 tablet by mouth at bedtime.    Marland Kitchen ipratropium (ATROVENT) 0.03 % nasal spray PLACE 2 SPRAYS INTO THE NOSE 3 (THREE) TIMES DAILY. 03/25/2019: Only as  needed  . loperamide (IMODIUM) 2 MG capsule Take 1 capsule (2 mg total) by mouth 4 (four) times daily as needed for diarrhea or loose stools.   . magnesium gluconate (MAGONATE) 500 MG tablet Take 500 mg by mouth 2 (two) times daily.   Marland Kitchen morphine (MS CONTIN) 30 MG 12 hr tablet Take 1 tablet by mouth 2 (two) times daily.   . Multiple Vitamin (MULTIVITAMIN) capsule Take 1 capsule by mouth daily.   . phenazopyridine (PYRIDIUM) 200 MG tablet Take 1 tablet (200 mg total) by mouth 3 (three) times daily with meals. Take for two days 03/25/2019: Takes as needed  . promethazine (PHENERGAN) 25 MG tablet Take 1 tablet (25  mg total) by mouth every 6 (six) hours as needed for nausea or vomiting.   . rizatriptan (MAXALT) 10 MG tablet MAY REPEAT IN 2 HOURS IF NEEDED   . [DISCONTINUED] fluconazole (DIFLUCAN) 150 MG tablet Take 1 tablet (150 mg total) by mouth every three (3) days as needed.    Facility-Administered Encounter Medications as of 09/10/2019  Medication  . 0.9 %  sodium chloride infusion    Allergies: Sulfa antibiotics  Body mass index is 32.4 kg/m.  Blood pressure 125/81, pulse (!) 102, temperature 98.3 F (36.8 C), temperature source Oral, height 5' 3.5" (1.613 m), weight 185 lb 12.8 oz (84.3 kg), SpO2 98 %.  Review of Systems  Constitutional: Positive for fatigue. Negative for activity change, appetite change, chills, diaphoresis, fever and unexpected weight change.  Respiratory: Negative for cough, chest tightness, shortness of breath, wheezing and stridor.   Cardiovascular: Negative for chest pain, palpitations and leg swelling.  Gastrointestinal: Negative for abdominal distention, abdominal pain, blood in stool, constipation, diarrhea, nausea and vomiting.  Endocrine: Negative for cold intolerance, heat intolerance, polydipsia, polyphagia and polyuria.  Genitourinary: Negative for difficulty urinating, flank pain and hematuria.  Musculoskeletal: Positive for arthralgias, back pain,  gait problem and joint swelling.  Neurological: Negative for dizziness and headaches.  Hematological: Negative for adenopathy. Does not bruise/bleed easily.  Psychiatric/Behavioral: Negative for agitation, behavioral problems, confusion, decreased concentration, dysphoric mood, hallucinations, self-injury, sleep disturbance and suicidal ideas. The patient is not nervous/anxious and is not hyperactive.        Objective:   Physical Exam Vitals signs and nursing note reviewed. Exam conducted with a chaperone present.  Constitutional:      General: She is not in acute distress.    Appearance: Normal appearance. She is obese. She is not ill-appearing, toxic-appearing or diaphoretic.  HENT:     Head: Normocephalic and atraumatic.     Right Ear: Tympanic membrane, ear canal and external ear normal. There is no impacted cerumen.     Left Ear: Tympanic membrane, ear canal and external ear normal. There is no impacted cerumen.     Nose: Nose normal. No congestion.     Mouth/Throat:     Mouth: Mucous membranes are moist.     Pharynx: No oropharyngeal exudate.  Eyes:     Conjunctiva/sclera: Conjunctivae normal.     Pupils: Pupils are equal, round, and reactive to light.  Neck:     Musculoskeletal: Neck supple. No muscular tenderness.  Cardiovascular:     Rate and Rhythm: Tachycardia present.     Pulses: Normal pulses.     Heart sounds: Normal heart sounds. No murmur. No friction rub. No gallop.   Pulmonary:     Effort: Pulmonary effort is normal. No respiratory distress.     Breath sounds: Normal breath sounds. No stridor. No wheezing, rhonchi or rales.  Chest:     Chest wall: No tenderness.     Breasts:        Right: Normal. No inverted nipple, mass or nipple discharge.        Left: Normal. No inverted nipple, mass or nipple discharge.  Abdominal:     General: Abdomen is protuberant. Bowel sounds are normal.     Tenderness: There is no abdominal tenderness. There is no right CVA  tenderness or left CVA tenderness.  Genitourinary:    Vagina: Vaginal discharge present.     Comments: White vaginal discharge  Musculoskeletal: Normal range of motion.  Skin:    Capillary Refill: Capillary  refill takes less than 2 seconds.     Coloration: Skin is pale.  Neurological:     Mental Status: She is alert and oriented to person, place, and time.     Coordination: Coordination normal.  Psychiatric:        Mood and Affect: Mood normal.        Behavior: Behavior normal.        Thought Content: Thought content normal.        Judgment: Judgment normal.       Assessment & Plan:   1. Need for influenza vaccination   2. Blood glucose elevated   3. Healthcare maintenance   4. Depression, unspecified depression type     Healthcare maintenance Remain well hydrated, follow heart healthy diet. Remain as active as possible. A1c-5.6- normal, just under pre-diabetes. Continue to social distance and wear a mask when in public. Follow-up in 6 months, sooner if needed.  Depression Mood stable, denies SI/HI  Continue Duloxetine 60mg  QD   Blood glucose elevated Lab Results  Component Value Date   HGBA1C 5.6 09/10/2019   HGBA1C 5.6 06/17/2019   HGBA1C 5.4 01/30/2018  CMP 06/16/2018 BG 161    FOLLOW-UP:  Return in about 6 months (around 03/10/2020) for Regular Follow Up.

## 2019-09-10 NOTE — Assessment & Plan Note (Signed)
Lab Results  Component Value Date   HGBA1C 5.6 09/10/2019   HGBA1C 5.6 06/17/2019   HGBA1C 5.4 01/30/2018  CMP 06/16/2018 BG 161

## 2019-09-10 NOTE — Assessment & Plan Note (Signed)
Remain well hydrated, follow heart healthy diet. Remain as active as possible. A1c-5.6- normal, just under pre-diabetes. Continue to social distance and wear a mask when in public. Follow-up in 6 months, sooner if needed.

## 2019-09-14 ENCOUNTER — Other Ambulatory Visit: Payer: Self-pay | Admitting: Neurology

## 2019-09-17 ENCOUNTER — Encounter: Payer: Self-pay | Admitting: Adult Health

## 2019-09-17 LAB — CYTOLOGY - PAP
Comment: NEGATIVE
Diagnosis: NEGATIVE
High risk HPV: NEGATIVE

## 2019-10-02 DIAGNOSIS — M7060 Trochanteric bursitis, unspecified hip: Secondary | ICD-10-CM | POA: Diagnosis not present

## 2019-10-02 DIAGNOSIS — M2241 Chondromalacia patellae, right knee: Secondary | ICD-10-CM | POA: Diagnosis not present

## 2019-10-02 DIAGNOSIS — G894 Chronic pain syndrome: Secondary | ICD-10-CM | POA: Diagnosis not present

## 2019-10-02 DIAGNOSIS — M47816 Spondylosis without myelopathy or radiculopathy, lumbar region: Secondary | ICD-10-CM | POA: Diagnosis not present

## 2019-10-06 DIAGNOSIS — M1711 Unilateral primary osteoarthritis, right knee: Secondary | ICD-10-CM | POA: Diagnosis not present

## 2019-10-06 DIAGNOSIS — M25561 Pain in right knee: Secondary | ICD-10-CM | POA: Diagnosis not present

## 2019-10-22 ENCOUNTER — Other Ambulatory Visit: Payer: Self-pay | Admitting: Physician Assistant

## 2019-10-22 DIAGNOSIS — M25561 Pain in right knee: Secondary | ICD-10-CM

## 2019-10-30 DIAGNOSIS — Z79899 Other long term (current) drug therapy: Secondary | ICD-10-CM | POA: Diagnosis not present

## 2019-10-30 DIAGNOSIS — M7061 Trochanteric bursitis, right hip: Secondary | ICD-10-CM | POA: Diagnosis not present

## 2019-10-30 DIAGNOSIS — M5136 Other intervertebral disc degeneration, lumbar region: Secondary | ICD-10-CM | POA: Diagnosis not present

## 2019-10-30 DIAGNOSIS — Z79891 Long term (current) use of opiate analgesic: Secondary | ICD-10-CM | POA: Diagnosis not present

## 2019-10-30 DIAGNOSIS — M1711 Unilateral primary osteoarthritis, right knee: Secondary | ICD-10-CM | POA: Diagnosis not present

## 2019-10-30 DIAGNOSIS — G894 Chronic pain syndrome: Secondary | ICD-10-CM | POA: Diagnosis not present

## 2019-11-08 ENCOUNTER — Ambulatory Visit
Admission: RE | Admit: 2019-11-08 | Discharge: 2019-11-08 | Disposition: A | Discharge status: Home / Self Care | Payer: Federal, State, Local not specified - PPO | Source: Ambulatory Visit | Attending: Physician Assistant | Admitting: Physician Assistant

## 2019-11-08 ENCOUNTER — Other Ambulatory Visit: Payer: Self-pay

## 2019-11-08 DIAGNOSIS — M25561 Pain in right knee: Secondary | ICD-10-CM | POA: Diagnosis not present

## 2019-11-09 ENCOUNTER — Encounter: Payer: Self-pay | Admitting: Orthopaedic Surgery

## 2019-11-10 NOTE — Telephone Encounter (Signed)
Let her know that I have reviewed her MRI and that I'm more than happy to see her in the office to go over the MRI and talk about treatment options.

## 2019-11-17 DIAGNOSIS — S83241D Other tear of medial meniscus, current injury, right knee, subsequent encounter: Secondary | ICD-10-CM | POA: Diagnosis not present

## 2019-11-17 DIAGNOSIS — M1711 Unilateral primary osteoarthritis, right knee: Secondary | ICD-10-CM | POA: Diagnosis not present

## 2019-11-17 DIAGNOSIS — M25561 Pain in right knee: Secondary | ICD-10-CM | POA: Diagnosis not present

## 2019-11-26 DIAGNOSIS — M1711 Unilateral primary osteoarthritis, right knee: Secondary | ICD-10-CM | POA: Diagnosis not present

## 2019-11-26 DIAGNOSIS — M7061 Trochanteric bursitis, right hip: Secondary | ICD-10-CM | POA: Diagnosis not present

## 2019-11-26 DIAGNOSIS — G894 Chronic pain syndrome: Secondary | ICD-10-CM | POA: Diagnosis not present

## 2019-11-26 DIAGNOSIS — M1612 Unilateral primary osteoarthritis, left hip: Secondary | ICD-10-CM | POA: Diagnosis not present

## 2019-11-27 DIAGNOSIS — X58XXXA Exposure to other specified factors, initial encounter: Secondary | ICD-10-CM | POA: Diagnosis not present

## 2019-11-27 DIAGNOSIS — M94261 Chondromalacia, right knee: Secondary | ICD-10-CM | POA: Diagnosis not present

## 2019-11-27 DIAGNOSIS — Y999 Unspecified external cause status: Secondary | ICD-10-CM | POA: Diagnosis not present

## 2019-11-27 DIAGNOSIS — M6751 Plica syndrome, right knee: Secondary | ICD-10-CM | POA: Diagnosis not present

## 2019-11-27 DIAGNOSIS — M2241 Chondromalacia patellae, right knee: Secondary | ICD-10-CM | POA: Diagnosis not present

## 2019-11-27 DIAGNOSIS — S83231A Complex tear of medial meniscus, current injury, right knee, initial encounter: Secondary | ICD-10-CM | POA: Diagnosis not present

## 2019-11-27 HISTORY — PX: KNEE SURGERY: SHX244

## 2019-12-02 ENCOUNTER — Ambulatory Visit: Payer: Federal, State, Local not specified - PPO | Attending: Internal Medicine

## 2019-12-02 DIAGNOSIS — Z20822 Contact with and (suspected) exposure to covid-19: Secondary | ICD-10-CM | POA: Diagnosis not present

## 2019-12-03 LAB — NOVEL CORONAVIRUS, NAA: SARS-CoV-2, NAA: NOT DETECTED

## 2019-12-10 DIAGNOSIS — G894 Chronic pain syndrome: Secondary | ICD-10-CM | POA: Diagnosis not present

## 2019-12-10 DIAGNOSIS — M1612 Unilateral primary osteoarthritis, left hip: Secondary | ICD-10-CM | POA: Diagnosis not present

## 2019-12-10 DIAGNOSIS — M5136 Other intervertebral disc degeneration, lumbar region: Secondary | ICD-10-CM | POA: Diagnosis not present

## 2019-12-10 DIAGNOSIS — M7061 Trochanteric bursitis, right hip: Secondary | ICD-10-CM | POA: Diagnosis not present

## 2019-12-16 ENCOUNTER — Other Ambulatory Visit: Payer: Self-pay | Admitting: Adult Health

## 2019-12-31 ENCOUNTER — Other Ambulatory Visit: Payer: Self-pay | Admitting: Adult Health

## 2020-01-09 ENCOUNTER — Telehealth: Payer: Self-pay | Admitting: Adult Health

## 2020-01-09 NOTE — Telephone Encounter (Signed)
Patient is being told by her [facility] that they no longer have her script for her Prozac that was ordered by Valetta Fuller last month (for a 3 mnth supply). Patient states she is completely out and is being told by pharm that she needs [facility]

## 2020-01-12 NOTE — Telephone Encounter (Signed)
3 month supply was sent to pharm on 11/2019. I called the pharmacy and was told they never received the order on their end. I gave a verbal for the Prozac 20mg  for 90 day supply. Called patient left message with this information. AS, CMA

## 2020-01-29 DIAGNOSIS — Z79899 Other long term (current) drug therapy: Secondary | ICD-10-CM | POA: Diagnosis not present

## 2020-01-29 DIAGNOSIS — M2241 Chondromalacia patellae, right knee: Secondary | ICD-10-CM | POA: Diagnosis not present

## 2020-01-29 DIAGNOSIS — M47816 Spondylosis without myelopathy or radiculopathy, lumbar region: Secondary | ICD-10-CM | POA: Diagnosis not present

## 2020-01-29 DIAGNOSIS — M7060 Trochanteric bursitis, unspecified hip: Secondary | ICD-10-CM | POA: Diagnosis not present

## 2020-01-29 DIAGNOSIS — Z79891 Long term (current) use of opiate analgesic: Secondary | ICD-10-CM | POA: Diagnosis not present

## 2020-01-29 DIAGNOSIS — G894 Chronic pain syndrome: Secondary | ICD-10-CM | POA: Diagnosis not present

## 2020-02-16 DIAGNOSIS — M1612 Unilateral primary osteoarthritis, left hip: Secondary | ICD-10-CM | POA: Diagnosis not present

## 2020-02-16 DIAGNOSIS — M24152 Other articular cartilage disorders, left hip: Secondary | ICD-10-CM | POA: Diagnosis not present

## 2020-02-18 IMAGING — MG DIGITAL SCREENING BILATERAL MAMMOGRAM WITH TOMO AND CAD
6 of 12 series · 6 of 36 positions shown · non-contrast
Comparison: Previous exam(s).

CLINICAL DATA: Screening.

EXAM:
DIGITAL SCREENING BILATERAL MAMMOGRAM WITH TOMO AND CAD

[L MLO synth-2D (1 of 2)]
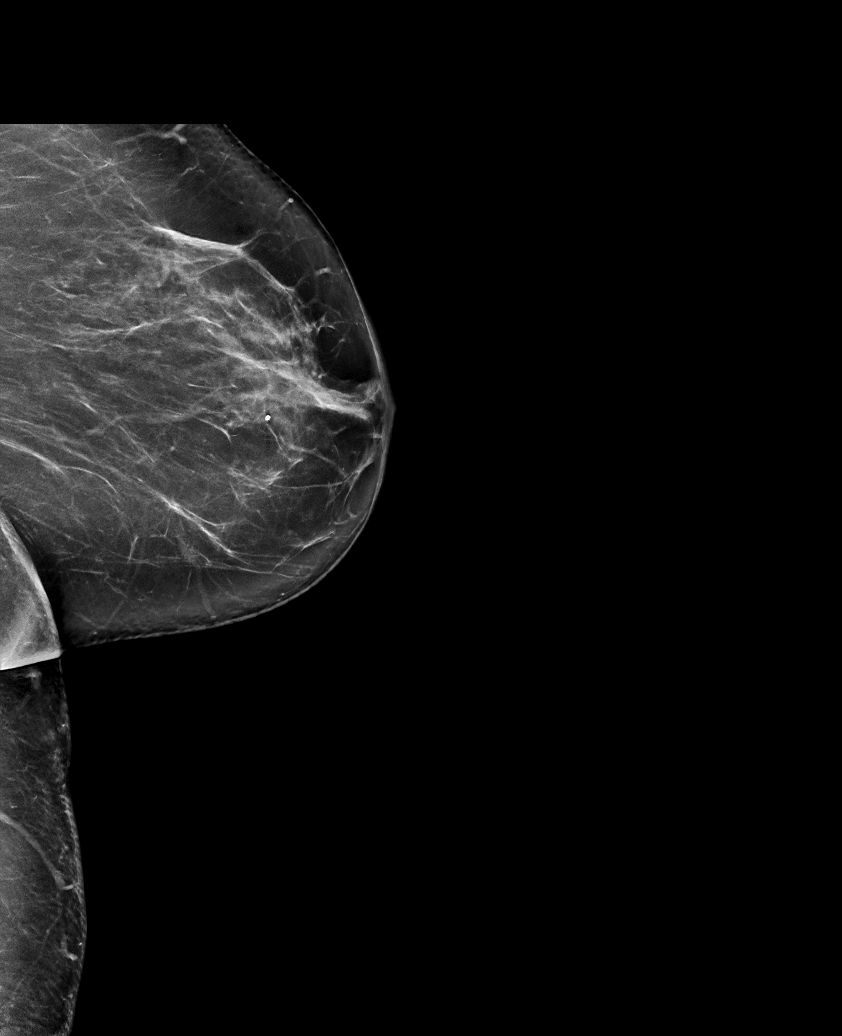

[R MLO synth-2D (1 of 2)]
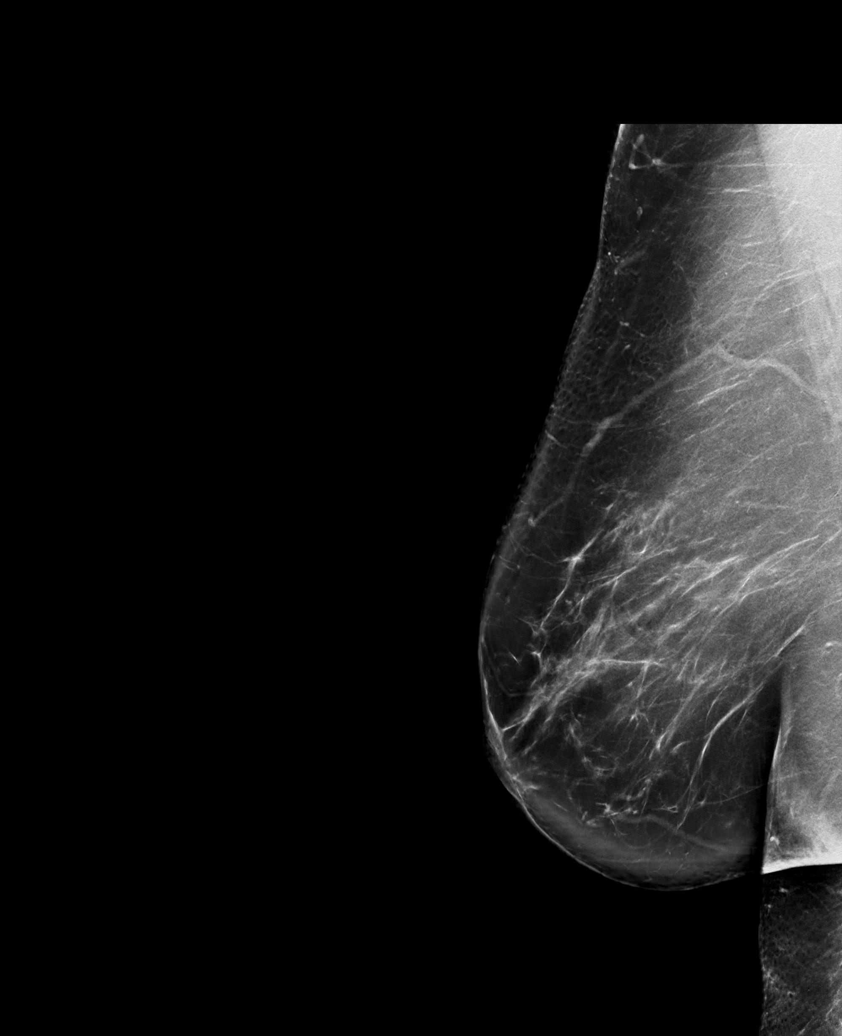

[L MLO synth-2D (2 of 2)]
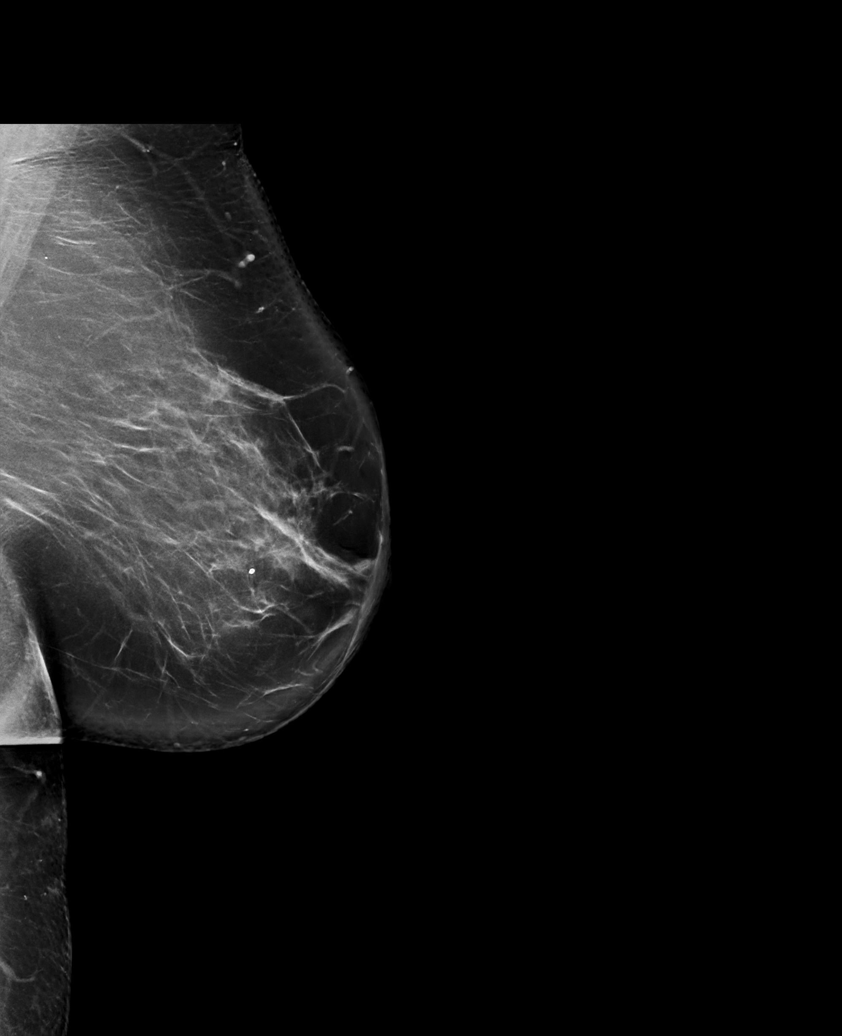

[L CC synth-2D]
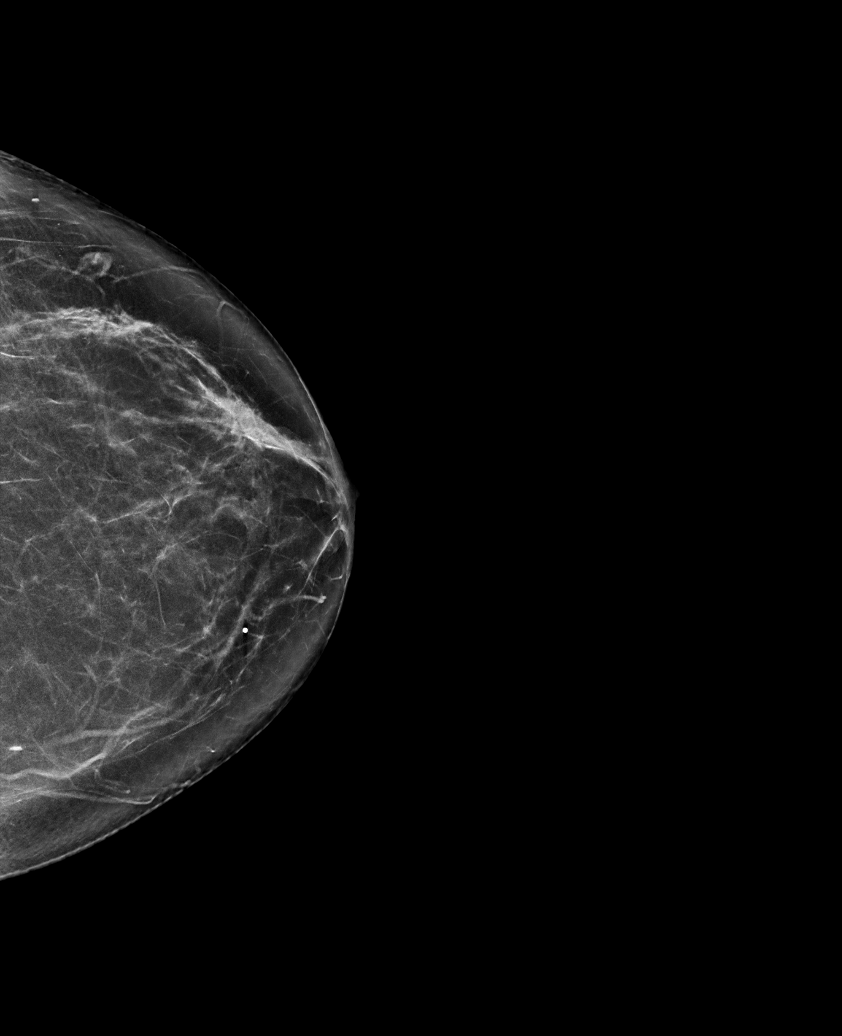

[R MLO synth-2D (2 of 2)]
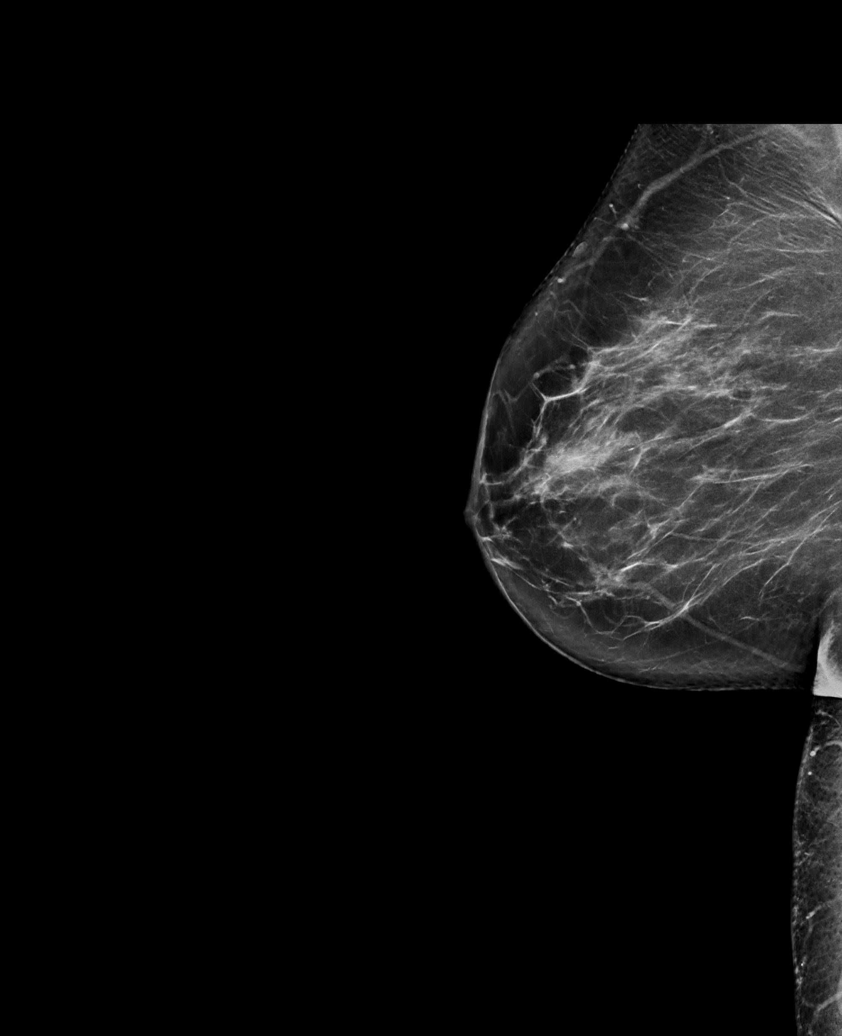

[R CC synth-2D]
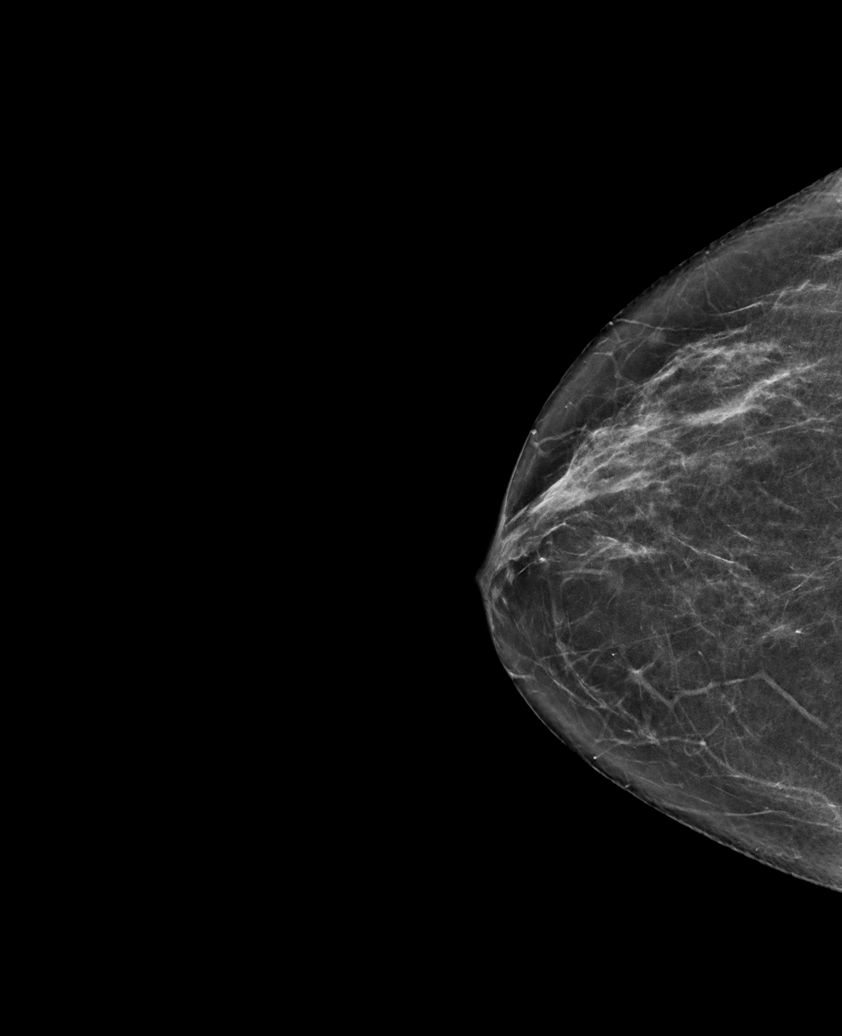

[6 of 36 positions shown; findings below may reference images not displayed]

ACR Breast Density Category b: There are scattered areas of
fibroglandular density.
FINDINGS: There are no findings suspicious for malignancy. Images were
processed with CAD.
IMPRESSION: No mammographic evidence of malignancy. A result letter of this
screening mammogram will be mailed directly to the patient.

RECOMMENDATION:
Screening mammogram in one year. (Code:[id number])

BI-RADS CATEGORY  1: Negative.

## 2020-02-26 DIAGNOSIS — M7060 Trochanteric bursitis, unspecified hip: Secondary | ICD-10-CM | POA: Diagnosis not present

## 2020-02-26 DIAGNOSIS — M47816 Spondylosis without myelopathy or radiculopathy, lumbar region: Secondary | ICD-10-CM | POA: Diagnosis not present

## 2020-02-26 DIAGNOSIS — M2241 Chondromalacia patellae, right knee: Secondary | ICD-10-CM | POA: Diagnosis not present

## 2020-02-26 DIAGNOSIS — G894 Chronic pain syndrome: Secondary | ICD-10-CM | POA: Diagnosis not present

## 2020-02-28 ENCOUNTER — Other Ambulatory Visit: Payer: Self-pay | Admitting: Neurology

## 2020-03-10 ENCOUNTER — Ambulatory Visit: Payer: Federal, State, Local not specified - PPO | Admitting: Physician Assistant

## 2020-03-13 ENCOUNTER — Other Ambulatory Visit: Payer: Self-pay | Admitting: Adult Health

## 2020-03-16 ENCOUNTER — Other Ambulatory Visit: Payer: Self-pay | Admitting: Neurology

## 2020-03-16 DIAGNOSIS — H93A9 Pulsatile tinnitus, unspecified ear: Secondary | ICD-10-CM

## 2020-03-16 DIAGNOSIS — G4484 Primary exertional headache: Secondary | ICD-10-CM

## 2020-03-16 DIAGNOSIS — H547 Unspecified visual loss: Secondary | ICD-10-CM

## 2020-03-16 DIAGNOSIS — R51 Headache with orthostatic component, not elsewhere classified: Secondary | ICD-10-CM

## 2020-03-16 DIAGNOSIS — R519 Headache, unspecified: Secondary | ICD-10-CM

## 2020-03-16 DIAGNOSIS — G441 Vascular headache, not elsewhere classified: Secondary | ICD-10-CM

## 2020-03-17 ENCOUNTER — Other Ambulatory Visit: Payer: Self-pay | Admitting: *Deleted

## 2020-03-17 ENCOUNTER — Other Ambulatory Visit: Payer: Self-pay | Admitting: Adult Health

## 2020-03-17 MED ORDER — BUTALBITAL-ASPIRIN-CAFFEINE 50-325-40 MG PO CAPS: ORAL_CAPSULE | ORAL | 0 refills | Status: DC

## 2020-03-17 MED ORDER — RIZATRIPTAN BENZOATE 10 MG PO TABS: ORAL_TABLET | ORAL | 1 refills | Status: DC

## 2020-03-17 MED ORDER — GABAPENTIN 300 MG PO CAPS: ORAL_CAPSULE | ORAL | 0 refills | Status: DC

## 2020-03-17 NOTE — Telephone Encounter (Signed)
1) Medication(s) Requested (by name): gabapentin (NEURONTIN) 300 MG capsule  rizatriptan (MAXALT) 10 MG tablet  butalbital-aspirin-caffeine (FIORINAL) 50-325-40 MG capsule  promethazine (PHENERGAN) 25 MG tablet [  2) Pharmacy of Choice:  [id number] [address] [place] [address]  Phone:  [phone] Fax:  [phone]

## 2020-03-17 NOTE — Addendum Note (Signed)
Addended by: Gildardo Griffes on: 03/17/2020 01:15 PM   Modules accepted: Orders

## 2020-03-25 DIAGNOSIS — M2241 Chondromalacia patellae, right knee: Secondary | ICD-10-CM | POA: Diagnosis not present

## 2020-03-25 DIAGNOSIS — M7060 Trochanteric bursitis, unspecified hip: Secondary | ICD-10-CM | POA: Diagnosis not present

## 2020-03-25 DIAGNOSIS — M47816 Spondylosis without myelopathy or radiculopathy, lumbar region: Secondary | ICD-10-CM | POA: Diagnosis not present

## 2020-03-25 DIAGNOSIS — G894 Chronic pain syndrome: Secondary | ICD-10-CM | POA: Diagnosis not present

## 2020-04-05 ENCOUNTER — Other Ambulatory Visit: Payer: Self-pay

## 2020-04-05 ENCOUNTER — Ambulatory Visit (INDEPENDENT_AMBULATORY_CARE_PROVIDER_SITE_OTHER): Payer: Federal, State, Local not specified - PPO | Admitting: Physician Assistant

## 2020-04-05 ENCOUNTER — Encounter: Payer: Self-pay | Admitting: Physician Assistant

## 2020-04-05 VITALS — Temp 98.3°F | Ht 65.0 in | Wt 180.0 lb

## 2020-04-05 DIAGNOSIS — M25552 Pain in left hip: Secondary | ICD-10-CM

## 2020-04-05 DIAGNOSIS — F329 Major depressive disorder, single episode, unspecified: Secondary | ICD-10-CM

## 2020-04-05 DIAGNOSIS — F32A Depression, unspecified: Secondary | ICD-10-CM

## 2020-04-05 DIAGNOSIS — M25551 Pain in right hip: Secondary | ICD-10-CM

## 2020-04-05 MED ORDER — DULOXETINE HCL 60 MG PO CPEP: 60.0000 mg | ORAL_CAPSULE | Freq: Every day | ORAL | 1 refills | Status: DC

## 2020-04-05 NOTE — Progress Notes (Signed)
Telehealth office visit note for Lorrene Reid, PA-C- at Primary Care at [facility]   I connected with Gerline Legacy. Rapozo at 1:26 pm by telefone and verified that I am speaking with the correct person using two identifiers.    . Location of the patient: Home . Location of the provider: Office - This visit type was conducted due to national recommendations for restrictions regarding the COVID-19 Pandemic (e.g. social distancing) in an effort to limit this patient's exposure and mitigate transmission in our community.    - No physical exam could be performed with this format, beyond that communicated to [place] by the patient/ family members as noted.   - Additionally my office staff/ schedulers were to discuss with the patient that there may be a monetary charge related to this service, depending on their medical insurance.  My understanding is that patient understood and consented to proceed.     _________________________________________________________________________________   History of Present Illness: Patient calls in for 6 month chronic follow-up for mood management. Pt reports she had surgery to repair a torn meniscus and cartilage of right knee, and is recuperating well. She is doing home exercises to help with muscle strengthening. She reports medication compliance with Cymbalta and Prozac, which continue to help control her mood. States she was on Lexapro years ago during menopause to help with anxiety, and when her husband passed away she was switched to Cymbalta to help with mood and hip pain. Then Prozac was added as adjunct therapy. States it has been 3 years since her husband deceased and this year she really feels like she's "back in the game." She endorses a good support system. Pt expresses one of her goals this year is to lose weight and hopes taking care of the issues with her R knee is going to allow her to increase her physical activity level. Pt continues to see pain  management for her hip pain.   No flowsheet data found.  Depression screen [facility] 2/9 04/05/2020 09/10/2019 12/19/2018 07/18/2018 05/16/2018  Decreased Interest 0 0 0 0 2  Down, Depressed, Hopeless 0 0 0 0 2  PHQ - 2 Score 0 0 0 0 4  Altered sleeping 1 0 0 0 2  Tired, decreased energy 1 0 0 1 2  Change in appetite 0 3 0 1 2  Feeling bad or failure about yourself  0 1 0 0 2  Trouble concentrating 0 0 0 0 0  Moving slowly or fidgety/restless 0 0 0 0 0  Suicidal thoughts 0 0 0 0 0  PHQ-9 Score 2 4 0 2 12  Difficult doing work/chores Not difficult at all Not difficult at all - Not difficult at all Somewhat difficult      Impression and Recommendations:     1. Depression, unspecified depression type   2. Bilateral hip pain    Mood, depression:  - stable, PHQ9 score of 2. Denies SI/HI. - Continue Prozac and Cymbalta. Pt requested 90 day refills per pharmacy/insurance. Refilled Cymbalta. Prozac recently refilled for 90 days.  - Will continue to monitor.  B/L hip pain: - Followed by Pain Management   - As part of my medical decision making, I reviewed the following data within the [place] History obtained from pt /family, CMA notes reviewed and incorporated if applicable, Labs reviewed, Radiograph/ tests reviewed if applicable and OV notes from prior OV's with me, as well as any other specialists she/he has seen since seeing me last,  were all reviewed and used in my medical decision making process today.    - Additionally, when appropriate, discussion had with patient regarding our treatment plan, and their biases/concerns about that plan were used in my medical decision making today.    - The patient agreed with the plan and demonstrated an understanding of the instructions.   No barriers to understanding were identified.     - The patient was advised to call back or seek an in-person evaluation if the symptoms worsen or if the condition fails to improve as  anticipated.   Return for CPE in 5-6 mons and FBW 1 wk prior.    No orders of the defined types were placed in this encounter.   Meds ordered this encounter  Medications  . DULoxetine (CYMBALTA) 60 MG capsule    Sig: Take 1 capsule (60 mg total) by mouth daily.    Dispense:  90 capsule    Refill:  1    Order Specific Question:   Supervising Provider    Answer:   Beatrice Lecher D [[id number]]    Medications Discontinued During This Encounter  Medication Reason  . DULoxetine (CYMBALTA) 60 MG capsule Reorder       Time spent on visit including pre-visit chart review and post-visit care was 16 minutes.      The [place] was signed into law in 2016 which includes the topic of electronic health records.  This provides immediate access to information in MyChart.  This includes consultation notes, operative notes, office notes, lab results and pathology reports.  If you have any questions about what you read please let us know at your next visit or call us at the office.  We are right here with you.   __________________________________________________________________________________     Patient Care Team    Relationship Specialty Notifications Start End  Lorrene Reid, [place] PCP - General Physician Assistant  04/05/20   Marlou Sa, Tonna Corner, MD Consulting Physician Orthopedic Surgery  12/07/16   Renie Ora, MD Referring Physician Anesthesiology  12/07/16   Anastasio Auerbach, MD (Inactive) Consulting Physician Gynecology  12/07/16      -Vitals obtained; medications/ allergies reconciled;  personal medical, social, Sx etc.histories were updated by CMA, reviewed by me and are reflected in chart   Patient Active Problem List   Diagnosis Date Noted  . Pain in right knee 10/06/2019  . Blood glucose elevated 09/10/2019  . Chronic migraine without aura without status migrainosus, not intractable 03/26/2019  . Dysuria 07/18/2018  . Depression 05/16/2018  .  Grief 05/16/2018  . Vitamin D deficiency 11/08/2017  . Screening for cervical cancer 11/08/2017  . Healthcare maintenance 07/05/2017  . Menstrual migraine with status migrainosus, not intractable 07/05/2017  . Back pain of lumbar region with sciatica 07/05/2017  . Varicose veins of lower extremities with other complications [id number]     Current Meds  Medication Sig  . baclofen (LIORESAL) 10 MG tablet Take 1 tablet (10 mg total) by mouth 2 (two) times daily. Take 1/2 to 1 tablet 2 x day if needed for muscle spasm  . BIOTIN PO 10,000 mcg oral once daily  . butalbital-aspirin-caffeine (FIORINAL) 50-325-40 MG capsule TAKE 1 CAPSULE BY MOUTH EVERY 6 HOURS AS NEEDED FOR HEADACHE  . cetirizine (ZYRTEC) 10 MG tablet TAKE 1 TABLET DAILY FOR ALLERGIES  . CRANBERRY PO Take by mouth daily. With vitamin C  . diclofenac sodium (VOLTAREN) 1 % GEL Apply 1 application topically 3 (three) times daily  as needed.  . docusate sodium (COLACE) 100 MG capsule Take 100 mg by mouth 2 (two) times daily.  . DULoxetine (CYMBALTA) 60 MG capsule Take 1 capsule (60 mg total) by mouth daily.  . Esomeprazole Magnesium (NEXIUM PO) Take 1 tablet by mouth daily.  Marland Kitchen FLUoxetine (PROZAC) 20 MG capsule TAKE 1 TABLET ONCE DAILY  . fluticasone (FLONASE) 50 MCG/ACT nasal spray INHALE 1-2 SPRAYS IN EACH NOSTRIL TWICE DAILY  . gabapentin (NEURONTIN) 300 MG capsule TAKE 1 CAPSULE BY MOUTH THREE TIMES A DAY  . HYDROcodone-acetaminophen (NORCO) 10-325 MG tablet Take 1 tablet by mouth 4 (four) times daily.   Marland Kitchen ibuprofen (ADVIL) 800 MG tablet Take 1 tablet by mouth at bedtime.   Marland Kitchen ipratropium (ATROVENT) 0.03 % nasal spray PLACE 2 SPRAYS INTO THE NOSE 3 (THREE) TIMES DAILY.  Marland Kitchen loperamide (IMODIUM) 2 MG capsule Take 1 capsule (2 mg total) by mouth 4 (four) times daily as needed for diarrhea or loose stools.  Marland Kitchen morphine (MS CONTIN) 30 MG 12 hr tablet Take 1 tablet by mouth 2 (two) times daily.  . Multiple Vitamin (MULTIVITAMIN) capsule  Take 1 capsule by mouth daily.  . phenazopyridine (PYRIDIUM) 200 MG tablet Take 1 tablet (200 mg total) by mouth 3 (three) times daily with meals. Take for two days  . promethazine (PHENERGAN) 25 MG tablet Take 1 tablet (25 mg total) by mouth every 6 (six) hours as needed for nausea or vomiting.  . rizatriptan (MAXALT) 10 MG tablet May repeat in 2 hours if needed  . [DISCONTINUED] DULoxetine (CYMBALTA) 60 MG capsule TAKE 1 CAPSULE BY MOUTH EVERY DAY   Current Facility-Administered Medications for the 04/05/20 encounter (Office Visit) with Lorrene Reid, PA-C  Medication  . 0.9 %  sodium chloride infusion     Allergies:  Allergies  Allergen Reactions  . Sulfa Antibiotics Swelling and Rash     ROS:  See above HPI for pertinent positives and negatives   Objective:   Temperature 98.3 F (36.8 C), temperature source Oral, height 5\' 5"  (1.651 m), weight 180 lb (81.6 kg).  (if some vitals are omitted, this means that patient was UNABLE to obtain them even though they were asked to get them prior to OV today.  They were asked to call us at their earliest convenience with these once obtained. ) General: A & O * 3; sounds in no acute distress; in usual state of health.  Skin: Pt confirms warm and dry extremities and pink fingertips HEENT: Pt confirms lips non-cyanotic Chest: Patient confirms normal chest excursion and movement Respiratory: speaking in full sentences, no conversational dyspnea; patient confirms no use of accessory muscles Psych: insight appears good, mood- appears full

## 2020-04-22 DIAGNOSIS — M2241 Chondromalacia patellae, right knee: Secondary | ICD-10-CM | POA: Diagnosis not present

## 2020-04-22 DIAGNOSIS — G894 Chronic pain syndrome: Secondary | ICD-10-CM | POA: Diagnosis not present

## 2020-04-22 DIAGNOSIS — M7060 Trochanteric bursitis, unspecified hip: Secondary | ICD-10-CM | POA: Diagnosis not present

## 2020-04-22 DIAGNOSIS — M47816 Spondylosis without myelopathy or radiculopathy, lumbar region: Secondary | ICD-10-CM | POA: Diagnosis not present

## 2020-05-04 ENCOUNTER — Other Ambulatory Visit: Payer: Self-pay | Admitting: Physician Assistant

## 2020-05-04 DIAGNOSIS — Z1231 Encounter for screening mammogram for malignant neoplasm of breast: Secondary | ICD-10-CM

## 2020-05-15 ENCOUNTER — Other Ambulatory Visit: Payer: Self-pay | Admitting: Neurology

## 2020-05-20 DIAGNOSIS — M2241 Chondromalacia patellae, right knee: Secondary | ICD-10-CM | POA: Diagnosis not present

## 2020-05-20 DIAGNOSIS — Z79899 Other long term (current) drug therapy: Secondary | ICD-10-CM | POA: Diagnosis not present

## 2020-05-20 DIAGNOSIS — M7060 Trochanteric bursitis, unspecified hip: Secondary | ICD-10-CM | POA: Diagnosis not present

## 2020-05-20 DIAGNOSIS — G894 Chronic pain syndrome: Secondary | ICD-10-CM | POA: Diagnosis not present

## 2020-05-20 DIAGNOSIS — Z79891 Long term (current) use of opiate analgesic: Secondary | ICD-10-CM | POA: Diagnosis not present

## 2020-05-20 DIAGNOSIS — M47816 Spondylosis without myelopathy or radiculopathy, lumbar region: Secondary | ICD-10-CM | POA: Diagnosis not present

## 2020-05-28 ENCOUNTER — Ambulatory Visit: Payer: Federal, State, Local not specified - PPO

## 2020-06-03 ENCOUNTER — Ambulatory Visit: Payer: Federal, State, Local not specified - PPO

## 2020-06-15 ENCOUNTER — Other Ambulatory Visit: Payer: Self-pay | Admitting: Adult Health

## 2020-06-15 ENCOUNTER — Other Ambulatory Visit: Payer: Self-pay | Admitting: Neurology

## 2020-06-17 ENCOUNTER — Ambulatory Visit: Payer: Federal, State, Local not specified - PPO | Admitting: Neurology

## 2020-06-17 DIAGNOSIS — M1612 Unilateral primary osteoarthritis, left hip: Secondary | ICD-10-CM | POA: Diagnosis not present

## 2020-07-10 ENCOUNTER — Other Ambulatory Visit: Payer: Self-pay | Admitting: Neurology

## 2020-07-14 DIAGNOSIS — Z79899 Other long term (current) drug therapy: Secondary | ICD-10-CM | POA: Diagnosis not present

## 2020-07-14 DIAGNOSIS — Z79891 Long term (current) use of opiate analgesic: Secondary | ICD-10-CM | POA: Diagnosis not present

## 2020-07-14 DIAGNOSIS — M792 Neuralgia and neuritis, unspecified: Secondary | ICD-10-CM | POA: Diagnosis not present

## 2020-07-14 DIAGNOSIS — G894 Chronic pain syndrome: Secondary | ICD-10-CM | POA: Diagnosis not present

## 2020-07-14 DIAGNOSIS — F4542 Pain disorder with related psychological factors: Secondary | ICD-10-CM | POA: Diagnosis not present

## 2020-07-14 DIAGNOSIS — G609 Hereditary and idiopathic neuropathy, unspecified: Secondary | ICD-10-CM | POA: Diagnosis not present

## 2020-07-15 DIAGNOSIS — G894 Chronic pain syndrome: Secondary | ICD-10-CM | POA: Diagnosis not present

## 2020-07-15 DIAGNOSIS — M1612 Unilateral primary osteoarthritis, left hip: Secondary | ICD-10-CM | POA: Diagnosis not present

## 2020-07-15 DIAGNOSIS — Z79899 Other long term (current) drug therapy: Secondary | ICD-10-CM | POA: Diagnosis not present

## 2020-07-15 DIAGNOSIS — M47816 Spondylosis without myelopathy or radiculopathy, lumbar region: Secondary | ICD-10-CM | POA: Diagnosis not present

## 2020-07-15 DIAGNOSIS — M7060 Trochanteric bursitis, unspecified hip: Secondary | ICD-10-CM | POA: Diagnosis not present

## 2020-07-15 DIAGNOSIS — Z79891 Long term (current) use of opiate analgesic: Secondary | ICD-10-CM | POA: Diagnosis not present

## 2020-08-12 DIAGNOSIS — G894 Chronic pain syndrome: Secondary | ICD-10-CM | POA: Diagnosis not present

## 2020-08-12 DIAGNOSIS — M47816 Spondylosis without myelopathy or radiculopathy, lumbar region: Secondary | ICD-10-CM | POA: Diagnosis not present

## 2020-08-12 DIAGNOSIS — M7060 Trochanteric bursitis, unspecified hip: Secondary | ICD-10-CM | POA: Diagnosis not present

## 2020-08-12 DIAGNOSIS — M1612 Unilateral primary osteoarthritis, left hip: Secondary | ICD-10-CM | POA: Diagnosis not present

## 2020-09-02 ENCOUNTER — Other Ambulatory Visit: Payer: Self-pay | Admitting: Physician Assistant

## 2020-09-02 DIAGNOSIS — E559 Vitamin D deficiency, unspecified: Secondary | ICD-10-CM

## 2020-09-02 DIAGNOSIS — Z0000 Encounter for general adult medical examination without abnormal findings: Secondary | ICD-10-CM

## 2020-09-06 ENCOUNTER — Other Ambulatory Visit: Payer: Self-pay

## 2020-09-06 ENCOUNTER — Other Ambulatory Visit: Payer: Federal, State, Local not specified - PPO

## 2020-09-06 DIAGNOSIS — Z0000 Encounter for general adult medical examination without abnormal findings: Secondary | ICD-10-CM

## 2020-09-06 DIAGNOSIS — E559 Vitamin D deficiency, unspecified: Secondary | ICD-10-CM | POA: Diagnosis not present

## 2020-09-07 LAB — COMPREHENSIVE METABOLIC PANEL
ALT: 15 IU/L (ref 0–32)
AST: 18 IU/L (ref 0–40)
Albumin/Globulin Ratio: 1.6 (ref 1.2–2.2)
Albumin: 4.2 g/dL (ref 3.8–4.9)
Alkaline Phosphatase: 92 IU/L (ref 44–121)
BUN/Creatinine Ratio: 11 (ref 9–23)
BUN: 8 mg/dL (ref 6–24)
Bilirubin Total: <0.2 mg/dL (ref 0.0–1.2)
CO2: 26 mmol/L (ref 20–29)
Calcium: 9.7 mg/dL (ref 8.7–10.2)
Chloride: 100 mmol/L (ref 96–106)
Creatinine, Ser: 0.72 mg/dL (ref 0.57–1.00)
GFR calc Af Amer: 110 mL/min/{1.73_m2} (ref >59)
GFR calc non Af Amer: 95 mL/min/{1.73_m2} (ref >59)
Globulin, Total: 2.7 g/dL (ref 1.5–4.5)
Glucose: 103 mg/dL — ABNORMAL HIGH (ref 65–99)
Potassium: 4 mmol/L (ref 3.5–5.2)
Sodium: 140 mmol/L (ref 134–144)
Total Protein: 6.9 g/dL (ref 6.0–8.5)

## 2020-09-07 LAB — CBC
Hematocrit: 36 % (ref 34.0–46.6)
Hemoglobin: 12 g/dL (ref 11.1–15.9)
MCH: 29.6 pg (ref 26.6–33.0)
MCHC: 33.3 g/dL (ref 31.5–35.7)
MCV: 89 fL (ref 79–97)
Platelets: 406 10*3/uL (ref 150–450)
RBC: 4.06 x10E6/uL (ref 3.77–5.28)
RDW: 13.7 % (ref 11.7–15.4)
WBC: 7.7 10*3/uL (ref 3.4–10.8)

## 2020-09-07 LAB — VITAMIN D 25 HYDROXY (VIT D DEFICIENCY, FRACTURES): Vit D, 25-Hydroxy: 32 ng/mL (ref 30.0–100.0)

## 2020-09-07 LAB — LIPID PANEL
Chol/HDL Ratio: 3.5 ratio (ref 0.0–4.4)
Cholesterol, Total: 191 mg/dL (ref 100–199)
HDL: 55 mg/dL (ref >39)
LDL Chol Calc (NIH): 93 mg/dL (ref 0–99)
Triglycerides: 256 mg/dL — ABNORMAL HIGH (ref 0–149)
VLDL Cholesterol Cal: 43 mg/dL — ABNORMAL HIGH (ref 5–40)

## 2020-09-07 LAB — HEMOGLOBIN A1C
Est. average glucose Bld gHb Est-mCnc: 111 mg/dL
Hgb A1c MFr Bld: 5.5 % (ref 4.8–5.6)

## 2020-09-07 LAB — TSH: TSH: 1.71 u[IU]/mL (ref 0.450–4.500)

## 2020-09-08 NOTE — Progress Notes (Signed)
[facility]    Provider:  Dr Jaynee Eagles Referring Provider: Esaw Grandchild, NP Primary Care Physician:  Lorrene Reid, PA-C  CC:  Migraines  Interval history 09/09/2020: She is doing well on Gabapentin. She had a migraine 3 weeks ago and prior about a year. maxalt works well. Unfortunately sometimes she will wake with a headache and the maxalt takes time to work, we discussed strategies and nasal sprays and injections and I will give her zomig nasal sample and zembrace for when she wakes with a migraine or have acute severity.  Interval history 03/26/2019: Follow up for migraines  She had a hard year, her husband passed away. Her headaches are the same, the more he gets into menopause the more variable. The triptan works. She has not had nausea. The rizatriptan works, sometimes it will come back later in the day so advised to take with alleve. She rarely takes the Fiorinal. It is less evident if she is having a migraine, it may start out at as a headache now and may or may not progress to a migraine. She may try tylenol and if it doesn't work she takes a fiorinal. She may take 8 Rizatriptans sometimes more. She takes 1/3 of the prescription of the bottle. Birth control may help stabilize  I suggested we start a preventative. She would like to keep a headache journal. She is waking in the middle of the night with headaches and in the morning. The headaches are positional and exertional. Monring headaches. Worsening vision loss. Her husband dies so she did not get imaging, worsening pulsatule tinnitus. Needs to get imaging completed due to progression of symptoms.  HPI:  Christine Frank is a 54 y.o. female here as a referral from Dr. Loleta Books for migraines. Started at the age of 12-13 around the time she started her period. Migraines improved with birth control and having kids. Migraines became severe again in her 33s. Had significant nausea and vomiting. Migraines worse around  her menses. She would get one at the start of menses, at the end of menstrual cycle and maybe one somewhere in between. She started having migraines with hot flashes with menopause. Her mother had 2 aneurysms and so did her aunt. She has 25 headache free days. Maybe 1-2 migraine days a month. Her migraines are unilateral, like her head is in a vice, light and sound sensitivity, nausea and vomiting. She wakes with them, worse bending over, positional. She has vision loss in both eyes peripheral vision loss. Hot flash triggers. Aunt has migraines. She has neck pain as well with the headaches. She has pulsatile tinnitus as well. No other focal neurologic deficits, associated symptoms, inciting events or modifiable factors.  Reviewed notes, labs and imaging from outside physicians, which showed:  Personally reviewed images and agree with the following: Findings: No plain film evidence of cervical spine fracture or malalignment.  On the swimmer's view, there is minimal irregularity of the anterior superior aspect of the T8 vertebral body.  This may be projectional origin.  If there is any suspicion of thoracic spine injury this will require further investigation.   IMPRESSION: No plain film evidence of cervical spine fracture or malalignment  Tsh/cbc/bmp unremarkable  Review of Systems: Patient complains of symptoms per HPI as well as the following symptoms: migraines, loss of husband . Pertinent negatives and positives per HPI. All others negative    Social History   Socioeconomic History  . Marital status: Widowed    Spouse name:  Not on file  . Number of children: 2  . Years of education: Not on file  . Highest education level: Bachelor's degree (e.g., BA, AB, BS)  Occupational History  . Not on file  Tobacco Use  . Smoking status: Never Smoker  . Smokeless tobacco: Never Used  . Tobacco comment: tried tobacco a few times as a teen, never picked up the habit  Vaping Use  . Vaping Use:  Never used  Substance and Sexual Activity  . Alcohol use: Yes    Alcohol/week: 0.0 standard drinks    Comment: Rare; 2-3 times per year socially  . Drug use: No  . Sexual activity: Yes    Birth control/protection: None    Comment: 1st intercourse 52 yo-5 partners  Other Topics Concern  . Not on file  Social History Narrative   Lives at home with her 2 sons. Has 6 children total, 4 from previous marriage. Husband passed away 1 year ago.   Right handed   Daily less than one cup of coffee & three 8-12 oz cups of decaffeinated soda daily   Social Determinants of Health   Financial Resource Strain:   . Difficulty of Paying Living Expenses: Not on file  Food Insecurity:   . Worried About Charity fundraiser in the Last Year: Not on file  . Ran Out of Food in the Last Year: Not on file  Transportation Needs:   . Lack of Transportation (Medical): Not on file  . Lack of Transportation (Non-Medical): Not on file  Physical Activity:   . Days of Exercise per Week: Not on file  . Minutes of Exercise per Session: Not on file  Stress:   . Feeling of Stress : Not on file  Social Connections:   . Frequency of Communication with Friends and Family: Not on file  . Frequency of Social Gatherings with Friends and Family: Not on file  . Attends Religious Services: Not on file  . Active Member of Clubs or Organizations: Not on file  . Attends Archivist Meetings: Not on file  . Marital Status: Not on file  Intimate Partner Violence:   . Fear of Current or Ex-Partner: Not on file  . Emotionally Abused: Not on file  . Physically Abused: Not on file  . Sexually Abused: Not on file    Family History  Problem Relation Age of Onset  . Hypertension Father   . Diabetes Father   . Hyperlipidemia Father   . Alcohol abuse Maternal Grandmother   . Alcohol abuse Maternal Grandfather   . Heart attack Paternal Grandfather   . Cerebral aneurysm Mother   . Cerebral aneurysm Maternal Aunt   .  Migraines Maternal Aunt   . Colon cancer Neg Hx   . Esophageal cancer Neg Hx   . Rectal cancer Neg Hx   . Stomach cancer Neg Hx     Past Medical History:  Diagnosis Date  . Allergy   . Anemia   . Chronic hip pain   . Chronic leg pain    bilateral  . Depression   . Migraine   . PMDD (premenstrual dysphoric disorder)   . Restless leg   . S/P epidural steroid injection    affects legs is seen at pain clinic.    Past Surgical History:  Procedure Laterality Date  . CERVICAL CERCLAGE    . CESAREAN SECTION     x 2  . CESAREAN SECTION  1994  . CESAREAN SECTION  1999  . KNEE SURGERY Right 11/27/2019   torn meniscus and torn cartilage repaired    Current Outpatient Medications  Medication Sig Dispense Refill  . baclofen (LIORESAL) 10 MG tablet Take 1 tablet (10 mg total) by mouth 2 (two) times daily. Take 1/2 to 1 tablet 2 x day if needed for muscle spasm 60 tablet 0  . BIOTIN PO 10,000 mcg oral once daily    . butalbital-aspirin-caffeine (FIORINAL) 50-325-40 MG capsule TAKE 1 CAPSULE BY MOUTH EVERY 6 HOURS AS NEEDED FOR HEADACHE. 30 MUST LAST 90 DAYS 30 capsule 0  . cetirizine (ZYRTEC) 10 MG tablet TAKE 1 TABLET DAILY FOR ALLERGIES 90 tablet 3  . CRANBERRY PO Take by mouth daily. With vitamin C    . diclofenac sodium (VOLTAREN) 1 % GEL Apply 1 application topically 3 (three) times daily as needed.  1  . docusate sodium (COLACE) 100 MG capsule Take 100 mg by mouth 2 (two) times daily.    . DULoxetine (CYMBALTA) 60 MG capsule Take 1 capsule (60 mg total) by mouth daily. 90 capsule 1  . Esomeprazole Magnesium (NEXIUM PO) Take 1 tablet by mouth daily.    . fluticasone (FLONASE) 50 MCG/ACT nasal spray INHALE 1-2 SPRAYS IN EACH NOSTRIL TWICE DAILY 48 mL 1  . gabapentin (NEURONTIN) 300 MG capsule TAKE 1 CAPSULE BY MOUTH THREE TIMES A DAY 270 capsule 3  . HYDROcodone-acetaminophen (NORCO) 10-325 MG tablet Take 1 tablet by mouth 4 (four) times daily.     Marland Kitchen ibuprofen (ADVIL) 800 MG tablet  Take 1 tablet by mouth at bedtime.     Marland Kitchen ipratropium (ATROVENT) 0.03 % nasal spray PLACE 2 SPRAYS INTO THE NOSE 3 (THREE) TIMES DAILY. 90 mL 1  . loperamide (IMODIUM) 2 MG capsule Take 1 capsule (2 mg total) by mouth 4 (four) times daily as needed for diarrhea or loose stools. 12 capsule 0  . MELATONIN PO Take by mouth.    . morphine (MS CONTIN) 30 MG 12 hr tablet Take 1 tablet by mouth 2 (two) times daily.    . Multiple Vitamin (MULTIVITAMIN) capsule Take 1 capsule by mouth daily.    . promethazine (PHENERGAN) 25 MG tablet Take 1 tablet (25 mg total) by mouth every 6 (six) hours as needed for nausea or vomiting. 30 tablet 11  . rizatriptan (MAXALT) 10 MG tablet TAKE 1 TABLET AT ONSET OF HEADACHE, MAY REPEAT ONCE IN 2 HOURS IF NEEDED 10 tablet 1  . VITAMIN D PO Take by mouth.    . SUMAtriptan Succinate (ZEMBRACE SYMTOUCH) 3 MG/0.5ML SOAJ Spray into one nostril. May repeat in 15 minutes. Can repeat in 2 hours. Max 4 injections daily. 6 mL 11  . zolmitriptan (ZOMIG) 5 MG nasal solution Place 1 spray into the nose 2 (two) times daily as needed for migraine. 6 each 0   Current Facility-Administered Medications  Medication Dose Route Frequency Provider Last Rate Last Admin  . 0.9 %  sodium chloride infusion  500 mL Intravenous Continuous Mauri Pole, MD        Allergies as of 09/09/2020 - Review Complete 09/09/2020  Allergen Reaction Noted  . Sulfa antibiotics Swelling and Rash 03/26/2012    Vitals: BP 116/78 (BP Location: Right Arm, Patient Position: Sitting)   Pulse 97   Ht 5\' 5"  (1.651 m)   Wt 197 lb (89.4 kg)   BMI 32.78 kg/m  Last Weight:  Wt Readings from Last 1 Encounters:  09/09/20 197 lb (89.4 kg)  Last Height:   Ht Readings from Last 1 Encounters:  09/09/20 5\' 5"  (1.651 m)   Physical exam: Exam: Gen: NAD, conversant, well nourised, obese, well groomed                     CV: RRR, no MRG. No Carotid Bruits. No peripheral edema, warm, nontender Eyes: Conjunctivae  clear without exudates or hemorrhage  Neuro: Detailed Neurologic Exam  Speech:    Speech is normal; fluent and spontaneous with normal comprehension.  Cognition:    The patient is oriented to person, place, and time;     recent and remote memory intact;     language fluent;     normal attention, concentration,     fund of knowledge Cranial Nerves:    The pupils are equal, round, and reactive to light.  Visual fields are full. Extraocular movements are intact. Trigeminal sensation is intact and the muscles of mastication are normal. The face is symmetric. The palate elevates in the midline. Hearing intact. Voice is normal. Shoulder shrug is normal. The tongue has normal motion without fasciculations.   Motor Observation:    No asymmetry, no atrophy, and no involuntary movements noted. Tone:    Normal muscle tone.    Posture:    Posture is normal. normal erect    Strength:    Strength is intact in the upper and lower limbs.       Assessment/Plan: 54 year old with headaches, past medical history of migraines, recent peripheral vision loss could be migrainous but need a full evaluation.  Discussed preventative, migraines, headaches and concerning symptoms are increasing in frequency, recommended imaging, CGRP injectable or botox Preventative: Continue gabapentin for migraine, may also help for hot flashes Acute: rizatriptan or zembrace based on need RTC 1 year  Meds ordered this encounter  Medications  . SUMAtriptan Succinate (ZEMBRACE SYMTOUCH) 3 MG/0.5ML SOAJ    Sig: Spray into one nostril. May repeat in 15 minutes. Can repeat in 2 hours. Max 4 injections daily.    Dispense:  6 mL    Refill:  11  . zolmitriptan (ZOMIG) 5 MG nasal solution    Sig: Place 1 spray into the nose 2 (two) times daily as needed for migraine.    Dispense:  6 each    Refill:  0  . promethazine (PHENERGAN) 25 MG tablet    Sig: Take 1 tablet (25 mg total) by mouth every 6 (six) hours as needed for  nausea or vomiting.    Dispense:  30 tablet    Refill:  11  . gabapentin (NEURONTIN) 300 MG capsule    Sig: TAKE 1 CAPSULE BY MOUTH THREE TIMES A DAY    Dispense:  270 capsule    Refill:  3    Sarina Ill, MD  [facility] [address] [place], [place] [address]  Phone [phone] Fax [phone] I spent over 20 minutes of face-to-face and non-face-to-face time with patient on the  1. Chronic migraine without aura without status migrainosus, not intractable   2. Positional headache    diagnosis.  This included previsit chart review, lab review, study review, order entry, electronic health record documentation, patient education on the different diagnostic and therapeutic options, counseling and coordination of care, risks and benefits of management, compliance, or risk factor reduction

## 2020-09-09 ENCOUNTER — Encounter: Payer: Self-pay | Admitting: Neurology

## 2020-09-09 ENCOUNTER — Ambulatory Visit: Payer: Federal, State, Local not specified - PPO | Admitting: Neurology

## 2020-09-09 ENCOUNTER — Other Ambulatory Visit: Payer: Self-pay

## 2020-09-09 ENCOUNTER — Telehealth: Payer: Self-pay | Admitting: Neurology

## 2020-09-09 VITALS — BP 116/78 | HR 97 | Ht 65.0 in | Wt 197.0 lb

## 2020-09-09 DIAGNOSIS — R51 Headache with orthostatic component, not elsewhere classified: Secondary | ICD-10-CM | POA: Diagnosis not present

## 2020-09-09 DIAGNOSIS — G43709 Chronic migraine without aura, not intractable, without status migrainosus: Secondary | ICD-10-CM | POA: Diagnosis not present

## 2020-09-09 MED ORDER — ZOLMITRIPTAN 5 MG NA SOLN: 1.0000 | Freq: Two times a day (BID) | NASAL | 0 refills | Status: DC | PRN

## 2020-09-09 MED ORDER — ZEMBRACE SYMTOUCH 3 MG/0.5ML ~~LOC~~ SOAJ: SUBCUTANEOUS | 11 refills | Status: DC

## 2020-09-09 MED ORDER — GABAPENTIN 300 MG PO CAPS: ORAL_CAPSULE | ORAL | 3 refills | Status: DC

## 2020-09-09 MED ORDER — PROMETHAZINE HCL 25 MG PO TABS
25.0000 mg | ORAL_TABLET | Freq: Four times a day (QID) | ORAL | 11 refills | Status: DC | PRN
Start: 2020-09-09 — End: 2021-09-16

## 2020-09-09 NOTE — Patient Instructions (Signed)
Sumatriptan injection What is this medicine? SUMATRIPTAN (soo ma TRIP tan) is used to treat migraines with or without aura. An aura is a strange feeling or visual disturbance that warns you of an attack. It is not used to prevent migraines. This medicine may be used for other purposes; ask your health care provider or pharmacist if you have questions. COMMON BRAND NAME(S): Alsuma, Imitrex, Imitrex STAT dose, Sumavel DosePro System, ZEMBRACE What should I tell my health care provider before I take this medicine? They need to know if you have any of these conditions:  cigarette smoker  circulation problems in fingers and toes  diabetes  heart disease  high blood pressure  high cholesterol  history of irregular heartbeat  history of stroke  kidney disease  liver disease  stomach or intestine problems  an unusual or allergic reaction to sumatriptan, latex, other medicines, foods, dyes, or preservatives  pregnant or trying to get pregnant  breast-feeding How should I use this medicine? This medicine is for injection under the skin. You will be taught how to prepare and give this medicine. Use exactly as directed. Do not take your medicine more often than directed. Talk to your pediatrician regarding the use of this medicine in children. Special care may be needed. Overdosage: If you think you have taken too much of this medicine contact a poison control center or emergency room at once. NOTE: This medicine is only for you. Do not share this medicine with others. What if I miss a dose? This does not apply. This medicine is not for regular use. What may interact with this medicine? Do not take this medicine with any of the following medicines:  certain medicines for migraine headache like almotriptan, eletriptan, frovatriptan, naratriptan, rizatriptan, sumatriptan, zolmitriptan  ergot alkaloids like dihydroergotamine, ergonovine, ergotamine, methylergonovine  MAOIs like  Carbex, Eldepryl, Marplan, Nardil, and Parnate This medicine may also interact with the following medications:  certain medicines for depression, anxiety, or psychotic disorders This list may not describe all possible interactions. Give your health care provider a list of all the medicines, herbs, non-prescription drugs, or dietary supplements you use. Also tell them if you smoke, drink alcohol, or use illegal drugs. Some items may interact with your medicine. What should I watch for while using this medicine? Visit your healthcare professional for regular checks on your progress. Tell your healthcare professional if your symptoms do not start to get better or if they get worse. You may get drowsy or dizzy. Do not drive, use machinery, or do anything that needs mental alertness until you know how this medicine affects you. Do not stand up or sit up quickly, especially if you are an older patient. This reduces the risk of dizzy or fainting spells. Alcohol may interfere with the effect of this medicine. Tell your healthcare professional right away if you have any change in your eyesight. If you take migraine medicines for 10 or more days a month, your migraines may get worse. Keep a diary of headache days and medicine use. Contact your healthcare professional if your migraine attacks occur more frequently. What side effects may I notice from receiving this medicine? Side effects that you should report to your doctor or health care professional as soon as possible:  allergic reactions like skin rash, itching or hives, swelling of the face, lips, or tongue  changes in vision  chest pain or chest tightness  signs and symptoms of a dangerous change in heartbeat or heart rhythm like chest pain;  dizziness; fast, irregular heartbeat; palpitations; feeling faint or lightheaded; falls; breathing problems  signs and symptoms of a stroke like changes in vision; confusion; trouble speaking or understanding;  severe headaches; sudden numbness or weakness of the face, arm or leg; trouble walking; dizziness; loss of balance or coordination  signs and symptoms of serotonin syndrome like irritable; confusion; diarrhea; fast or irregular heartbeat; muscle twitching; stiff muscles; trouble walking; sweating; high fever; seizures; chills; vomiting Side effects that usually do not require medical attention (report to your doctor or health care professional if they continue or are bothersome):  diarrhea  dizziness  drowsiness  dry mouth  headache  nausea, vomiting  pain, redness, or irritation at site where injected  pain, tingling, numbness in the hands or feet  stomach pain This list may not describe all possible side effects. Call your doctor for medical advice about side effects. You may report side effects to FDA at 1-800-FDA-1088. Where should I keep my medicine? Keep out of the reach of children. You will be instructed on how to store this medicine. Throw away any unused medicine after the expiration date on the label. NOTE: This sheet is a summary. It may not cover all possible information. If you have questions about this medicine, talk to your doctor, pharmacist, or health care provider.  2020 Elsevier/Gold Standard (2018-03-29 12:58:21) Zolmitriptan Nasal Spray What is this medicine? ZOLMITRIPTAN (zohl mi TRIP tan) is used to treat migraines with or without aura. An aura is a strange feeling or visual disturbance that warns you of an attack. It is not used to prevent migraines. This medicine may be used for other purposes; ask your health care provider or pharmacist if you have questions. COMMON BRAND NAME(S): Zomig What should I tell my health care provider before I take this medicine? They need to know if you have any of these conditions:  cigarette smoker  circulation problems in fingers and toes  diabetes  heart disease  high blood pressure  high cholesterol  history  of irregular heartbeat  history of stroke  kidney disease  liver disease  stomach or intestine problems  an unusual or allergic reaction to zolmitriptan, other medicines, foods, dyes, or preservatives  pregnant or trying to get pregnant  breast-feeding How should I use this medicine? This medicine is for use in the nose. Follow the directions on your product or prescription label. Do not use more often than directed. Make sure that you are using your nasal spray correctly. Ask your doctor or health care professional if you have any questions. Talk to your pediatrician regarding the use of this medicine in children. While this drug may be prescribed for children as young as 12 years for selected conditions, precautions do apply. Overdosage: If you think you have taken too much of this medicine contact a poison control center or emergency room at once. NOTE: This medicine is only for you. Do not share this medicine with others. What if I miss a dose? This does not apply. This medicine is not for regular use. What may interact with this medicine? Do not take this medicine with any of the following medicines:  certain medicines for migraine headache like almotriptan, eletriptan, frovatriptan, naratriptan, rizatriptan, sumatriptan, zolmitriptan  ergot alkaloids like dihydroergotamine, ergonovine, ergotamine, methylergonovine  MAOIs like Carbex, Eldepryl, Marplan, Nardil, and Parnate This medicine may also interact with the following medications:  certain medicines for depression, anxiety, or psychotic disorders  cimetidine This list may not describe all possible interactions. Give  your health care provider a list of all the medicines, herbs, non-prescription drugs, or dietary supplements you use. Also tell them if you smoke, drink alcohol, or use illegal drugs. Some items may interact with your medicine. What should I watch for while using this medicine? Visit your healthcare  professional for regular checks on your progress. Tell your healthcare professional if your symptoms do not start to get better or if they get worse. You may get drowsy or dizzy. Do not drive, use machinery, or do anything that needs mental alertness until you know how this medicine affects you. Do not stand up or sit up quickly, especially if you are an older patient. This reduces the risk of dizzy or fainting spells. Alcohol may interfere with the effect of this medicine. Your mouth may get dry. Chewing sugarless gum or sucking hard candy and drinking plenty of water may help. Contact your healthcare professional if the problem does not go away or is severe. Tell your healthcare professional right away if you have any change in your eyesight. If you take migraine medicines for 10 or more days a month, your migraines may get worse. Keep a diary of headache days and medicine use. Contact your healthcare professional if your migraine attacks occur more frequently. What side effects may I notice from receiving this medicine? Side effects that you should report to your doctor or health care professional as soon as possible:  allergic reactions like skin rash, itching or hives, swelling of the face, lips, or tongue  changes in vision  chest pain or chest tightness  signs and symptoms of a dangerous change in heartbeat or heart rhythm like chest pain; dizziness; fast, irregular heartbeat; palpitations; feeling faint or lightheaded; falls; breathing problems  signs and symptoms of a stroke like changes in vision; confusion; trouble speaking or understanding; severe headaches; sudden numbness or weakness of the face, arm or leg; trouble walking; dizziness; loss of balance or coordination  signs and symptoms of serotonin syndrome like irritable; confusion; diarrhea; fast or irregular heartbeat; muscle twitching; stiff muscles; trouble walking; sweating; high fever; seizures; chills; vomiting Side effects  that usually do not require medical attention (report to your doctor or health care professional if they continue or are bothersome):  changes in taste  diarrhea  dizziness  drowsiness  dry mouth  headache  nausea, vomiting  pain, tingling, numbness in the hands or feet  stomach pain This list may not describe all possible side effects. Call your doctor for medical advice about side effects. You may report side effects to FDA at 1-800-FDA-1088. Where should I keep my medicine? Keep out of the reach of children. Store at room temperature between 20 and 25 degrees C (68 and 77 degrees F). Throw away any unused medicine after the expiration date. NOTE: This sheet is a summary. It may not cover all possible information. If you have questions about this medicine, talk to your doctor, pharmacist, or health care provider.  2020 Elsevier/Gold Standard (2018-05-28 15:20:02)

## 2020-09-09 NOTE — Telephone Encounter (Signed)
[facility] Randall Hiss) called, SUMAtriptan Succinate (ZEMBRACE SYMTOUCH) 3 KZ/6.0FU SOAJ has conflicting directions on how often to repeat. Would like a call from the nurse.

## 2020-09-09 NOTE — Telephone Encounter (Signed)
I spoke with Dr Jaynee Eagles and e-scribed to [facility] the change in directions to [facility]. "Inject 3 mg into the skin every 1 hour as needed for migraine. Max 4 injections in one day."

## 2020-09-13 ENCOUNTER — Encounter: Payer: Self-pay | Admitting: Physician Assistant

## 2020-09-13 ENCOUNTER — Ambulatory Visit (INDEPENDENT_AMBULATORY_CARE_PROVIDER_SITE_OTHER): Payer: Federal, State, Local not specified - PPO | Admitting: Physician Assistant

## 2020-09-13 ENCOUNTER — Other Ambulatory Visit: Payer: Self-pay

## 2020-09-13 VITALS — BP 116/76 | HR 88 | Ht 65.0 in | Wt 194.4 lb

## 2020-09-13 DIAGNOSIS — Z1283 Encounter for screening for malignant neoplasm of skin: Secondary | ICD-10-CM | POA: Diagnosis not present

## 2020-09-13 DIAGNOSIS — E669 Obesity, unspecified: Secondary | ICD-10-CM

## 2020-09-13 DIAGNOSIS — E559 Vitamin D deficiency, unspecified: Secondary | ICD-10-CM

## 2020-09-13 DIAGNOSIS — Z1231 Encounter for screening mammogram for malignant neoplasm of breast: Secondary | ICD-10-CM | POA: Diagnosis not present

## 2020-09-13 DIAGNOSIS — Z0000 Encounter for general adult medical examination without abnormal findings: Secondary | ICD-10-CM | POA: Diagnosis not present

## 2020-09-13 DIAGNOSIS — Z23 Encounter for immunization: Secondary | ICD-10-CM

## 2020-09-13 DIAGNOSIS — Z808 Family history of malignant neoplasm of other organs or systems: Secondary | ICD-10-CM

## 2020-09-13 DIAGNOSIS — Z6832 Body mass index (BMI) 32.0-32.9, adult: Secondary | ICD-10-CM

## 2020-09-13 MED ORDER — SAXENDA 18 MG/3ML ~~LOC~~ SOPN: PEN_INJECTOR | SUBCUTANEOUS | 2 refills | Status: DC

## 2020-09-13 MED ORDER — ZEMBRACE SYMTOUCH 3 MG/0.5ML ~~LOC~~ SOAJ: SUBCUTANEOUS | 11 refills | Status: DC

## 2020-09-13 NOTE — Patient Instructions (Signed)

## 2020-09-13 NOTE — Telephone Encounter (Signed)
[facility] pharmacist called to clarify the prescription for Zembrace. I verbally provided the instructions to her and she updated them on their end. Her questions were answered and she verbalized appreciation.

## 2020-09-13 NOTE — Addendum Note (Signed)
Addended by: Gildardo Griffes on: 09/13/2020 02:05 PM   Modules accepted: Orders

## 2020-09-13 NOTE — Progress Notes (Signed)
Female Physical   Impression and Recommendations:    1. Healthcare maintenance   2. Encounter for screening mammogram for malignant neoplasm of breast   3. Need for shingles vaccine   4. Need for influenza vaccination   5. Skin cancer screening   6. Family history of basal cell carcinoma   7. Class 1 obesity with body mass index (BMI) of 32.0 to 32.9 in adult, unspecified obesity type, unspecified whether serious comorbidity present   8. Vitamin D deficiency      1) Anticipatory Guidance: Discussed skin CA prevention and sunscreen when outside along with skin surveillance; eating a balanced and modest diet; physical activity at least 25 minutes per day or minimum of 150 min/ week moderate to intense activity.  2) Immunizations / Screenings / Labs:   All immunizations are up-to-date per recommendations or will be updated today if pt allows.    - Patient understands with dental and vision screens they will schedule independently.  - Obtained CBC, CMP, HgA1c, Lipid panel, Vit D and TSH when fasting. Most labs were essentially within normal limits. Lipid panel shows triglycerides elevated. - UTD on pap smear, colonoscopy, Tdap - Declined Hep C and HIV screenings. - Will place mammogram order and agreeable to Influenza and Shingrix vaccines.   3) Weight: Discussed goal to improve diet habits to improve overall feelings of well being and objective health data. Improve nutrient density of diet through increasing intake of fruits and vegetables and decreasing saturated fats, white flour products and refined sugars. -Interested on starting weight loss medication as adjunct to diet changes. Plans to be more active. Has chronic knee pain which limits activity level. -Recommend to use MyFitnessPal or Lose It app.  4) Healthcare Maintenance: -Continue current medication regimen. Pt self discontinued Prozac several months and reports mood has been stable with Cymbalta. -Continue to follow up  with various specialists. -Placed dermatology referral. -Will start saxenda for weight loss. No hx of MEN or MTC.  Discussed with patient possible side effects and advised to let me know if unable to tolerate.  Follow-up in 4 weeks for weight management.  Recommend to follow Mediterranean diet. -Follow-up in 6 months for regular OV: Mood management   Meds ordered this encounter  Medications  . Liraglutide -Weight Management (SAXENDA) 18 MG/3ML SOPN    Sig: Inject 0.6 mg into the skin daily x 7 days. Then inject 1.2 mg into the skin daily x 7 days. Then inject 1.8 mg into the skin daily.    Dispense:  3 mL    Refill:  2    Order Specific Question:   Supervising Provider    Answer:   Beatrice Lecher D [[id number]]    Orders Placed This Encounter  Procedures  . MM Digital Screening  . Varicella-zoster vaccine IM  . Flu Vaccine QUAD 6+ mos PF IM (Fluarix Quad PF)  . Ambulatory referral to Dermatology     Return in about 6 months (around 03/14/2021) for Mood management; 4 weeks for wt management (ok for telehealth).    Gross side effects, risk and benefits, and alternatives of medications discussed with patient.  Patient is aware that all medications have potential side effects and we are unable to predict every side effect or drug-drug interaction that may occur.  Expresses verbal understanding and consents to current therapy plan and treatment regimen.  F-up preventative CPE in 1 year- this is in addition to any chronic care visits.    Please see orders placed and  AVS handed out to patient at the end of our visit for further patient instructions/ counseling done pertaining to today's office visit.  Note:  This note was prepared with assistance of Dragon voice recognition software. Occasional wrong-word or sound-a-like substitutions may have occurred due to the inherent limitations of voice recognition software.    Subjective:     CPE HPI: Christine Frank is a 54 y.o. female  who presents to [place] at [facility] today for a yearly health maintenance exam.   Health Maintenance Summary  - Reviewed and updated, unless pt declines services.  Last Cologuard or Colonoscopy:   02/08/2017- repeat in 5 years Tobacco History Reviewed:  Remus Loffler a smoker Alcohol and/or drug use:    No concerns; no use Exercise Habits: Currently not very active. Dental Home: Y Eye exams: Y Dermatology home: Placed referral.  Female Health:  PAP Smear - last known results:  09/10/2019- normal STD concerns:   none Lumps or breast concerns: none Breast Cancer Family History: No    Additional concerns beyond health maintenance issues:     Immunization History  Administered Date(s) Administered  . Influenza, Seasonal, Injecte, Preservative Fre 10/14/2015  . Influenza,inj,Quad PF,6+ Mos 12/07/2016, 09/10/2019, 09/13/2020  . Influenza,inj,quad, With Preservative 07/31/2019  . MMR 01/02/2005  . Tdap 12/07/2016  . Zoster Recombinat (Shingrix) 09/13/2020     Health Maintenance  Topic Date Due  . Hepatitis C Screening  Never done  . COVID-19 Vaccine (1) Never done  . HIV Screening  Never done  . MAMMOGRAM  05/27/2021  . COLONOSCOPY  02/21/2022  . PAP SMEAR-Modifier  09/09/2022  . TETANUS/TDAP  12/07/2026  . INFLUENZA VACCINE  Completed     Wt Readings from Last 3 Encounters:  09/13/20 194 lb 6.4 oz (88.2 kg)  09/09/20 197 lb (89.4 kg)  04/05/20 180 lb (81.6 kg)   BP Readings from Last 3 Encounters:  09/13/20 116/76  09/09/20 116/78  09/10/19 125/81   Pulse Readings from Last 3 Encounters:  09/13/20 88  09/09/20 97  09/10/19 (!) 102     Past Medical History:  Diagnosis Date  . Allergy   . Anemia   . Chronic hip pain   . Chronic leg pain    bilateral  . Depression   . Migraine   . PMDD (premenstrual dysphoric disorder)   . Restless leg   . S/P epidural steroid injection    affects legs is seen at pain clinic.      Past Surgical  History:  Procedure Laterality Date  . CERVICAL CERCLAGE    . CESAREAN SECTION     x 2  . CESAREAN SECTION  1994  . CESAREAN SECTION  1999  . KNEE SURGERY Right 11/27/2019   torn meniscus and torn cartilage repaired      Family History  Problem Relation Age of Onset  . Hypertension Father   . Diabetes Father   . Hyperlipidemia Father   . Alcohol abuse Maternal Grandmother   . Alcohol abuse Maternal Grandfather   . Heart attack Paternal Grandfather   . Cerebral aneurysm Mother   . Cerebral aneurysm Maternal Aunt   . Migraines Maternal Aunt   . Colon cancer Neg Hx   . Esophageal cancer Neg Hx   . Rectal cancer Neg Hx   . Stomach cancer Neg Hx       Social History   Substance and Sexual Activity  Drug Use No  ,   Social History  Substance and Sexual Activity  Alcohol Use Yes  . Alcohol/week: 0.0 standard drinks   Comment: Rare; 2-3 times per year socially  ,   Social History   Tobacco Use  Smoking Status Never Smoker  Smokeless Tobacco Never Used  Tobacco Comment   tried tobacco a few times as a teen, never picked up the habit  ,   Social History   Substance and Sexual Activity  Sexual Activity Yes  . Birth control/protection: None   Comment: 1st intercourse 55 yo-5 partners    Current Outpatient Medications on File Prior to Visit  Medication Sig Dispense Refill  . baclofen (LIORESAL) 10 MG tablet Take 1 tablet (10 mg total) by mouth 2 (two) times daily. Take 1/2 to 1 tablet 2 x day if needed for muscle spasm 60 tablet 0  . BIOTIN PO 10,000 mcg oral once daily    . butalbital-aspirin-caffeine (FIORINAL) 50-325-40 MG capsule TAKE 1 CAPSULE BY MOUTH EVERY 6 HOURS AS NEEDED FOR HEADACHE. 30 MUST LAST 90 DAYS 30 capsule 0  . cetirizine (ZYRTEC) 10 MG tablet TAKE 1 TABLET DAILY FOR ALLERGIES 90 tablet 3  . CRANBERRY PO Take by mouth daily. With vitamin C    . diclofenac sodium (VOLTAREN) 1 % GEL Apply 1 application topically 3 (three) times daily as  needed.  1  . docusate sodium (COLACE) 100 MG capsule Take 100 mg by mouth 2 (two) times daily.    . DULoxetine (CYMBALTA) 60 MG capsule Take 1 capsule (60 mg total) by mouth daily. 90 capsule 1  . Esomeprazole Magnesium (NEXIUM PO) Take 1 tablet by mouth daily.    . fluticasone (FLONASE) 50 MCG/ACT nasal spray INHALE 1-2 SPRAYS IN EACH NOSTRIL TWICE DAILY 48 mL 1  . gabapentin (NEURONTIN) 300 MG capsule TAKE 1 CAPSULE BY MOUTH THREE TIMES A DAY 270 capsule 3  . HYDROcodone-acetaminophen (NORCO) 10-325 MG tablet Take 1 tablet by mouth 4 (four) times daily.     Marland Kitchen ibuprofen (ADVIL) 800 MG tablet Take 1 tablet by mouth at bedtime.     Marland Kitchen ipratropium (ATROVENT) 0.03 % nasal spray PLACE 2 SPRAYS INTO THE NOSE 3 (THREE) TIMES DAILY. 90 mL 1  . loperamide (IMODIUM) 2 MG capsule Take 1 capsule (2 mg total) by mouth 4 (four) times daily as needed for diarrhea or loose stools. 12 capsule 0  . MELATONIN PO Take by mouth.    . morphine (MS CONTIN) 30 MG 12 hr tablet Take 1 tablet by mouth 2 (two) times daily.    . Multiple Vitamin (MULTIVITAMIN) capsule Take 1 capsule by mouth daily.    . promethazine (PHENERGAN) 25 MG tablet Take 1 tablet (25 mg total) by mouth every 6 (six) hours as needed for nausea or vomiting. 30 tablet 11  . VITAMIN D PO Take by mouth.    . zolmitriptan (ZOMIG) 5 MG nasal solution Place 1 spray into the nose 2 (two) times daily as needed for migraine. 6 each 0  . rizatriptan (MAXALT) 10 MG tablet TAKE 1 TABLET AT ONSET OF HEADACHE, MAY REPEAT ONCE IN 2 HOURS IF NEEDED (Patient not taking: Reported on 09/13/2020) 10 tablet 1  . SUMAtriptan Succinate (ZEMBRACE SYMTOUCH) 3 MG/0.5ML SOAJ Inject 3 mg into the skin every 1 hour as needed for migraine. Max 4 injections in one day. 6 mL 11   Current Facility-Administered Medications on File Prior to Visit  Medication Dose Route Frequency Provider Last Rate Last Admin  . 0.9 %  sodium  chloride infusion  500 mL Intravenous Continuous Nandigam,  Kavitha V, MD        Allergies: Sulfa antibiotics  Review of Systems: General:   Denies fever, chills, unexplained weight loss.  Optho/Auditory:   Denies visual changes, blurred vision/LOV Respiratory:   Denies SOB, DOE more than baseline levels.   Cardiovascular:   Denies chest pain, palpitations, new onset peripheral edema  Gastrointestinal:   Denies nausea, vomiting, diarrhea.  Genitourinary: Denies dysuria, freq/ urgency, flank pain or discharge from genitals.  Endocrine:     Denies hot or cold intolerance, polyuria, polydipsia. Musculoskeletal:   Denies unexplained myalgias, joint swelling, unexplained arthralgias. Skin:  Denies rash, bruising Neurological:     Denies dizziness, unexplained weakness, numbness  Psychiatric/Behavioral:   Denies mood changes, suicidal or homicidal ideations, hallucinations    Objective:    Blood pressure 116/76, pulse 88, height 5' 5" (1.651 m), weight 194 lb 6.4 oz (88.2 kg), SpO2 93 %. Body mass index is 32.35 kg/m. General Appearance:    Alert, cooperative, no distress, appears stated age  Head:    Normocephalic, without obvious abnormality, atraumatic  Eyes:    PERRL, conjunctiva/corneas clear, EOM's intact,both eyes  Ears:    Normal TM's and external ear canals, both ears  Nose:   Nares normal, septum midline, mucosa normal, no drainage    or sinus tenderness  Throat:   Lips w/o lesion, mucosa moist, and tongue normal; teeth and   gums normal  Neck:   Supple, symmetrical, trachea midline, no adenopathy;    thyroid:  no enlargement/tenderness/nodules; no carotid   bruit or JVD  Back:     Symmetric, no curvature, ROM normal, no CVA tenderness  Lungs:     Clear to auscultation bilaterally, respirations unlabored, no       Wh/ R/ R  Chest Wall:    No tenderness or gross deformity; normal excursion   Heart:    Regular rate and rhythm, S1 and S2 normal, no murmur  Breast Exam:    No tenderness, masses, or nipple abnormality b/l; no d/c   Abdomen:     Soft, non-tender, bowel sounds active all four quadrants, No   G/R/R, no masses, no organomegaly  Genitalia:   Deferred. No concerns/sxs.  Rectal:   Deferred. No concerns/sxs.  Extremities:   Extremities normal, atraumatic, no cyanosis or gross edema  Pulses:   2+ and symmetric all extremities  Skin:   Warm, dry, Skin color, texture, turgor normal, no obvious rashes  Psych: No HI/SI, judgement and insight good, Euthymic mood. Full Affect.  Neurologic:   CNII-XII grossly intact, normal strength, sensation and reflexes throughout

## 2020-09-14 DIAGNOSIS — M47816 Spondylosis without myelopathy or radiculopathy, lumbar region: Secondary | ICD-10-CM | POA: Diagnosis not present

## 2020-09-14 DIAGNOSIS — M7060 Trochanteric bursitis, unspecified hip: Secondary | ICD-10-CM | POA: Diagnosis not present

## 2020-09-14 DIAGNOSIS — M1612 Unilateral primary osteoarthritis, left hip: Secondary | ICD-10-CM | POA: Diagnosis not present

## 2020-09-14 DIAGNOSIS — G894 Chronic pain syndrome: Secondary | ICD-10-CM | POA: Diagnosis not present

## 2020-09-21 ENCOUNTER — Other Ambulatory Visit: Payer: Self-pay | Admitting: Neurology

## 2020-09-21 ENCOUNTER — Other Ambulatory Visit: Payer: Self-pay | Admitting: Adult Health

## 2020-09-21 DIAGNOSIS — M1612 Unilateral primary osteoarthritis, left hip: Secondary | ICD-10-CM | POA: Diagnosis not present

## 2020-09-22 NOTE — Telephone Encounter (Signed)
Last filled on 06/21/2020 #30 capsules. Pt also has been filling Morphine Sulfate ER 30 mg #60 and Hydrocodone-Acet 10-325 #120 monthly for the past several months. At last office visit 09/09/20 pt was prescribed Zembrace for acute migraines. Will send to Dr Jaynee Eagles evaluate for refill.

## 2020-09-28 ENCOUNTER — Other Ambulatory Visit: Payer: Self-pay | Admitting: Neurology

## 2020-09-29 ENCOUNTER — Telehealth: Payer: Self-pay | Admitting: Neurology

## 2020-09-29 DIAGNOSIS — G43709 Chronic migraine without aura, not intractable, without status migrainosus: Secondary | ICD-10-CM

## 2020-09-29 MED ORDER — ZEMBRACE SYMTOUCH 3 MG/0.5ML ~~LOC~~ SOAJ: SUBCUTANEOUS | 10 refills | Status: DC

## 2020-09-29 NOTE — Telephone Encounter (Signed)
[facility]/ [facility] called, need clarification on direction for SUMAtriptan Succinate (ZEMBRACE SYMTOUCH) 3 MG/0.5ML SOAJ.

## 2020-09-29 NOTE — Telephone Encounter (Signed)
I e-scribed the clarified Zembrace prescription to [place].

## 2020-09-29 NOTE — Telephone Encounter (Signed)
The medication directions are per v.o. Dr Jaynee Eagles: "Inject 3 mg into the skin every 1 hour as needed for migraine. Max 4 injections in one day."

## 2020-10-05 ENCOUNTER — Other Ambulatory Visit: Payer: Self-pay | Admitting: Neurology

## 2020-10-05 NOTE — Telephone Encounter (Signed)
I called the patient and LVM (ok per DPR) advising the reason behind why Dr Jaynee Eagles had declined to fill the Fiorinal going forward. Advised pt that she has ordered other alternative medications of Zomig nasal spray samples, Zembrace injection, and Rizatriptan oral. Also, studies have shown Butalbital medications are not good treatment for migraines. I left the office number for call back if pt has further questions.

## 2020-10-05 NOTE — Telephone Encounter (Signed)
Pt is requesting a refill for butalbital-aspirin-caffeine (FIORINAL) 50-325-40 MG capsule.  Pharmacy: [id number]

## 2020-10-12 ENCOUNTER — Other Ambulatory Visit: Payer: Self-pay | Admitting: Adult Health

## 2020-10-12 ENCOUNTER — Other Ambulatory Visit: Payer: Self-pay | Admitting: Physician Assistant

## 2020-10-12 DIAGNOSIS — M7060 Trochanteric bursitis, unspecified hip: Secondary | ICD-10-CM | POA: Diagnosis not present

## 2020-10-12 DIAGNOSIS — G894 Chronic pain syndrome: Secondary | ICD-10-CM | POA: Diagnosis not present

## 2020-10-12 DIAGNOSIS — M1612 Unilateral primary osteoarthritis, left hip: Secondary | ICD-10-CM | POA: Diagnosis not present

## 2020-10-12 DIAGNOSIS — G2581 Restless legs syndrome: Secondary | ICD-10-CM | POA: Diagnosis not present

## 2020-10-12 DIAGNOSIS — M47816 Spondylosis without myelopathy or radiculopathy, lumbar region: Secondary | ICD-10-CM | POA: Diagnosis not present

## 2020-10-12 DIAGNOSIS — F32A Depression, unspecified: Secondary | ICD-10-CM

## 2020-10-12 DIAGNOSIS — Z79891 Long term (current) use of opiate analgesic: Secondary | ICD-10-CM | POA: Diagnosis not present

## 2020-10-12 DIAGNOSIS — Z79899 Other long term (current) drug therapy: Secondary | ICD-10-CM | POA: Diagnosis not present

## 2020-10-12 MED ORDER — FLUTICASONE PROPIONATE 50 MCG/ACT NA SUSP
1.0000 | Freq: Every day | NASAL | 1 refills | Status: DC
Start: 2020-10-12 — End: 2020-12-15

## 2020-11-03 ENCOUNTER — Telehealth: Payer: Self-pay | Admitting: Physician Assistant

## 2020-11-03 NOTE — Telephone Encounter (Signed)
Patient left a message where she might have possibly been exposed to [place]. She said if it is not COVID she believes it might be a sinus infection. Thanks

## 2020-11-03 NOTE — Telephone Encounter (Signed)
Pt states that she has had what she believed to be a sinus infection x 2 weeks.  However, 2 days ago, she began to feel "much worse" and is going to get a COVID test done tomorrow.  Advised pt that if COVID test is negative, she may have an in office visit for possible sinusitis.  Gave pt an appt for 11/09/2020 with Traevon Meiring Chimes.  However, pt understands that if she tests positive for COVID, she cannot be seen in the office.  Patient also stated that if her test is negative, she may be seen at [facility] prior to her appt if she feels she cannot wait until the 14th.  Charyl Bigger, CMA

## 2020-11-04 DIAGNOSIS — Z20822 Contact with and (suspected) exposure to covid-19: Secondary | ICD-10-CM | POA: Diagnosis not present

## 2020-11-09 ENCOUNTER — Ambulatory Visit: Payer: Self-pay | Admitting: Physician Assistant

## 2020-11-30 DIAGNOSIS — M1612 Unilateral primary osteoarthritis, left hip: Secondary | ICD-10-CM | POA: Diagnosis not present

## 2020-11-30 DIAGNOSIS — M47816 Spondylosis without myelopathy or radiculopathy, lumbar region: Secondary | ICD-10-CM | POA: Diagnosis not present

## 2020-11-30 DIAGNOSIS — G894 Chronic pain syndrome: Secondary | ICD-10-CM | POA: Diagnosis not present

## 2020-11-30 DIAGNOSIS — M7060 Trochanteric bursitis, unspecified hip: Secondary | ICD-10-CM | POA: Diagnosis not present

## 2020-12-04 ENCOUNTER — Other Ambulatory Visit: Payer: Self-pay | Admitting: Neurology

## 2020-12-15 ENCOUNTER — Other Ambulatory Visit: Payer: Self-pay | Admitting: Physician Assistant

## 2020-12-15 DIAGNOSIS — E669 Obesity, unspecified: Secondary | ICD-10-CM

## 2020-12-15 DIAGNOSIS — Z6832 Body mass index (BMI) 32.0-32.9, adult: Secondary | ICD-10-CM

## 2020-12-22 ENCOUNTER — Other Ambulatory Visit: Payer: Self-pay | Admitting: Occupational Medicine

## 2020-12-22 ENCOUNTER — Ambulatory Visit: Payer: Self-pay

## 2020-12-22 ENCOUNTER — Other Ambulatory Visit: Payer: Self-pay

## 2020-12-22 DIAGNOSIS — M79661 Pain in right lower leg: Secondary | ICD-10-CM

## 2020-12-27 ENCOUNTER — Encounter: Payer: Self-pay | Admitting: Physician Assistant

## 2020-12-28 DIAGNOSIS — Z79891 Long term (current) use of opiate analgesic: Secondary | ICD-10-CM | POA: Diagnosis not present

## 2020-12-28 DIAGNOSIS — G894 Chronic pain syndrome: Secondary | ICD-10-CM | POA: Diagnosis not present

## 2020-12-28 DIAGNOSIS — Z79899 Other long term (current) drug therapy: Secondary | ICD-10-CM | POA: Diagnosis not present

## 2020-12-28 DIAGNOSIS — M1612 Unilateral primary osteoarthritis, left hip: Secondary | ICD-10-CM | POA: Diagnosis not present

## 2021-01-03 ENCOUNTER — Other Ambulatory Visit: Payer: Self-pay | Admitting: Physician Assistant

## 2021-01-03 DIAGNOSIS — F32A Depression, unspecified: Secondary | ICD-10-CM

## 2021-01-25 DIAGNOSIS — G894 Chronic pain syndrome: Secondary | ICD-10-CM | POA: Diagnosis not present

## 2021-01-25 DIAGNOSIS — Z79891 Long term (current) use of opiate analgesic: Secondary | ICD-10-CM | POA: Diagnosis not present

## 2021-01-25 DIAGNOSIS — M7060 Trochanteric bursitis, unspecified hip: Secondary | ICD-10-CM | POA: Diagnosis not present

## 2021-01-25 DIAGNOSIS — Z79899 Other long term (current) drug therapy: Secondary | ICD-10-CM | POA: Diagnosis not present

## 2021-01-25 DIAGNOSIS — M1612 Unilateral primary osteoarthritis, left hip: Secondary | ICD-10-CM | POA: Diagnosis not present

## 2021-01-25 DIAGNOSIS — M25569 Pain in unspecified knee: Secondary | ICD-10-CM | POA: Diagnosis not present

## 2021-02-03 ENCOUNTER — Ambulatory Visit: Payer: Federal, State, Local not specified - PPO | Admitting: Dermatology

## 2021-02-09 ENCOUNTER — Ambulatory Visit (INDEPENDENT_AMBULATORY_CARE_PROVIDER_SITE_OTHER): Payer: Federal, State, Local not specified - PPO | Admitting: Physician Assistant

## 2021-02-09 VITALS — BP 120/75 | HR 92 | Temp 97.6°F | Ht 65.0 in | Wt 183.0 lb

## 2021-02-09 DIAGNOSIS — F32A Depression, unspecified: Secondary | ICD-10-CM | POA: Diagnosis not present

## 2021-02-09 DIAGNOSIS — K219 Gastro-esophageal reflux disease without esophagitis: Secondary | ICD-10-CM

## 2021-02-09 DIAGNOSIS — E669 Obesity, unspecified: Secondary | ICD-10-CM

## 2021-02-09 DIAGNOSIS — N393 Stress incontinence (female) (male): Secondary | ICD-10-CM | POA: Diagnosis not present

## 2021-02-09 MED ORDER — DULOXETINE HCL 60 MG PO CPEP: 60.0000 mg | ORAL_CAPSULE | Freq: Every day | ORAL | 1 refills | Status: DC

## 2021-02-09 MED ORDER — OZEMPIC (0.25 OR 0.5 MG/DOSE) 2 MG/1.5ML ~~LOC~~ SOPN: PEN_INJECTOR | SUBCUTANEOUS | 3 refills | Status: DC

## 2021-02-09 MED ORDER — PANTOPRAZOLE SODIUM 20 MG PO TBEC: 20.0000 mg | DELAYED_RELEASE_TABLET | Freq: Every day | ORAL | 2 refills | Status: DC

## 2021-02-09 NOTE — Progress Notes (Signed)
Telehealth office visit note for Christine Reid, PA-C- at Primary Care at [facility]   I connected with current patient today by telephone and verified that I am speaking with the correct person   . Location of the patient: Home . Location of the provider: Office - This visit type was conducted due to national recommendations for restrictions regarding the COVID-19 Pandemic (e.g. social distancing) in an effort to limit this patient's exposure and mitigate transmission in our community.    - No physical exam could be performed with this format, beyond that communicated to [place] by the patient/ family members as noted.   - Additionally my office staff/ schedulers were to discuss with the patient that there may be a monetary charge related to this service, depending on their medical insurance.  My understanding is that patient understood and consented to proceed.     _________________________________________________________________________________   History of Present Illness: Patient calls in to follow up on mood management. Reports mood is doing fine. Feels better and wants to lose weight so she can be more active and be able to enjoy with her grandchildren. States [name] was too expensive and not covered by her insurance. Is inquiring about alternatives. Patient is not able to exercise as much due to joint pain and knows losing weight will help improve her pain level. Does not over eat but eats what she craves which is usually not the healthiest. Needs help getting started. Also has complaints of acid reflux and stress incontinence. Has tired OTC Prilosec and Nexium with minimal relief. Wakes up and feels acid reflux which triggers a cough and phlegm. No specific foods trigger her symptoms. Has noticed not having a bowel movement with worsen her acid reflux. With stress incontinence, does not fully empty her bladder and also has urinary leakage when coughing or sneezing.      No  flowsheet data found.  Depression screen [facility] 2/9 02/09/2021 02/09/2021 09/13/2020 04/05/2020 09/10/2019  Decreased Interest 0 0 0 0 0  Down, Depressed, Hopeless 0 0 0 0 0  PHQ - 2 Score 0 0 0 0 0  Altered sleeping 0 - 0 1 0  Tired, decreased energy 1 - 1 1 0  Change in appetite 3 - 1 0 3  Feeling bad or failure about yourself  1 - 1 0 1  Trouble concentrating 0 - 0 0 0  Moving slowly or fidgety/restless 0 - 0 0 0  Suicidal thoughts 0 - 0 0 0  PHQ-9 Score 5 - 3 2 4   Difficult doing work/chores Somewhat difficult - Not difficult at all Not difficult at all Not difficult at all  Some recent data might be hidden      Impression and Recommendations:     1. Depression, unspecified depression type   2. Obesity, Class I, BMI 30-34.9   3. Gastroesophageal reflux disease, unspecified whether esophagitis present   4. Stress incontinence     Depression, unspecified type: -PHQ-9 score of 5, stable. Denies SI/HI. -Continue current medication regimen. Provided refill. -Will continue to monitor.  Obesity, Class I, BMI 30-34.9: -Associated with hypertriglyceridemia. -Discussed with patient alternative treatment therapies and wants to trial Ozempic. Advised if not covered/aprroved by insurance recommend contacting her insurance to inquire which medications are covered. -Encourage to start making diet changes such as decreasing carbohydrates. Advised to follow a Mediterranean diet, use My Fitness Pal or Lose It app. -Follow up in 4 weeks for wt management once started  medication.  GERD: -Patient has failed omeprazole and esomeprazole so will trial pantoprazole 20 mg. -Recommend to avoid potential provocative foods such as caffeine, spicy foods etc.  Stress incontinence: -Discussed with patient management options and is agreeable to referral to Urology for pelvic floor therapy.   - As part of my medical decision making, I reviewed the following data within the [place]  History obtained from pt /family, CMA notes reviewed and incorporated if applicable, Labs reviewed, Radiograph/ tests reviewed if applicable and OV notes from prior OV's with me, as well as any other specialists she/he has seen since seeing me last, were all reviewed and used in my medical decision making process today.    - Additionally, when appropriate, discussion had with patient regarding our treatment plan, and their biases/concerns about that plan were used in my medical decision making today.    - The patient agreed with the plan and demonstrated an understanding of the instructions.   No barriers to understanding were identified.     - The patient was advised to call back or seek an in-person evaluation if the symptoms worsen or if the condition fails to improve as anticipated.   Return in about 4 weeks (around 03/09/2021) for Wt; chronic visit for Mood, GERD in 4 months .    Orders Placed This Encounter  Procedures  . Ambulatory referral to Urology    Meds ordered this encounter  Medications  . Semaglutide,0.25 or 0.5MG /DOS, (OZEMPIC, 0.25 OR 0.5 MG/DOSE,) 2 MG/1.5ML SOPN    Sig: Inject 0.25 mg [place] once weekly x 4 weeks. Then inject 0.5 mg once weekly.    Dispense:  1.5 mL    Refill:  3    Order Specific Question:   Supervising Provider    Answer:   Beatrice Lecher D [[id number]]  . DULoxetine (CYMBALTA) 60 MG capsule    Sig: Take 1 capsule (60 mg total) by mouth daily.    Dispense:  90 capsule    Refill:  1    Order Specific Question:   Supervising Provider    Answer:   Beatrice Lecher D [[id number]]  . pantoprazole (PROTONIX) 20 MG tablet    Sig: Take 1 tablet (20 mg total) by mouth daily.    Dispense:  30 tablet    Refill:  2    Order Specific Question:   Supervising Provider    Answer:   Beatrice Lecher D [[id number]]    Medications Discontinued During This Encounter  Medication Reason  . ipratropium (ATROVENT) 0.03 % nasal spray Patient Preference  . loperamide  (IMODIUM) 2 MG capsule Patient Preference  . zolmitriptan (ZOMIG) 5 MG nasal solution Patient Preference  . Esomeprazole Magnesium (NEXIUM PO) Change in therapy  . DULoxetine (CYMBALTA) 60 MG capsule Reorder  . Liraglutide -Weight Management (SAXENDA) 18 MG/3ML SOPN Cost of medication       Time spent on telephone encounter was 22 minutes.    The [place] was signed into law in 2016 which includes the topic of electronic health records.  This provides immediate access to information in MyChart.  This includes consultation notes, operative notes, office notes, lab results and pathology reports.  If you have any questions about what you read please let us know at your next visit or call us at the office.  We are right here with you.   __________________________________________________________________________________     Patient Care Team    Relationship Specialty Notifications Start End  Christine Reid,  PA-C PCP - General Physician Assistant  04/05/20   Marlou Sa, Tonna Corner, MD Consulting Physician Orthopedic Surgery  12/07/16   Renie Ora, MD Referring Physician Anesthesiology  12/07/16   Anastasio Auerbach, MD (Inactive) Consulting Physician Gynecology  12/07/16      -Vitals obtained; medications/ allergies reconciled;  personal medical, social, Sx etc.histories were updated by CMA, reviewed by me and are reflected in chart   Patient Active Problem List   Diagnosis Date Noted  . Pain in right knee 10/06/2019  . Blood glucose elevated 09/10/2019  . Chronic migraine without aura without status migrainosus, not intractable 03/26/2019  . Dysuria 07/18/2018  . Depression 05/16/2018  . Grief 05/16/2018  . Vitamin D deficiency 11/08/2017  . Screening for cervical cancer 11/08/2017  . Healthcare maintenance 07/05/2017  . Menstrual migraine with status migrainosus, not intractable 07/05/2017  . Back pain of lumbar region with sciatica 07/05/2017  . Varicose veins  of lower extremities with other complications 78/67/6720     Current Meds  Medication Sig  . baclofen (LIORESAL) 10 MG tablet Take 1 tablet (10 mg total) by mouth 2 (two) times daily. Take 1/2 to 1 tablet 2 x day if needed for muscle spasm  . BIOTIN PO 10,000 mcg oral once daily  . butalbital-aspirin-caffeine (FIORINAL) 50-325-40 MG capsule TAKE 1 CAPSULE BY MOUTH EVERY 6 HOURS AS NEEDED FOR HEADACHE. 30 MUST LAST 90 DAYS  . cetirizine (ZYRTEC) 10 MG tablet TAKE 1 TABLET DAILY FOR ALLERGIES  . CRANBERRY PO Take by mouth daily. With vitamin C  . diclofenac sodium (VOLTAREN) 1 % GEL Apply 1 application topically 3 (three) times daily as needed.  . docusate sodium (COLACE) 100 MG capsule Take 100 mg by mouth 2 (two) times daily.  . fluticasone (FLONASE) 50 MCG/ACT nasal spray SPRAY 1 SPRAY INTO BOTH NOSTRILS DAILY.  Marland Kitchen gabapentin (NEURONTIN) 300 MG capsule TAKE 1 CAPSULE BY MOUTH THREE TIMES A DAY  . HYDROcodone-acetaminophen (NORCO) 10-325 MG tablet Take 1 tablet by mouth 4 (four) times daily.   Marland Kitchen ibuprofen (ADVIL) 800 MG tablet Take 1 tablet by mouth at bedtime.   Marland Kitchen MELATONIN PO Take by mouth.  . morphine (MS CONTIN) 30 MG 12 hr tablet Take 1 tablet by mouth 2 (two) times daily.  . Multiple Vitamin (MULTIVITAMIN) capsule Take 1 capsule by mouth daily.  . pantoprazole (PROTONIX) 20 MG tablet Take 1 tablet (20 mg total) by mouth daily.  . promethazine (PHENERGAN) 25 MG tablet Take 1 tablet (25 mg total) by mouth every 6 (six) hours as needed for nausea or vomiting.  . rizatriptan (MAXALT) 10 MG tablet TAKE 1 TABLET AT ONSET OF HEADACHE, MAY REPEAT ONCE IN 2 HOURS IF NEEDED  . Semaglutide,0.25 or 0.5MG /DOS, (OZEMPIC, 0.25 OR 0.5 MG/DOSE,) 2 MG/1.5ML SOPN Inject 0.25 mg [place] once weekly x 4 weeks. Then inject 0.5 mg once weekly.  . SUMAtriptan Succinate (ZEMBRACE SYMTOUCH) 3 MG/0.5ML SOAJ Inject 3 mg into the skin every 1 hour as needed for migraine. Max 4 injections in one day.  Marland Kitchen VITAMIN D PO Take  by mouth.  . [DISCONTINUED] DULoxetine (CYMBALTA) 60 MG capsule Take 1 capsule (60 mg total) by mouth daily. **NEEDS APT FOR REFILLS**  . [DISCONTINUED] Esomeprazole Magnesium (NEXIUM PO) Take 1 tablet by mouth daily.  . [DISCONTINUED] ipratropium (ATROVENT) 0.03 % nasal spray PLACE 2 SPRAYS INTO THE NOSE 3 (THREE) TIMES DAILY.  . [DISCONTINUED] Liraglutide -Weight Management (SAXENDA) 18 MG/3ML SOPN Inject 0.6 mg into the skin daily  x 7 days. Then inject 1.2 mg into the skin daily x 7 days. Then inject 1.8 mg into the skin daily.  . [DISCONTINUED] loperamide (IMODIUM) 2 MG capsule Take 1 capsule (2 mg total) by mouth 4 (four) times daily as needed for diarrhea or loose stools.  . [DISCONTINUED] zolmitriptan (ZOMIG) 5 MG nasal solution Place 1 spray into the nose 2 (two) times daily as needed for migraine.   Current Facility-Administered Medications for the 02/09/21 encounter (Office Visit) with Christine Reid, PA-C  Medication  . 0.9 %  sodium chloride infusion     Allergies:  Allergies  Allergen Reactions  . Sulfa Antibiotics Swelling and Rash     ROS:  See above HPI for pertinent positives and negatives   Objective:   Blood pressure 120/75, pulse 92, temperature 97.6 F (36.4 C), height 5\' 5"  (1.651 m), weight 183 lb (83 kg).  (if some vitals are omitted, this means that patient was UNABLE to obtain them.) General: A & O * 3; sounds in no acute distress Respiratory: speaking in full sentences, no conversational dyspnea Psych: insight appears good, mood- appears full

## 2021-02-09 NOTE — Patient Instructions (Signed)
Mediterranean Diet A Mediterranean diet refers to food and lifestyle choices that are based on the traditions of countries located on the [place]. This way of eating has been shown to help prevent certain conditions and improve outcomes for people who have chronic diseases, like kidney disease and heart disease. What are tips for following this plan? Lifestyle  Cook and eat meals together with your family, when possible.  Drink enough fluid to keep your urine clear or pale yellow.  Be physically active every day. This includes: ? Aerobic exercise like running or swimming. ? Leisure activities like gardening, walking, or housework.  Get 7-8 hours of sleep each night.  If recommended by your health care provider, drink red wine in moderation. This means 1 glass a day for nonpregnant women and 2 glasses a day for men. A glass of wine equals 5 oz (150 mL). Reading food labels  Check the serving size of packaged foods. For foods such as rice and pasta, the serving size refers to the amount of cooked product, not dry.  Check the total fat in packaged foods. Avoid foods that have saturated fat or trans fats.  Check the ingredients list for added sugars, such as corn syrup.   Shopping  At the grocery store, buy most of your food from the areas near the walls of the store. This includes: ? Fresh fruits and vegetables (produce). ? Grains, beans, nuts, and seeds. Some of these may be available in unpackaged forms or large amounts (in bulk). ? Fresh seafood. ? Poultry and eggs. ? Low-fat dairy products.  Buy whole ingredients instead of prepackaged foods.  Buy fresh fruits and vegetables in-season from local farmers markets.  Buy frozen fruits and vegetables in resealable bags.  If you do not have access to quality fresh seafood, buy precooked frozen shrimp or canned fish, such as tuna, salmon, or sardines.  Buy small amounts of raw or cooked vegetables, salads, or olives from  the deli or salad bar at your store.  Stock your pantry so you always have certain foods on hand, such as olive oil, canned tuna, canned tomatoes, rice, pasta, and beans. Cooking  Cook foods with extra-virgin olive oil instead of using butter or other vegetable oils.  Have meat as a side dish, and have vegetables or grains as your main dish. This means having meat in small portions or adding small amounts of meat to foods like pasta or stew.  Use beans or vegetables instead of meat in common dishes like chili or lasagna.  Experiment with different cooking methods. Try roasting or broiling vegetables instead of steaming or sauteing them.  Add frozen vegetables to soups, stews, pasta, or rice.  Add nuts or seeds for added healthy fat at each meal. You can add these to yogurt, salads, or vegetable dishes.  Marinate fish or vegetables using olive oil, lemon juice, garlic, and fresh herbs. Meal planning  Plan to eat 1 vegetarian meal one day each week. Try to work up to 2 vegetarian meals, if possible.  Eat seafood 2 or more times a week.  Have healthy snacks readily available, such as: ? Vegetable sticks with hummus. ? Greek yogurt. ? Fruit and nut trail mix.  Eat balanced meals throughout the week. This includes: ? Fruit: 2-3 servings a day ? Vegetables: 4-5 servings a day ? Low-fat dairy: 2 servings a day ? Fish, poultry, or lean meat: 1 serving a day ? Beans and legumes: 2 or more servings a week ?   Nuts and seeds: 1-2 servings a day ? Whole grains: 6-8 servings a day ? Extra-virgin olive oil: 3-4 servings a day  Limit red meat and sweets to only a few servings a month   What are my food choices?  Mediterranean diet ? Recommended  Grains: Whole-grain pasta. Brown rice. Bulgar wheat. Polenta. Couscous. Whole-wheat bread. Oatmeal. Quinoa.  Vegetables: Artichokes. Beets. Broccoli. Cabbage. Carrots. Eggplant. Green beans. Chard. Kale. Spinach. Onions. Leeks. Peas. Squash.  Tomatoes. Peppers. Radishes.  Fruits: Apples. Apricots. Avocado. Berries. Bananas. Cherries. Dates. Figs. Grapes. Lemons. Melon. Oranges. Peaches. Plums. Pomegranate.  Meats and other protein foods: Beans. Almonds. Sunflower seeds. Pine nuts. Peanuts. Cod. Salmon. Scallops. Shrimp. Tuna. Tilapia. Clams. Oysters. Eggs.  Dairy: Low-fat milk. Cheese. Greek yogurt.  Beverages: Water. Red wine. Herbal tea.  Fats and oils: Extra virgin olive oil. Avocado oil. Grape seed oil.  Sweets and desserts: Greek yogurt with honey. Baked apples. Poached pears. Trail mix.  Seasoning and other foods: Basil. Cilantro. Coriander. Cumin. Mint. Parsley. Sage. Rosemary. Tarragon. Garlic. Oregano. Thyme. Pepper. Balsalmic vinegar. Tahini. Hummus. Tomato sauce. Olives. Mushrooms. ? Limit these  Grains: Prepackaged pasta or rice dishes. Prepackaged cereal with added sugar.  Vegetables: Deep fried potatoes (french fries).  Fruits: Fruit canned in syrup.  Meats and other protein foods: Beef. Pork. Lamb. Poultry with skin. Hot dogs. Bacon.  Dairy: Ice cream. Sour cream. Whole milk.  Beverages: Juice. Sugar-sweetened soft drinks. Beer. Liquor and spirits.  Fats and oils: Butter. Canola oil. Vegetable oil. Beef fat (tallow). Lard.  Sweets and desserts: Cookies. Cakes. Pies. Candy.  Seasoning and other foods: Mayonnaise. Premade sauces and marinades. The items listed may not be a complete list. Talk with your dietitian about what dietary choices are right for you. Summary  The Mediterranean diet includes both food and lifestyle choices.  Eat a variety of fresh fruits and vegetables, beans, nuts, seeds, and whole grains.  Limit the amount of red meat and sweets that you eat.  Talk with your health care provider about whether it is safe for you to drink red wine in moderation. This means 1 glass a day for nonpregnant women and 2 glasses a day for men. A glass of wine equals 5 oz (150 mL). This information  is not intended to replace advice given to you by your health care provider. Make sure you discuss any questions you have with your health care provider. Document Revised: 07/13/2016 Document Reviewed: 07/06/2016 Elsevier Patient Education  2020 [employer]  

## 2021-02-11 ENCOUNTER — Other Ambulatory Visit: Payer: Self-pay | Admitting: Neurology

## 2021-02-22 DIAGNOSIS — G894 Chronic pain syndrome: Secondary | ICD-10-CM | POA: Diagnosis not present

## 2021-02-22 DIAGNOSIS — M1612 Unilateral primary osteoarthritis, left hip: Secondary | ICD-10-CM | POA: Diagnosis not present

## 2021-02-22 DIAGNOSIS — M47816 Spondylosis without myelopathy or radiculopathy, lumbar region: Secondary | ICD-10-CM | POA: Diagnosis not present

## 2021-02-22 DIAGNOSIS — M7060 Trochanteric bursitis, unspecified hip: Secondary | ICD-10-CM | POA: Diagnosis not present

## 2021-03-15 ENCOUNTER — Ambulatory Visit: Payer: Federal, State, Local not specified - PPO | Admitting: Physician Assistant

## 2021-03-21 ENCOUNTER — Ambulatory Visit: Payer: Federal, State, Local not specified - PPO | Admitting: Physician Assistant

## 2021-03-23 DIAGNOSIS — M7061 Trochanteric bursitis, right hip: Secondary | ICD-10-CM | POA: Diagnosis not present

## 2021-03-23 DIAGNOSIS — M5136 Other intervertebral disc degeneration, lumbar region: Secondary | ICD-10-CM | POA: Diagnosis not present

## 2021-03-23 DIAGNOSIS — M1711 Unilateral primary osteoarthritis, right knee: Secondary | ICD-10-CM | POA: Diagnosis not present

## 2021-03-23 DIAGNOSIS — G894 Chronic pain syndrome: Secondary | ICD-10-CM | POA: Diagnosis not present

## 2021-03-23 DIAGNOSIS — M1612 Unilateral primary osteoarthritis, left hip: Secondary | ICD-10-CM | POA: Diagnosis not present

## 2021-03-26 ENCOUNTER — Encounter: Payer: Self-pay | Admitting: Physician Assistant

## 2021-03-27 ENCOUNTER — Encounter: Payer: Self-pay | Admitting: Emergency Medicine

## 2021-03-27 ENCOUNTER — Other Ambulatory Visit: Payer: Self-pay

## 2021-03-27 ENCOUNTER — Other Ambulatory Visit: Payer: Self-pay | Admitting: Physician Assistant

## 2021-03-27 ENCOUNTER — Ambulatory Visit
Admission: EM | Admit: 2021-03-27 | Discharge: 2021-03-27 | Disposition: A | Discharge status: Home / Self Care | Payer: Federal, State, Local not specified - PPO | Attending: Emergency Medicine | Admitting: Emergency Medicine

## 2021-03-27 DIAGNOSIS — J069 Acute upper respiratory infection, unspecified: Secondary | ICD-10-CM | POA: Diagnosis not present

## 2021-03-27 DIAGNOSIS — Z20822 Contact with and (suspected) exposure to covid-19: Secondary | ICD-10-CM | POA: Diagnosis not present

## 2021-03-27 MED ORDER — FLUTICASONE PROPIONATE 50 MCG/ACT NA SUSP: 1.0000 | Freq: Every day | NASAL | 0 refills | Status: DC

## 2021-03-27 MED ORDER — CETIRIZINE HCL 10 MG PO CAPS: 10.0000 mg | ORAL_CAPSULE | Freq: Every day | ORAL | 0 refills | Status: AC

## 2021-03-27 MED ORDER — IBUPROFEN 600 MG PO TABS: 600.0000 mg | ORAL_TABLET | Freq: Four times a day (QID) | ORAL | 0 refills | Status: AC | PRN

## 2021-03-27 NOTE — ED Triage Notes (Signed)
Pt sts fever Thursday not but not again since now having sweats and chills

## 2021-03-27 NOTE — Discharge Instructions (Signed)
COVID test pending, monitor MyChart for results Begin daily cetirizine and Flonase to help with congestion, sinus pressure and postnasal drainage Ibuprofen and Tylenol for headaches, body aches, fevers Rest and fluids Follow-up if not improving or worsening

## 2021-03-27 NOTE — ED Provider Notes (Signed)
[facility]    CSN: [id number] Arrival date & time: 03/27/21  1417      History   Chief Complaint Chief Complaint  Patient presents with  . Fever  . Chills    HPI Christine Frank is a 55 y.o. female presenting today for evaluation of fevers and hot and cold chills.  Patient reports that she noted to have a fever on Thursday, and since she has had hot and cold chills.  Does report some congestion and throat irritation.  Denies significant cough.  Denies any GI symptoms.  Denies close sick contacts.  HPI  Past Medical History:  Diagnosis Date  . Allergy   . Anemia   . Chronic hip pain   . Chronic leg pain    bilateral  . Depression   . Migraine   . PMDD (premenstrual dysphoric disorder)   . Restless leg   . S/P epidural steroid injection    affects legs is seen at pain clinic.    Patient Active Problem List   Diagnosis Date Noted  . Pain in right knee 10/06/2019  . Blood glucose elevated 09/10/2019  . Chronic migraine without aura without status migrainosus, not intractable 03/26/2019  . Dysuria 07/18/2018  . Depression 05/16/2018  . Grief 05/16/2018  . Vitamin D deficiency 11/08/2017  . Screening for cervical cancer 11/08/2017  . Healthcare maintenance 07/05/2017  . Menstrual migraine with status migrainosus, not intractable 07/05/2017  . Back pain of lumbar region with sciatica 07/05/2017  . Varicose veins of lower extremities with other complications 97/12/6376    Past Surgical History:  Procedure Laterality Date  . CERVICAL CERCLAGE    . CESAREAN SECTION     x 2  . CESAREAN SECTION  1994  . CESAREAN SECTION  1999  . KNEE SURGERY Right 11/27/2019   torn meniscus and torn cartilage repaired    OB History    Gravida  10   Para  2   Term  2   Preterm      AB  8   Living  2     SAB  8   IAB      Ectopic      Multiple      Live Births               Home Medications    Prior to Admission medications    Medication Sig Start Date End Date Taking? Authorizing Provider  Cetirizine HCl 10 MG CAPS Take 1 capsule (10 mg total) by mouth daily for 10 days. 03/27/21 04/06/21 Yes Chau Savell C, PA-C  fluticasone (FLONASE) 50 MCG/ACT nasal spray Place 1-2 sprays into both nostrils daily. 03/27/21  Yes Aniruddh Ciavarella C, PA-C  ibuprofen (ADVIL) 600 MG tablet Take 1 tablet (600 mg total) by mouth every 6 (six) hours as needed. 03/27/21  Yes Taedyn Glasscock C, PA-C  baclofen (LIORESAL) 10 MG tablet Take 1 tablet (10 mg total) by mouth 2 (two) times daily. Take 1/2 to 1 tablet 2 x day if needed for muscle spasm 10/14/15   Vladimir Crofts, PA-C  BIOTIN PO 10,000 mcg oral once daily    [provider]  butalbital-aspirin-caffeine (FIORINAL) 50-325-40 MG capsule TAKE 1 CAPSULE BY MOUTH EVERY 6 HOURS AS NEEDED FOR HEADACHE. 30 MUST LAST 90 DAYS 06/17/20   Melvenia Beam, MD  CRANBERRY PO Take by mouth daily. With vitamin C    [provider]  diclofenac sodium (VOLTAREN) 1 % GEL  Apply 1 application topically 3 (three) times daily as needed. 08/30/17   [provider]  docusate sodium (COLACE) 100 MG capsule Take 100 mg by mouth 2 (two) times daily.    [provider]  DULoxetine (CYMBALTA) 60 MG capsule Take 1 capsule (60 mg total) by mouth daily. 02/09/21   Lorrene Reid, PA-C  gabapentin (NEURONTIN) 300 MG capsule TAKE 1 CAPSULE BY MOUTH THREE TIMES A DAY 09/09/20   Melvenia Beam, MD  HYDROcodone-acetaminophen (NORCO) 10-325 MG tablet Take 1 tablet by mouth 4 (four) times daily.  11/30/16   [provider]  MELATONIN PO Take by mouth.    [provider]  morphine (MS CONTIN) 30 MG 12 hr tablet Take 1 tablet by mouth 2 (two) times daily. 12/12/18   [provider]  Multiple Vitamin (MULTIVITAMIN) capsule Take 1 capsule by mouth daily.    [provider]  pantoprazole (PROTONIX) 20 MG tablet Take 1 tablet (20 mg total) by mouth daily. 02/09/21    Lorrene Reid, PA-C  promethazine (PHENERGAN) 25 MG tablet Take 1 tablet (25 mg total) by mouth every 6 (six) hours as needed for nausea or vomiting. 09/09/20   Melvenia Beam, MD  rizatriptan (MAXALT) 10 MG tablet TAKE 1 TABLET AT ONSET OF HEADACHE, MAY REPEAT ONCE IN 2 HOURS IF NEEDED 02/11/21   Melvenia Beam, MD  Semaglutide,0.25 or 0.5MG /DOS, (OZEMPIC, 0.25 OR 0.5 MG/DOSE,) 2 MG/1.5ML SOPN Inject 0.25 mg [place] once weekly x 4 weeks. Then inject 0.5 mg once weekly. 02/09/21   Lorrene Reid, PA-C  SUMAtriptan Succinate (ZEMBRACE SYMTOUCH) 3 MG/0.5ML SOAJ Inject 3 mg into the skin every 1 hour as needed for migraine. Max 4 injections in one day. 09/29/20   Melvenia Beam, MD  VITAMIN D PO Take by mouth.    [provider]    Family History Family History  Problem Relation Age of Onset  . Hypertension Father   . Diabetes Father   . Hyperlipidemia Father   . Alcohol abuse Maternal Grandmother   . Alcohol abuse Maternal Grandfather   . Heart attack Paternal Grandfather   . Cerebral aneurysm Mother   . Cerebral aneurysm Maternal Aunt   . Migraines Maternal Aunt   . Colon cancer Neg Hx   . Esophageal cancer Neg Hx   . Rectal cancer Neg Hx   . Stomach cancer Neg Hx     Social History Social History   Tobacco Use  . Smoking status: Never Smoker  . Smokeless tobacco: Never Used  . Tobacco comment: tried tobacco a few times as a teen, never picked up the habit  Vaping Use  . Vaping Use: Never used  Substance Use Topics  . Alcohol use: Yes    Alcohol/week: 0.0 standard drinks    Comment: Rare; 2-3 times per year socially  . Drug use: No     Allergies   Sulfa antibiotics   Review of Systems Review of Systems  Constitutional: Positive for chills, fatigue and fever. Negative for activity change and appetite change.  HENT: Positive for congestion, rhinorrhea and sinus pressure. Negative for ear pain, sore throat and trouble swallowing.   Eyes: Negative for  discharge and redness.  Respiratory: Negative for cough, chest tightness and shortness of breath.   Cardiovascular: Negative for chest pain.  Gastrointestinal: Negative for abdominal pain, diarrhea, nausea and vomiting.  Musculoskeletal: Negative for myalgias.  Skin: Negative for rash.  Neurological: Negative for dizziness, light-headedness and headaches.  Physical Exam Triage Vital Signs ED Triage Vitals [03/27/21 1529]  Enc Vitals Group     BP 126/89     Pulse Rate (!) 112     Resp 18     Temp 98.1 F (36.7 C)     Temp Source Oral     SpO2 98 %     Weight      Height      Head Circumference      Peak Flow      Pain Score 3     Pain Loc      Pain Edu?      Excl. in [place]?    No data found.  Updated Vital Signs BP 126/89 (BP Location: Left Arm)   Pulse (!) 112   Temp 98.1 F (36.7 C) (Oral)   Resp 18   SpO2 98%   Visual Acuity Right Eye Distance:   Left Eye Distance:   Bilateral Distance:    Right Eye Near:   Left Eye Near:    Bilateral Near:     Physical Exam Vitals and nursing note reviewed.  Constitutional:      Appearance: She is well-developed.     Comments: No acute distress  HENT:     Head: Normocephalic and atraumatic.     Ears:     Comments: Bilateral ears without tenderness to palpation of external auricle, tragus and mastoid, EAC's without erythema or swelling, TM's with good bony landmarks and cone of light. Non erythematous.     Nose: Nose normal.     Mouth/Throat:     Comments: Oral mucosa pink and moist, no tonsillar enlargement or exudate. Posterior pharynx patent and nonerythematous, no uvula deviation or swelling. Normal phonation. Eyes:     Conjunctiva/sclera: Conjunctivae normal.  Cardiovascular:     Rate and Rhythm: Normal rate and regular rhythm.  Pulmonary:     Effort: Pulmonary effort is normal. No respiratory distress.     Comments: Breathing comfortably at rest, CTABL, no wheezing, rales or other adventitious sounds  auscultated Abdominal:     General: There is no distension.  Musculoskeletal:        General: Normal range of motion.     Cervical back: Neck supple.  Skin:    General: Skin is warm and dry.  Neurological:     Mental Status: She is alert and oriented to person, place, and time.      UC Treatments / Results  Labs (all labs ordered are listed, but only abnormal results are displayed) Labs Reviewed  NOVEL CORONAVIRUS, NAA    EKG   Radiology No results found.  Procedures Procedures (including critical care time)  Medications Ordered in UC Medications - No data to display  Initial Impression / Assessment and Plan / UC Course  I have reviewed the triage vital signs and the nursing notes.  Pertinent labs & imaging results that were available during my care of the patient were reviewed by me and considered in my medical decision making (see chart for details).     Viral URI-COVID test pending, exam reassuring, recommending symptomatic and supportive care, suspect viral etiology.  Discussed strict return precautions. Patient verbalized understanding and is agreeable with plan.  Final Clinical Impressions(s) / UC Diagnoses   Final diagnoses:  Viral URI with cough     Discharge Instructions     COVID test pending, monitor MyChart for results Begin daily cetirizine and Flonase to help with congestion, sinus pressure and postnasal drainage Ibuprofen  and Tylenol for headaches, body aches, fevers Rest and fluids Follow-up if not improving or worsening    ED Prescriptions    Medication Sig Dispense Auth. Provider   fluticasone (FLONASE) 50 MCG/ACT nasal spray Place 1-2 sprays into both nostrils daily. 16 g Rudie Rikard C, PA-C   Cetirizine HCl 10 MG CAPS Take 1 capsule (10 mg total) by mouth daily for 10 days. 10 capsule Andreah Goheen C, PA-C   ibuprofen (ADVIL) 600 MG tablet Take 1 tablet (600 mg total) by mouth every 6 (six) hours as needed. 30 tablet Jazmon Kos,  [place] C, PA-C     PDMP not reviewed this encounter.   Janith Lima, [place] 03/28/21 [phone]

## 2021-03-28 ENCOUNTER — Encounter: Payer: Self-pay | Admitting: Physician Assistant

## 2021-03-28 ENCOUNTER — Ambulatory Visit: Payer: Federal, State, Local not specified - PPO | Admitting: Physician Assistant

## 2021-03-28 VITALS — Ht 65.0 in | Wt 179.0 lb

## 2021-03-28 DIAGNOSIS — E669 Obesity, unspecified: Secondary | ICD-10-CM | POA: Diagnosis not present

## 2021-03-28 LAB — NOVEL CORONAVIRUS, NAA: SARS-CoV-2, NAA: DETECTED — AB

## 2021-03-28 MED ORDER — OZEMPIC (0.25 OR 0.5 MG/DOSE) 2 MG/1.5ML ~~LOC~~ SOPN: 0.5000 mg | PEN_INJECTOR | SUBCUTANEOUS | 0 refills | Status: DC

## 2021-03-28 NOTE — Progress Notes (Signed)
Telehealth office visit note for Christine Reid, PA-C- at Primary Care at [facility]   I connected with current patient today by telephone and verified that I am speaking with the correct person   . Location of the patient: Home . Location of the provider: Office - This visit type was conducted due to national recommendations for restrictions regarding the COVID-19 Pandemic (e.g. social distancing) in an effort to limit this patient's exposure and mitigate transmission in our community.    - No physical exam could be performed with this format, beyond that communicated to [place] by the patient/ family members as noted.   - Additionally my office staff/ schedulers were to discuss with the patient that there may be a monetary charge related to this service, depending on their medical insurance.  My understanding is that patient understood and consented to proceed.     _________________________________________________________________________________   History of Present Illness: Patient calls in to follow up weight management. Patient tolerating 0.25 mg without significant issues. Reports has "vague" nausea, once she starts eating gets a sensation that she does not want that food/meal. Denies vomiting or abdominal pain. States has reduced snacks (crackers) and trying to eat more protein such as chicken. Is walking more and plans to start a low impact physical activity regimen. States per her ambulatory records, has lost about 10 pounds.     No flowsheet data found.  Depression screen [facility] 2/9 03/28/2021 02/09/2021 02/09/2021 09/13/2020 04/05/2020  Decreased Interest 0 0 0 0 0  Down, Depressed, Hopeless 0 0 0 0 0  PHQ - 2 Score 0 0 0 0 0  Altered sleeping 0 0 - 0 1  Tired, decreased energy 1 1 - 1 1  Change in appetite 1 3 - 1 0  Feeling bad or failure about yourself  0 1 - 1 0  Trouble concentrating 0 0 - 0 0  Moving slowly or fidgety/restless 0 0 - 0 0  Suicidal thoughts 0 0 - 0 0   PHQ-9 Score 2 5 - 3 2  Difficult doing work/chores Not difficult at all Somewhat difficult - Not difficult at all Not difficult at all  Some recent data might be hidden      Impression and Recommendations:     1. Obesity, Class I, BMI 30-34.9     Obesity, Class I, BMI 30-34.9: -Associated with hypertriglyceridemia.  -Per chart weight review, patient has lost approximately 4 pounds since last visit.  -Will titrate Ozempic to 0.5 mg once a week. -Encourage to continue with dietary changes and increase physical activity with ultimate goal of 150 mins/wk. -Follow up in 4 weeks   - As part of my medical decision making, I reviewed the following data within the [place] History obtained from pt /family, CMA notes reviewed and incorporated if applicable, Labs reviewed, Radiograph/ tests reviewed if applicable and OV notes from prior OV's with me, as well as any other specialists she/he has seen since seeing me last, were all reviewed and used in my medical decision making process today.    - Additionally, when appropriate, discussion had with patient regarding our treatment plan, and their biases/concerns about that plan were used in my medical decision making today.    - The patient agreed with the plan and demonstrated an understanding of the instructions.   No barriers to understanding were identified.     - The patient was advised to call back or seek an  in-person evaluation if the symptoms worsen or if the condition fails to improve as anticipated.   Return in about 4 weeks (around 04/25/2021) for Weight management. .    No orders of the defined types were placed in this encounter.   Meds ordered this encounter  Medications  . Semaglutide,0.25 or 0.5MG /DOS, (OZEMPIC, 0.25 OR 0.5 MG/DOSE,) 2 MG/1.5ML SOPN    Sig: Inject 0.5 mg into the skin once a week.    Dispense:  3 mL    Refill:  0    Order Specific Question:   Supervising Provider    Answer:   Beatrice Lecher D [[id number]]    Medications Discontinued During This Encounter  Medication Reason  . Semaglutide,0.25 or 0.5MG /DOS, (OZEMPIC, 0.25 OR 0.5 MG/DOSE,) 2 MG/1.5ML SOPN Dose change       Time spent on telephone encounter was 10 minutes.     The [place] was signed into law in 2016 which includes the topic of electronic health records.  This provides immediate access to information in MyChart.  This includes consultation notes, operative notes, office notes, lab results and pathology reports.  If you have any questions about what you read please let us know at your next visit or call us at the office.  We are right here with you.   __________________________________________________________________________________     Patient Care Team    Relationship Specialty Notifications Start End  Christine Frank, [place] PCP - General Physician Assistant  04/05/20   Marlou Sa, Tonna [place], MD Consulting Physician Orthopedic Surgery  12/07/16   Renie Ora, MD Referring Physician Anesthesiology  12/07/16   Anastasio Auerbach, MD (Inactive) Consulting Physician Gynecology  12/07/16      -Vitals obtained; medications/ allergies reconciled;  personal medical, social, Sx etc.histories were updated by CMA, reviewed by me and are reflected in chart   Patient Active Problem List   Diagnosis Date Noted  . Pain in right knee 10/06/2019  . Blood glucose elevated 09/10/2019  . Chronic migraine without aura without status migrainosus, not intractable 03/26/2019  . Dysuria 07/18/2018  . Depression 05/16/2018  . Grief 05/16/2018  . Vitamin D deficiency 11/08/2017  . Screening for cervical cancer 11/08/2017  . Healthcare maintenance 07/05/2017  . Menstrual migraine with status migrainosus, not intractable 07/05/2017  . Back pain of lumbar region with sciatica 07/05/2017  . Varicose veins of lower extremities with other complications 36/46/8032     Current Meds  Medication Sig  .  baclofen (LIORESAL) 10 MG tablet Take 1 tablet (10 mg total) by mouth 2 (two) times daily. Take 1/2 to 1 tablet 2 x day if needed for muscle spasm  . BIOTIN PO 10,000 mcg oral once daily  . butalbital-aspirin-caffeine (FIORINAL) 50-325-40 MG capsule TAKE 1 CAPSULE BY MOUTH EVERY 6 HOURS AS NEEDED FOR HEADACHE. 30 MUST LAST 90 DAYS  . Cetirizine HCl 10 MG CAPS Take 1 capsule (10 mg total) by mouth daily for 10 days.  Marland Kitchen CRANBERRY PO Take by mouth daily. With vitamin C  . diclofenac sodium (VOLTAREN) 1 % GEL Apply 1 application topically 3 (three) times daily as needed.  . docusate sodium (COLACE) 100 MG capsule Take 100 mg by mouth 2 (two) times daily.  . DULoxetine (CYMBALTA) 60 MG capsule Take 1 capsule (60 mg total) by mouth daily.  . fluticasone (FLONASE) 50 MCG/ACT nasal spray Place 1-2 sprays into both nostrils daily.  Marland Kitchen gabapentin (NEURONTIN) 300 MG capsule TAKE 1 CAPSULE BY MOUTH THREE TIMES  A DAY  . HYDROcodone-acetaminophen (NORCO) 10-325 MG tablet Take 1 tablet by mouth 4 (four) times daily.   Marland Kitchen ibuprofen (ADVIL) 600 MG tablet Take 1 tablet (600 mg total) by mouth every 6 (six) hours as needed.  Marland Kitchen MELATONIN PO Take by mouth.  . morphine (MS CONTIN) 30 MG 12 hr tablet Take 1 tablet by mouth 2 (two) times daily.  . Multiple Vitamin (MULTIVITAMIN) capsule Take 1 capsule by mouth daily.  . pantoprazole (PROTONIX) 20 MG tablet Take 1 tablet (20 mg total) by mouth daily.  . promethazine (PHENERGAN) 25 MG tablet Take 1 tablet (25 mg total) by mouth every 6 (six) hours as needed for nausea or vomiting.  . rizatriptan (MAXALT) 10 MG tablet TAKE 1 TABLET AT ONSET OF HEADACHE, MAY REPEAT ONCE IN 2 HOURS IF NEEDED  . Semaglutide,0.25 or 0.5MG /DOS, (OZEMPIC, 0.25 OR 0.5 MG/DOSE,) 2 MG/1.5ML SOPN Inject 0.5 mg into the skin once a week.  . SUMAtriptan Succinate (ZEMBRACE SYMTOUCH) 3 MG/0.5ML SOAJ Inject 3 mg into the skin every 1 hour as needed for migraine. Max 4 injections in one day.  Marland Kitchen VITAMIN D  PO Take by mouth.  . [DISCONTINUED] Semaglutide,0.25 or 0.5MG /DOS, (OZEMPIC, 0.25 OR 0.5 MG/DOSE,) 2 MG/1.5ML SOPN Inject 0.25 mg [place] once weekly x 4 weeks. Then inject 0.5 mg once weekly.   Current Facility-Administered Medications for the 03/28/21 encounter (Office Visit) with Christine Reid, PA-C  Medication  . 0.9 %  sodium chloride infusion     Allergies:  Allergies  Allergen Reactions  . Sulfa Antibiotics Swelling and Rash     ROS:  See above HPI for pertinent positives and negatives   Objective:   Height 5\' 5"  (1.651 m), weight 179 lb (81.2 kg).  (if some vitals are omitted, this means that patient was UNABLE to obtain them.) General: A & O * 3; sounds in no acute distress; in usual state of health.  Respiratory: speaking in full sentences, no conversational dyspnea Psych: insight appears good, mood- appears full

## 2021-03-28 NOTE — Patient Instructions (Signed)

## 2021-03-29 ENCOUNTER — Telehealth: Payer: Self-pay | Admitting: Adult Health

## 2021-03-29 NOTE — Telephone Encounter (Signed)
Called to discuss with patient about COVID-19 symptoms and the use of one of the available treatments for those with mild to moderate Covid symptoms and at a high risk of hospitalization.  Pt appears to qualify for outpatient treatment due to co-morbid conditions and/or a member of an at-risk group in accordance with the FDA Emergency Use Authorization.      Unable to reach pt - LMOM   Christine Frank   

## 2021-04-01 ENCOUNTER — Other Ambulatory Visit: Payer: Self-pay | Admitting: Physician Assistant

## 2021-04-01 DIAGNOSIS — K219 Gastro-esophageal reflux disease without esophagitis: Secondary | ICD-10-CM

## 2021-04-04 ENCOUNTER — Other Ambulatory Visit: Payer: Self-pay | Admitting: Physician Assistant

## 2021-04-04 ENCOUNTER — Telehealth: Payer: Self-pay | Admitting: Physician Assistant

## 2021-04-04 DIAGNOSIS — E669 Obesity, unspecified: Secondary | ICD-10-CM

## 2021-04-04 DIAGNOSIS — K219 Gastro-esophageal reflux disease without esophagitis: Secondary | ICD-10-CM

## 2021-04-04 MED ORDER — OZEMPIC (0.25 OR 0.5 MG/DOSE) 2 MG/1.5ML ~~LOC~~ SOPN: 0.5000 mg | PEN_INJECTOR | SUBCUTANEOUS | 0 refills | Status: DC

## 2021-04-04 NOTE — Telephone Encounter (Signed)
Med sent to requested location. AS< CMA

## 2021-04-04 NOTE — Addendum Note (Signed)
Addended by: Mickel Crow on: 04/04/2021 02:26 PM   Modules accepted: Orders

## 2021-04-04 NOTE — Telephone Encounter (Signed)
Patient left a voicemail stating her prescription for Ozempic that was called into [facility] is not in stock and they are not sure when it will be. Patient states it is in stock at [facility] and would like it sent there if possible. Please advise, thanks.

## 2021-04-20 ENCOUNTER — Telehealth: Payer: Self-pay | Admitting: Physician Assistant

## 2021-04-20 NOTE — Telephone Encounter (Signed)
Left msg for patient to call office. Pt past due for mammogram. AS, CMA

## 2021-04-24 ENCOUNTER — Other Ambulatory Visit: Payer: Self-pay | Admitting: Neurology

## 2021-04-26 DIAGNOSIS — Z79891 Long term (current) use of opiate analgesic: Secondary | ICD-10-CM | POA: Diagnosis not present

## 2021-04-26 DIAGNOSIS — M7061 Trochanteric bursitis, right hip: Secondary | ICD-10-CM | POA: Diagnosis not present

## 2021-04-26 DIAGNOSIS — M1711 Unilateral primary osteoarthritis, right knee: Secondary | ICD-10-CM | POA: Diagnosis not present

## 2021-04-26 DIAGNOSIS — M1612 Unilateral primary osteoarthritis, left hip: Secondary | ICD-10-CM | POA: Diagnosis not present

## 2021-04-26 DIAGNOSIS — Z79899 Other long term (current) drug therapy: Secondary | ICD-10-CM | POA: Diagnosis not present

## 2021-04-26 DIAGNOSIS — G894 Chronic pain syndrome: Secondary | ICD-10-CM | POA: Diagnosis not present

## 2021-05-04 ENCOUNTER — Ambulatory Visit: Payer: Federal, State, Local not specified - PPO | Admitting: Dermatology

## 2021-05-04 ENCOUNTER — Other Ambulatory Visit: Payer: Self-pay | Admitting: Physician Assistant

## 2021-05-04 NOTE — Telephone Encounter (Signed)
Left voicemail for patient

## 2021-05-04 NOTE — Telephone Encounter (Signed)
Please reach out to patient to schedule a follow up appointment per her last AVS.

## 2021-05-10 ENCOUNTER — Telehealth: Payer: Self-pay | Admitting: Physician Assistant

## 2021-05-10 ENCOUNTER — Ambulatory Visit (INDEPENDENT_AMBULATORY_CARE_PROVIDER_SITE_OTHER): Payer: Federal, State, Local not specified - PPO | Admitting: Physician Assistant

## 2021-05-10 ENCOUNTER — Encounter: Payer: Self-pay | Admitting: Physician Assistant

## 2021-05-10 VITALS — Wt 176.0 lb

## 2021-05-10 DIAGNOSIS — E669 Obesity, unspecified: Secondary | ICD-10-CM | POA: Diagnosis not present

## 2021-05-10 MED ORDER — OZEMPIC (0.25 OR 0.5 MG/DOSE) 2 MG/1.5ML ~~LOC~~ SOPN: 1.0000 mg | PEN_INJECTOR | SUBCUTANEOUS | 0 refills | Status: DC

## 2021-05-10 MED ORDER — OZEMPIC (0.25 OR 0.5 MG/DOSE) 2 MG/1.5ML ~~LOC~~ SOPN: 1.0000 mg | PEN_INJECTOR | SUBCUTANEOUS | 1 refills | Status: DC

## 2021-05-10 NOTE — Telephone Encounter (Signed)
Spoke with pharmacy staff and the prescription has been changed to the 1mg  to last the patient one month.

## 2021-05-10 NOTE — Telephone Encounter (Signed)
Pharmacy would like to know about the Ozempic prescription. Is it only for two weeks? Or the milligram instead? If it is filled for how it is wrote it will only last two weeks. Thanks

## 2021-05-10 NOTE — Progress Notes (Signed)
Telehealth office visit note for Christine Reid, PA-C- at Primary Care at [facility]   I connected with current patient today by telephone and verified that I am speaking with the correct person    Location of the patient: Home  Location of the provider: Office - This visit type was conducted due to national recommendations for restrictions regarding the COVID-19 Pandemic (e.g. social distancing) in an effort to limit this patient's exposure and mitigate transmission in our community.    - No physical exam could be performed with this format, beyond that communicated to [place] by the patient/ family members as noted.   - Additionally my office staff/ schedulers were to discuss with the patient that there may be a monetary charge related to this service, depending on their medical insurance.  My understanding is that patient understood and consented to proceed.     _________________________________________________________________________________   History of Present Illness: Patient calls in to follow up on weight management. Patient reports was without her medication for about two weeks due to the pharmacy not filling her prescription on time. Denies nausea, vomiting, or abdominal pain. Continues to work on dietary changes. Reports has worked on eating better by trying to have a balanced meal with protein, carbohydrates and vegetables. Feels more active and better. Due to her work schedule has not been able to have a regular exercise routine but hopefully that will change soon.      No flowsheet data found.  Depression screen [facility] 2/9 05/10/2021 03/28/2021 02/09/2021 02/09/2021 09/13/2020  Decreased Interest 0 0 0 0 0  Down, Depressed, Hopeless 0 0 0 0 0  PHQ - 2 Score 0 0 0 0 0  Altered sleeping 0 0 0 - 0  Tired, decreased energy 0 1 1 - 1  Change in appetite 0 1 3 - 1  Feeling bad or failure about yourself  0 0 1 - 1  Trouble concentrating 0 0 0 - 0  Moving slowly or fidgety/restless  0 0 0 - 0  Suicidal thoughts 0 0 0 - 0  PHQ-9 Score 0 2 5 - 3  Difficult doing work/chores - Not difficult at all Somewhat difficult - Not difficult at all  Some recent data might be hidden      Impression and Recommendations:     1. Obesity, Class I, BMI 30-34.9     Obesity, Class I, BMI 30-34.9: -Associated with hypertriglyceridemia. -Patient has lost approximately 3 pounds since last telehealth visit.  -Tolerating Ozempic 0.5 mg without issues so will titrate to 1 mg. Advised patient to let me know if unable to tolerate increased dose. -Recommend to continue with dietary changes and encourage to establish a physical activity regimen.    - As part of my medical decision making, I reviewed the following data within the [place] History obtained from pt /family, CMA notes reviewed and incorporated if applicable, Labs reviewed, Radiograph/ tests reviewed if applicable and OV notes from prior OV's with me, as well as any other specialists she/he has seen since seeing me last, were all reviewed and used in my medical decision making process today.    - Additionally, when appropriate, discussion had with patient regarding our treatment plan, and their biases/concerns about that plan were used in my medical decision making today.    - The patient agreed with the plan and demonstrated an understanding of the instructions.   No barriers to understanding were identified.     -  The patient was advised to call back or seek an in-person evaluation if the symptoms worsen or if the condition fails to improve as anticipated.   Return in about 4 weeks (around 06/07/2021) for Wt.    No orders of the defined types were placed in this encounter.   Meds ordered this encounter  Medications   Semaglutide,0.25 or 0.5MG /DOS, (OZEMPIC, 0.25 OR 0.5 MG/DOSE,) 2 MG/1.5ML SOPN    Sig: Inject 1 mg into the skin once a week.    Dispense:  3 mL    Refill:  0    Order Specific Question:    Supervising Provider    Answer:   Beatrice Lecher D [[id number]]    Medications Discontinued During This Encounter  Medication Reason   Semaglutide,0.25 or 0.5MG /DOS, (OZEMPIC, 0.25 OR 0.5 MG/DOSE,) 2 MG/1.5ML SOPN        Time spent on visit including pre-visit chart review and post-visit care was 8 minutes.      The [place] was signed into law in 2016 which includes the topic of electronic health records.  This provides immediate access to information in MyChart.  This includes consultation notes, operative notes, office notes, lab results and pathology reports.  If you have any questions about what you read please let us know at your next visit or call us at the office.  We are right here with you.   __________________________________________________________________________________     Patient Care Team    Relationship Specialty Notifications Start End  Christine Frank, [place] PCP - General Physician Assistant  04/05/20   Marlou Sa, Tonna Corner, MD Consulting Physician Orthopedic Surgery  12/07/16   Renie Ora, MD Referring Physician Anesthesiology  12/07/16   Anastasio Auerbach, MD (Inactive) Consulting Physician Gynecology  12/07/16      -Vitals obtained; medications/ allergies reconciled;  personal medical, social, Sx etc.histories were updated by CMA, reviewed by me and are reflected in chart   Patient Active Problem List   Diagnosis Date Noted   Pain in right knee 10/06/2019   Blood glucose elevated 09/10/2019   Chronic migraine without aura without status migrainosus, not intractable 03/26/2019   Dysuria 07/18/2018   Depression 05/16/2018   Grief 05/16/2018   Vitamin D deficiency 11/08/2017   Screening for cervical cancer 11/08/2017   Healthcare maintenance 07/05/2017   Menstrual migraine with status migrainosus, not intractable 07/05/2017   Back pain of lumbar region with sciatica 07/05/2017   Varicose veins of lower extremities with other  complications 62/95/2841     Current Meds  Medication Sig   baclofen (LIORESAL) 10 MG tablet Take 1 tablet (10 mg total) by mouth 2 (two) times daily. Take 1/2 to 1 tablet 2 x day if needed for muscle spasm   BIOTIN PO 10,000 mcg oral once daily   CRANBERRY PO Take by mouth daily. With vitamin C   diclofenac sodium (VOLTAREN) 1 % GEL Apply 1 application topically 3 (three) times daily as needed.   docusate sodium (COLACE) 100 MG capsule Take 100 mg by mouth 2 (two) times daily.   DULoxetine (CYMBALTA) 60 MG capsule Take 1 capsule (60 mg total) by mouth daily.   fluticasone (FLONASE) 50 MCG/ACT nasal spray INSTILL 1 SPRAY INTO BOTH NOSTRILS DAILY   gabapentin (NEURONTIN) 300 MG capsule TAKE 1 CAPSULE BY MOUTH THREE TIMES A DAY   HYDROcodone-acetaminophen (NORCO) 10-325 MG tablet Take 1 tablet by mouth 4 (four) times daily.    ibuprofen (ADVIL) 600 MG tablet Take 1 tablet (  600 mg total) by mouth every 6 (six) hours as needed.   MELATONIN PO Take by mouth.   morphine (MS CONTIN) 30 MG 12 hr tablet Take 1 tablet by mouth 2 (two) times daily.   Multiple Vitamin (MULTIVITAMIN) capsule Take 1 capsule by mouth daily.   pantoprazole (PROTONIX) 20 MG tablet TAKE 1 TABLET BY MOUTH EVERY DAY   promethazine (PHENERGAN) 25 MG tablet Take 1 tablet (25 mg total) by mouth every 6 (six) hours as needed for nausea or vomiting.   rizatriptan (MAXALT) 10 MG tablet TAKE 1 TABLET AT ONSET OF HEADACHE, MAY REPEAT ONCE IN 2 HOURS IF NEEDED   SUMAtriptan Succinate (ZEMBRACE SYMTOUCH) 3 MG/0.5ML SOAJ Inject 3 mg into the skin every 1 hour as needed for migraine. Max 4 injections in one day.   VITAMIN D PO Take by mouth.   [DISCONTINUED] Semaglutide,0.25 or 0.5MG /DOS, (OZEMPIC, 0.25 OR 0.5 MG/DOSE,) 2 MG/1.5ML SOPN Inject 0.5 mg into the skin once a week.   Current Facility-Administered Medications for the 05/10/21 encounter (Office Visit) with Christine Reid, PA-C  Medication   0.9 %  sodium chloride infusion      Allergies:  Allergies  Allergen Reactions   Sulfa Antibiotics Swelling and Rash     ROS:  See above HPI for pertinent positives and negatives   Objective:   Weight 176 lb (79.8 kg).   (if some vitals are omitted, this means that patient was UNABLE to obtain them. ) General: A & O * 3; sounds in no acute distress.  Respiratory: speaking in full sentences, no conversational dyspnea Psych: insight appears good, mood- appears full

## 2021-05-10 NOTE — Addendum Note (Signed)
Addended by: Lorrene Reid on: 05/10/2021 04:47 PM   Modules accepted: Orders

## 2021-05-13 ENCOUNTER — Other Ambulatory Visit: Payer: Self-pay

## 2021-05-13 ENCOUNTER — Ambulatory Visit
Admission: RE | Admit: 2021-05-13 | Discharge: 2021-05-13 | Disposition: A | Discharge status: Home / Self Care | Payer: Federal, State, Local not specified - PPO | Source: Ambulatory Visit | Attending: Physician Assistant | Admitting: Physician Assistant

## 2021-05-13 DIAGNOSIS — Z1231 Encounter for screening mammogram for malignant neoplasm of breast: Secondary | ICD-10-CM | POA: Diagnosis not present

## 2021-05-26 DIAGNOSIS — G894 Chronic pain syndrome: Secondary | ICD-10-CM | POA: Diagnosis not present

## 2021-05-26 DIAGNOSIS — M1711 Unilateral primary osteoarthritis, right knee: Secondary | ICD-10-CM | POA: Diagnosis not present

## 2021-05-26 DIAGNOSIS — M1612 Unilateral primary osteoarthritis, left hip: Secondary | ICD-10-CM | POA: Diagnosis not present

## 2021-05-26 DIAGNOSIS — M7061 Trochanteric bursitis, right hip: Secondary | ICD-10-CM | POA: Diagnosis not present

## 2021-06-14 DIAGNOSIS — M1612 Unilateral primary osteoarthritis, left hip: Secondary | ICD-10-CM | POA: Diagnosis not present

## 2021-06-23 ENCOUNTER — Telehealth: Payer: Self-pay | Admitting: Physician Assistant

## 2021-06-23 ENCOUNTER — Other Ambulatory Visit: Payer: Self-pay | Admitting: Physician Assistant

## 2021-06-23 DIAGNOSIS — M24152 Other articular cartilage disorders, left hip: Secondary | ICD-10-CM | POA: Diagnosis not present

## 2021-06-23 DIAGNOSIS — G894 Chronic pain syndrome: Secondary | ICD-10-CM | POA: Diagnosis not present

## 2021-06-23 DIAGNOSIS — E669 Obesity, unspecified: Secondary | ICD-10-CM

## 2021-06-23 DIAGNOSIS — M7061 Trochanteric bursitis, right hip: Secondary | ICD-10-CM | POA: Diagnosis not present

## 2021-06-23 DIAGNOSIS — M1612 Unilateral primary osteoarthritis, left hip: Secondary | ICD-10-CM | POA: Diagnosis not present

## 2021-06-23 NOTE — Telephone Encounter (Signed)
Error

## 2021-06-24 ENCOUNTER — Other Ambulatory Visit: Payer: Self-pay | Admitting: Physician Assistant

## 2021-06-24 ENCOUNTER — Other Ambulatory Visit: Payer: Self-pay | Admitting: Neurology

## 2021-06-24 DIAGNOSIS — F32A Depression, unspecified: Secondary | ICD-10-CM

## 2021-07-06 ENCOUNTER — Other Ambulatory Visit: Payer: Self-pay | Admitting: Family Medicine

## 2021-07-06 ENCOUNTER — Ambulatory Visit: Payer: Self-pay

## 2021-07-06 ENCOUNTER — Other Ambulatory Visit: Payer: Self-pay

## 2021-07-06 DIAGNOSIS — M25552 Pain in left hip: Secondary | ICD-10-CM

## 2021-07-07 ENCOUNTER — Ambulatory Visit (INDEPENDENT_AMBULATORY_CARE_PROVIDER_SITE_OTHER): Payer: Federal, State, Local not specified - PPO | Admitting: Physician Assistant

## 2021-07-07 ENCOUNTER — Encounter: Payer: Self-pay | Admitting: Physician Assistant

## 2021-07-07 VITALS — Ht 65.0 in | Wt 169.0 lb

## 2021-07-07 DIAGNOSIS — E663 Overweight: Secondary | ICD-10-CM | POA: Diagnosis not present

## 2021-07-07 DIAGNOSIS — Z6828 Body mass index (BMI) 28.0-28.9, adult: Secondary | ICD-10-CM | POA: Diagnosis not present

## 2021-07-07 NOTE — Patient Instructions (Signed)

## 2021-07-07 NOTE — Progress Notes (Signed)
Telehealth office visit note for Christine Reid, PA-C- at Primary Care at [facility]   I connected with current patient today by telephone and verified that I am speaking with the correct person    Location of the patient: Home  Location of the provider: Office - This visit type was conducted due to national recommendations for restrictions regarding the COVID-19 Pandemic (e.g. social distancing) in an effort to limit this patient's exposure and mitigate transmission in our community.    - No physical exam could be performed with this format, beyond that communicated to [place] by the patient/ family members as noted.   - Additionally my office staff/ schedulers were to discuss with the patient that there may be a monetary charge related to this service, depending on their medical insurance.  My understanding is that patient understood and consented to proceed.     _________________________________________________________________________________   History of Present Illness: Patient calls in to follow up on weight management. Patient reports tolerating Ozempic 1 mg without issues. States has been more active, is walking around her neighborhood for 30-45 minutes few times per week. Is trying to increase her vegetable intake. States has noticed she does not need as much food so in essence is eating less. Feels better and is doing more. States would like to get down to 140 pounds.     No flowsheet data found.  Depression screen [facility] 2/9 07/07/2021 05/10/2021 03/28/2021 02/09/2021 02/09/2021  Decreased Interest 0 0 0 0 0  Down, Depressed, Hopeless 0 0 0 0 0  PHQ - 2 Score 0 0 0 0 0  Altered sleeping 0 0 0 0 -  Tired, decreased energy 0 0 1 1 -  Change in appetite 0 0 1 3 -  Feeling bad or failure about yourself  0 0 0 1 -  Trouble concentrating 0 0 0 0 -  Moving slowly or fidgety/restless 0 0 0 0 -  Suicidal thoughts 0 0 0 0 -  PHQ-9 Score 0 0 2 5 -  Difficult doing work/chores - - Not  difficult at all Somewhat difficult -  Some recent data might be hidden      Impression and Recommendations:     1. Overweight with body mass index (BMI) of 28 to 28.9 in adult   -Associated with hyperlipidemia and Vitamin d deficiency. -Patient has lost 7 pounds since last visit and a total of 14 pounds since starting medication (Ozempic). Will continue current med regimen. -Encourage to continue with dietary changes and walking regimen. -Follow up in 4 weeks    - As part of my medical decision making, I reviewed the following data within the [place] History obtained from pt /family, CMA notes reviewed and incorporated if applicable, Labs reviewed, Radiograph/ tests reviewed if applicable and OV notes from prior OV's with me, as well as any other specialists she/he has seen since seeing me last, were all reviewed and used in my medical decision making process today.    - Additionally, when appropriate, discussion had with patient regarding our treatment plan, and their biases/concerns about that plan were used in my medical decision making today.    - The patient agreed with the plan and demonstrated an understanding of the instructions.   No barriers to understanding were identified.     - The patient was advised to call back or seek an in-person evaluation if the symptoms worsen or if the condition fails to  improve as anticipated.   Return in about 4 weeks (around 08/04/2021) for weight, mood.    No orders of the defined types were placed in this encounter.   No orders of the defined types were placed in this encounter.   There are no discontinued medications.     Time spent on telephone encounter was 6 minutes.      The [place] was signed into law in 2016 which includes the topic of electronic health records.  This provides immediate access to information in MyChart.  This includes consultation notes, operative notes, office notes, lab  results and pathology reports.  If you have any questions about what you read please let us know at your next visit or call us at the office.  We are right here with you.   __________________________________________________________________________________     Patient Care Team    Relationship Specialty Notifications Start End  Christine Frank, [place] PCP - General Physician Assistant  04/05/20   Marlou Sa, Tonna Corner, MD Consulting Physician Orthopedic Surgery  12/07/16   Renie Ora, MD Referring Physician Anesthesiology  12/07/16   Anastasio Auerbach, MD (Inactive) Consulting Physician Gynecology  12/07/16      -Vitals obtained; medications/ allergies reconciled;  personal medical, social, Sx etc.histories were updated by CMA, reviewed by me and are reflected in chart   Patient Active Problem List   Diagnosis Date Noted   Pain in right knee 10/06/2019   Blood glucose elevated 09/10/2019   Chronic migraine without aura without status migrainosus, not intractable 03/26/2019   Dysuria 07/18/2018   Depression 05/16/2018   Grief 05/16/2018   Vitamin D deficiency 11/08/2017   Screening for cervical cancer 11/08/2017   Healthcare maintenance 07/05/2017   Menstrual migraine with status migrainosus, not intractable 07/05/2017   Back pain of lumbar region with sciatica 07/05/2017   Varicose veins of lower extremities with other complications [id number]     Current Meds  Medication Sig   baclofen (LIORESAL) 10 MG tablet Take 1 tablet (10 mg total) by mouth 2 (two) times daily. Take 1/2 to 1 tablet 2 x day if needed for muscle spasm   BIOTIN PO 10,000 mcg oral once daily   butalbital-aspirin-caffeine (FIORINAL) 50-325-40 MG capsule TAKE 1 CAPSULE BY MOUTH EVERY 6 HOURS AS NEEDED FOR HEADACHE. 30 MUST LAST 90 DAYS   CRANBERRY PO Take by mouth daily. With vitamin C   diclofenac sodium (VOLTAREN) 1 % GEL Apply 1 application topically 3 (three) times daily as needed.   docusate sodium  (COLACE) 100 MG capsule Take 100 mg by mouth 2 (two) times daily.   DULoxetine (CYMBALTA) 60 MG capsule TAKE 1 CAPSULE BY MOUTH EVERY DAY   fluticasone (FLONASE) 50 MCG/ACT nasal spray INSTILL 1 SPRAY INTO BOTH NOSTRILS DAILY   gabapentin (NEURONTIN) 300 MG capsule TAKE 1 CAPSULE BY MOUTH THREE TIMES A DAY   HYDROcodone-acetaminophen (NORCO) 10-325 MG tablet Take 1 tablet by mouth 4 (four) times daily.    ibuprofen (ADVIL) 600 MG tablet Take 1 tablet (600 mg total) by mouth every 6 (six) hours as needed.   MELATONIN PO Take by mouth.   morphine (MS CONTIN) 30 MG 12 hr tablet Take 1 tablet by mouth 2 (two) times daily.   Multiple Vitamin (MULTIVITAMIN) capsule Take 1 capsule by mouth daily.   OZEMPIC, 1 MG/DOSE, 4 MG/3ML SOPN INJECT 1 MG SUBCUTANEOUSLY ONCE WEEKLY   pantoprazole (PROTONIX) 20 MG tablet TAKE 1 TABLET BY MOUTH EVERY DAY  promethazine (PHENERGAN) 25 MG tablet Take 1 tablet (25 mg total) by mouth every 6 (six) hours as needed for nausea or vomiting.   rizatriptan (MAXALT) 10 MG tablet TAKE 1 TABLET AT ONSET OF HEADACHE, MAY REPEAT ONCE IN 2 HOURS IF NEEDED   Semaglutide,0.25 or 0.'5MG'$ /DOS, (OZEMPIC, 0.25 OR 0.5 MG/DOSE,) 2 MG/1.5ML SOPN Inject 1 mg into the skin once a week.   SUMAtriptan Succinate (ZEMBRACE SYMTOUCH) 3 MG/0.5ML SOAJ Inject 3 mg into the skin every 1 hour as needed for migraine. Max 4 injections in one day.   VITAMIN D PO Take by mouth.   Current Facility-Administered Medications for the 07/07/21 encounter (Office Visit) with Christine Reid, PA-C  Medication   0.9 %  sodium chloride infusion     Allergies:  Allergies  Allergen Reactions   Sulfa Antibiotics Swelling and Rash     ROS:  See above HPI for pertinent positives and negatives   Objective:   Height '5\' 5"'$  (1.651 m), weight 169 lb (76.7 kg).  (if some vitals are omitted, this means that patient was UNABLE to obtain them. ) General: A & O * 3; sounds in no acute distress Respiratory: speaking in  full sentences, no conversational dyspnea Psych: insight appears good, mood- appears full

## 2021-07-25 DIAGNOSIS — M24152 Other articular cartilage disorders, left hip: Secondary | ICD-10-CM | POA: Diagnosis not present

## 2021-07-25 DIAGNOSIS — G894 Chronic pain syndrome: Secondary | ICD-10-CM | POA: Diagnosis not present

## 2021-07-25 DIAGNOSIS — M7061 Trochanteric bursitis, right hip: Secondary | ICD-10-CM | POA: Diagnosis not present

## 2021-07-25 DIAGNOSIS — M5136 Other intervertebral disc degeneration, lumbar region: Secondary | ICD-10-CM | POA: Diagnosis not present

## 2021-08-22 ENCOUNTER — Other Ambulatory Visit: Payer: Self-pay | Admitting: Physician Assistant

## 2021-08-24 DIAGNOSIS — M7061 Trochanteric bursitis, right hip: Secondary | ICD-10-CM | POA: Diagnosis not present

## 2021-08-24 DIAGNOSIS — G894 Chronic pain syndrome: Secondary | ICD-10-CM | POA: Diagnosis not present

## 2021-08-24 DIAGNOSIS — M1612 Unilateral primary osteoarthritis, left hip: Secondary | ICD-10-CM | POA: Diagnosis not present

## 2021-08-24 DIAGNOSIS — M24152 Other articular cartilage disorders, left hip: Secondary | ICD-10-CM | POA: Diagnosis not present

## 2021-09-14 ENCOUNTER — Telehealth (INDEPENDENT_AMBULATORY_CARE_PROVIDER_SITE_OTHER): Payer: Federal, State, Local not specified - PPO | Admitting: Neurology

## 2021-09-14 DIAGNOSIS — R51 Headache with orthostatic component, not elsewhere classified: Secondary | ICD-10-CM | POA: Diagnosis not present

## 2021-09-14 DIAGNOSIS — G43709 Chronic migraine without aura, not intractable, without status migrainosus: Secondary | ICD-10-CM

## 2021-09-14 NOTE — Progress Notes (Signed)
[facility]    Provider:  Dr Jaynee Eagles Referring Provider: Lorrene Reid, PA-C Primary Care Physician:  Lorrene Reid, PA-C  CC:  Migraines  Virtual Visit via Video Note  I connected with Erich Montane on 09/16/21 at  3:00 PM EDT by a video enabled telemedicine application and verified that I am speaking with the correct person using two identifiers.  Location: Patient: home Provider: office   I discussed the limitations of evaluation and management by telemedicine and the availability of in person appointments. The patient expressed understanding and agreed to proceed.   Follow Up Instructions:    I discussed the assessment and treatment plan with the patient. The patient was provided an opportunity to ask questions and all were answered. The patient agreed with the plan and demonstrated an understanding of the instructions.   The patient was advised to call back or seek an in-person evaluation if the symptoms worsen or if the condition fails to improve as anticipated.  I provided 20 minutes of non-face-to-face time during this encounter.   Melvenia Beam, MD   Interval history September 14, 2021: Patient is here for follow-up of migraines, saw her about a year ago when she was doing well on gabapentin, Maxalt was working well acutely for her, we did discuss other medications to try if needed.  I review of her chart does not show any recent migraine activity or ED visits for migraines headaches.  We did order Zembrace for headaches that she wakes up in the morning, injectable sumatriptan. She takes rizatriptan occ. She has used the zembrace only 2x in last year. The gabapentin helps. She works from home. She is happy with her management, we discussed other options, continue as is.   Interval history 09/09/2020: She is doing well on Gabapentin. She had a migraine 3 weeks ago and prior about a year. maxalt works well. Unfortunately sometimes she will  wake with a headache and the maxalt takes time to work, we discussed strategies and nasal sprays and injections and I will give her zomig nasal sample and zembrace for when she wakes with a migraine or have acute severity.  Interval history 03/26/2019: Follow up for migraines  She had a hard year, her husband passed away. Her headaches are the same, the more he gets into menopause the more variable. The triptan works. She has not had nausea. The rizatriptan works, sometimes it will come back later in the day so advised to take with alleve. She rarely takes the Fiorinal. It is less evident if she is having a migraine, it may start out at as a headache now and may or may not progress to a migraine. She may try tylenol and if it doesn't work she takes a fiorinal. She may take 8 Rizatriptans sometimes more. She takes 1/3 of the prescription of the bottle. Birth control may help stabilize  I suggested we start a preventative. She would like to keep a headache journal. She is waking in the middle of the night with headaches and in the morning. The headaches are positional and exertional. Monring headaches. Worsening vision loss. Her husband dies so she did not get imaging, worsening pulsatule tinnitus. Needs to get imaging completed due to progression of symptoms.  HPI:  Christine Frank is a 55 y.o. female here as a referral from Dr. Mariel Kansky for migraines. Started at the age of 12-13 around the time she started her period. Migraines improved with birth control and having kids. Migraines became  severe again in her 38s. Had significant nausea and vomiting. Migraines worse around her menses. She would get one at the start of menses, at the end of menstrual cycle and maybe one somewhere in between. She started having migraines with hot flashes with menopause. Her mother had 2 aneurysms and so did her aunt. She has 25 headache free days. Maybe 1-2 migraine days a month. Her migraines are unilateral, like her head is  in a vice, light and sound sensitivity, nausea and vomiting. She wakes with them, worse bending over, positional. She has vision loss in both eyes peripheral vision loss. Hot flash triggers. Aunt has migraines. She has neck pain as well with the headaches. She has pulsatile tinnitus as well. No other focal neurologic deficits, associated symptoms, inciting events or modifiable factors.  Reviewed notes, labs and imaging from outside physicians, which showed:  Personally reviewed images and agree with the following: Findings: No plain film evidence of cervical spine fracture or malalignment.  On the swimmer's view, there is minimal irregularity of the anterior superior aspect of the T8 vertebral body.  This may be projectional origin.  If there is any suspicion of thoracic spine injury this will require further investigation.   IMPRESSION: No plain film evidence of cervical spine fracture or malalignment  Tsh/cbc/bmp unremarkable  Review of Systems: Patient complains of symptoms per HPI as well as the following symptoms: headaches . Pertinent negatives and positives per HPI. All others negative     Social History   Socioeconomic History   Marital status: Widowed    Spouse name: Not on file   Number of children: 2   Years of education: Not on file   Highest education level: Bachelor's degree (e.g., BA, AB, BS)  Occupational History   Not on file  Tobacco Use   Smoking status: Never   Smokeless tobacco: Never   Tobacco comments:    tried tobacco a few times as a teen, never picked up the habit  Vaping Use   Vaping Use: Never used  Substance and Sexual Activity   Alcohol use: Yes    Alcohol/week: 0.0 standard drinks    Comment: Rare; 2-3 times per year socially   Drug use: No   Sexual activity: Yes    Birth control/protection: None    Comment: 1st intercourse 63 yo-5 partners  Other Topics Concern   Not on file  Social History Narrative   Lives at home with her 2 sons. Has  6 children total, 4 from previous marriage. Husband passed away 1 year ago.   Right handed   Daily less than one cup of coffee & three 8-12 oz cups of decaffeinated soda daily   Social Determinants of Health   Financial Resource Strain: Not on file  Food Insecurity: Not on file  Transportation Needs: Not on file  Physical Activity: Not on file  Stress: Not on file  Social Connections: Not on file  Intimate Partner Violence: Not on file    Family History  Problem Relation Age of Onset   Hypertension Father    Diabetes Father    Hyperlipidemia Father    Alcohol abuse Maternal Grandmother    Alcohol abuse Maternal Grandfather    Heart attack Paternal Grandfather    Cerebral aneurysm Mother    Cerebral aneurysm Maternal Aunt    Migraines Maternal Aunt    Colon cancer Neg Hx    Esophageal cancer Neg Hx    Rectal cancer Neg Hx    Stomach  cancer Neg Hx     Past Medical History:  Diagnosis Date   Allergy    Anemia    Chronic hip pain    Chronic leg pain    bilateral   Depression    Migraine    PMDD (premenstrual dysphoric disorder)    Restless leg    S/P epidural steroid injection    affects legs is seen at pain clinic.    Past Surgical History:  Procedure Laterality Date   CERVICAL CERCLAGE     CESAREAN SECTION     x 2   CESAREAN SECTION  1994   CESAREAN SECTION  1999   KNEE SURGERY Right 11/27/2019   torn meniscus and torn cartilage repaired    Current Outpatient Medications  Medication Sig Dispense Refill   baclofen (LIORESAL) 10 MG tablet Take 1 tablet (10 mg total) by mouth 2 (two) times daily. Take 1/2 to 1 tablet 2 x day if needed for muscle spasm 60 tablet 0   BIOTIN PO 10,000 mcg oral once daily     butalbital-aspirin-caffeine (FIORINAL) 50-325-40 MG capsule TAKE 1 CAPSULE BY MOUTH EVERY 6 HOURS AS NEEDED FOR HEADACHE. 30 MUST LAST 90 DAYS 30 capsule 0   Cetirizine HCl 10 MG CAPS Take 1 capsule (10 mg total) by mouth daily for 10 days. 10 capsule 0    CRANBERRY PO Take by mouth daily. With vitamin C     diclofenac sodium (VOLTAREN) 1 % GEL Apply 1 application topically 3 (three) times daily as needed.  1   docusate sodium (COLACE) 100 MG capsule Take 100 mg by mouth 2 (two) times daily.     DULoxetine (CYMBALTA) 60 MG capsule TAKE 1 CAPSULE BY MOUTH EVERY DAY 90 capsule 0   fluticasone (FLONASE) 50 MCG/ACT nasal spray INSTILL 1 SPRAY INTO BOTH NOSTRILS DAILY 48 mL 0   gabapentin (NEURONTIN) 300 MG capsule TAKE 1 CAPSULE BY MOUTH THREE TIMES A DAY 270 capsule 3   HYDROcodone-acetaminophen (NORCO) 10-325 MG tablet Take 1 tablet by mouth 4 (four) times daily.      ibuprofen (ADVIL) 600 MG tablet Take 1 tablet (600 mg total) by mouth every 6 (six) hours as needed. 30 tablet 0   MELATONIN PO Take by mouth.     morphine (MS CONTIN) 30 MG 12 hr tablet Take 1 tablet by mouth 2 (two) times daily.     Multiple Vitamin (MULTIVITAMIN) capsule Take 1 capsule by mouth daily.     OZEMPIC, 1 MG/DOSE, 4 MG/3ML SOPN INJECT 1 MG SUBCUTANEOUSLY ONCE WEEKLY 3 mL 1   pantoprazole (PROTONIX) 20 MG tablet TAKE 1 TABLET BY MOUTH EVERY DAY 90 tablet 0   promethazine (PHENERGAN) 25 MG tablet Take 1 tablet (25 mg total) by mouth every 6 (six) hours as needed for nausea or vomiting. 30 tablet 11   rizatriptan (MAXALT) 10 MG tablet May repeat in 2 hours if needed 10 tablet 11   Semaglutide,0.25 or 0.5MG /DOS, (OZEMPIC, 0.25 OR 0.5 MG/DOSE,) 2 MG/1.5ML SOPN Inject 1 mg into the skin once a week. 3 mL 1   SUMAtriptan Succinate (ZEMBRACE SYMTOUCH) 3 MG/0.5ML SOAJ Inject 3 mg into the skin every 1 hour as needed for migraine. Max 4 injections in one day. 6 mL 10   VITAMIN D PO Take by mouth.     Current Facility-Administered Medications  Medication Dose Route Frequency Provider Last Rate Last Admin   0.9 %  sodium chloride infusion  500 mL Intravenous Continuous Nandigam, Kavitha V,  MD        Allergies as of 09/14/2021 - Review Complete 07/07/2021  Allergen Reaction Noted    Sulfa antibiotics Swelling and Rash 03/26/2012    Vitals: There were no vitals taken for this visit. Last Weight:  Wt Readings from Last 1 Encounters:  07/07/21 169 lb (76.7 kg)   Last Height:   Ht Readings from Last 1 Encounters:  07/07/21 5\' 5"  (1.651 m)    Physical exam: Exam: Gen: NAD, conversant      CV: attempted, Could not perform over Web Video. Denies palpitations or chest pain or SOB. VS: Breathing at a normal rate. Weight appears within normal limits. Not febrile. Eyes: Conjunctivae clear without exudates or hemorrhage  Neuro: Detailed Neurologic Exam  Speech:    Speech is normal; fluent and spontaneous with normal comprehension.  Cognition:    The patient is oriented to person, place, and time;     recent and remote memory intact;     language fluent;     normal attention, concentration,     fund of knowledge Cranial Nerves:    The pupils are equal, round, and reactive to light. Attempted, Cannot perform fundoscopic exam. Visual fields are full to finger confrontation. Extraocular movements are intact.  The face is symmetric with normal sensation. The palate elevates in the midline. Hearing intact. Voice is normal. Shoulder shrug is normal. The tongue has normal motion without fasciculations.   Coordination:    Normal finger to nose  Gait:    Normal native gait  Motor Observation:   no involuntary movements noted. Tone:    Appears normal  Posture:    Posture is normal. normal erect    Strength:    Strength is anti-gravity and symmetric in the upper and lower limbs.      Sensation: intact to LT      Assessment/Plan: 55 year old with hmigraines  Doing well on current regimen continue Preventative: Continue gabapentin for migraine, may also help for hot flashes Acute: rizatriptan or zembrace based on need RTC 1 year or pcp can refil, patient is very stable  Meds ordered this encounter  Medications   promethazine (PHENERGAN) 25 MG tablet     Sig: Take 1 tablet (25 mg total) by mouth every 6 (six) hours as needed for nausea or vomiting.    Dispense:  30 tablet    Refill:  11   rizatriptan (MAXALT) 10 MG tablet    Sig: May repeat in 2 hours if needed    Dispense:  10 tablet    Refill:  11   gabapentin (NEURONTIN) 300 MG capsule    Sig: TAKE 1 CAPSULE BY MOUTH THREE TIMES A DAY    Dispense:  270 capsule    Refill:  [place], MD  [facility] [address] [place], [place] [address]  Phone [phone] Fax [phone]

## 2021-09-16 ENCOUNTER — Encounter: Payer: Self-pay | Admitting: Neurology

## 2021-09-16 MED ORDER — RIZATRIPTAN BENZOATE 10 MG PO TABS: ORAL_TABLET | ORAL | 11 refills | Status: DC

## 2021-09-16 MED ORDER — PROMETHAZINE HCL 25 MG PO TABS: 25.0000 mg | ORAL_TABLET | Freq: Four times a day (QID) | ORAL | 11 refills | Status: DC | PRN

## 2021-09-16 MED ORDER — GABAPENTIN 300 MG PO CAPS: ORAL_CAPSULE | ORAL | 3 refills | Status: DC

## 2021-09-16 NOTE — Patient Instructions (Signed)
Continue current treatment plan. 

## 2021-09-22 DIAGNOSIS — M1612 Unilateral primary osteoarthritis, left hip: Secondary | ICD-10-CM | POA: Diagnosis not present

## 2021-09-22 DIAGNOSIS — Z79899 Other long term (current) drug therapy: Secondary | ICD-10-CM | POA: Diagnosis not present

## 2021-09-22 DIAGNOSIS — Z79891 Long term (current) use of opiate analgesic: Secondary | ICD-10-CM | POA: Diagnosis not present

## 2021-09-22 DIAGNOSIS — G894 Chronic pain syndrome: Secondary | ICD-10-CM | POA: Diagnosis not present

## 2021-09-22 DIAGNOSIS — M24152 Other articular cartilage disorders, left hip: Secondary | ICD-10-CM | POA: Diagnosis not present

## 2021-09-22 DIAGNOSIS — M7061 Trochanteric bursitis, right hip: Secondary | ICD-10-CM | POA: Diagnosis not present

## 2021-09-26 DIAGNOSIS — M24152 Other articular cartilage disorders, left hip: Secondary | ICD-10-CM | POA: Diagnosis not present

## 2021-09-26 DIAGNOSIS — M1612 Unilateral primary osteoarthritis, left hip: Secondary | ICD-10-CM | POA: Diagnosis not present

## 2021-09-28 ENCOUNTER — Other Ambulatory Visit: Payer: Self-pay | Admitting: Physician Assistant

## 2021-09-28 DIAGNOSIS — M25552 Pain in left hip: Secondary | ICD-10-CM

## 2021-09-30 ENCOUNTER — Other Ambulatory Visit: Payer: Self-pay | Admitting: Neurology

## 2021-10-24 DIAGNOSIS — G894 Chronic pain syndrome: Secondary | ICD-10-CM | POA: Diagnosis not present

## 2021-10-24 DIAGNOSIS — M1612 Unilateral primary osteoarthritis, left hip: Secondary | ICD-10-CM | POA: Diagnosis not present

## 2021-10-24 DIAGNOSIS — M24152 Other articular cartilage disorders, left hip: Secondary | ICD-10-CM | POA: Diagnosis not present

## 2021-10-24 DIAGNOSIS — M7061 Trochanteric bursitis, right hip: Secondary | ICD-10-CM | POA: Diagnosis not present

## 2021-11-08 ENCOUNTER — Other Ambulatory Visit: Payer: Federal, State, Local not specified - PPO

## 2021-11-23 DIAGNOSIS — M7061 Trochanteric bursitis, right hip: Secondary | ICD-10-CM | POA: Diagnosis not present

## 2021-11-23 DIAGNOSIS — M1612 Unilateral primary osteoarthritis, left hip: Secondary | ICD-10-CM | POA: Diagnosis not present

## 2021-11-23 DIAGNOSIS — G894 Chronic pain syndrome: Secondary | ICD-10-CM | POA: Diagnosis not present

## 2021-11-23 DIAGNOSIS — M24152 Other articular cartilage disorders, left hip: Secondary | ICD-10-CM | POA: Diagnosis not present

## 2021-12-14 ENCOUNTER — Encounter: Payer: Self-pay | Admitting: Physician Assistant

## 2021-12-14 ENCOUNTER — Ambulatory Visit (INDEPENDENT_AMBULATORY_CARE_PROVIDER_SITE_OTHER): Payer: Federal, State, Local not specified - PPO | Admitting: Physician Assistant

## 2021-12-14 ENCOUNTER — Other Ambulatory Visit: Payer: Self-pay

## 2021-12-14 VITALS — BP 139/64 | HR 84 | Temp 98.3°F | Ht 65.0 in | Wt 191.0 lb

## 2021-12-14 DIAGNOSIS — J0140 Acute pansinusitis, unspecified: Secondary | ICD-10-CM

## 2021-12-14 DIAGNOSIS — R0981 Nasal congestion: Secondary | ICD-10-CM | POA: Diagnosis not present

## 2021-12-14 DIAGNOSIS — R110 Nausea: Secondary | ICD-10-CM

## 2021-12-14 LAB — POCT INFLUENZA A/B
Influenza A, POC: NEGATIVE
Influenza B, POC: NEGATIVE

## 2021-12-14 MED ORDER — AZITHROMYCIN 250 MG PO TABS
ORAL_TABLET | ORAL | 0 refills | Status: AC
Start: 2021-12-14 — End: 2021-12-19

## 2021-12-14 MED ORDER — FLUCONAZOLE 150 MG PO TABS: 150.0000 mg | ORAL_TABLET | Freq: Once | ORAL | 0 refills | Status: AC

## 2021-12-14 NOTE — Progress Notes (Signed)
Subjective:     Christine Frank is a 56 y.o. female who presents for evaluation of possible sinusitis. Symptoms include achiness, facial pain, headache described as migraine, nasal congestion, nausea without vomiting, post nasal drip, sinus pressure, and tooth pain. Also reports nausea and fatigue. Onset of symptoms was  more than 10 days ago.  and has been gradually worsening since that time. Treatment to date: decongestants (Mucinex) which has provided some relief. Did at-home Covid test which resulted negative.   The following portions of the patient's history were reviewed and updated as appropriate: allergies, current medications, past family history, past medical history, past social history, past surgical history, and problem list.   Review of Systems Pertinent items noted in HPI and remainder of comprehensive ROS otherwise negative.   Objective:    BP 139/64    Pulse 84    Temp 98.3 F (36.8 C)    Ht 5\' 5"  (1.651 m)    Wt 191 lb (86.6 kg)    SpO2 96%    BMI 31.78 kg/m   Physical exam:  General:  Well Developed, well nourished, appropriate for stated age.  Neuro:  Alert and oriented,  extra-ocular muscles intact  HEENT:  Normocephalic, atraumatic, frontal and maxillary sinus tenderness, normal TM's of both ears, boggy turbinates (r>L), erythema of posterior oropharynx, neck supple, +cervical adenopathy   Skin:  no gross rash, warm, pink. Cardiac:  RRR, S1 S2 Respiratory: CTA B/L Vascular:  Ext warm, no cyanosis apprec.; cap RF less 2 sec. Psych:  No HI/SI, judgement and insight good, Euthymic mood. Full Affect.   Assessment:    sinusitis   Plan:   Influenza test resulted negative. Patient has s/sx suggestive of bacterial sinusitis so will start oral antibiotic therapy. Patient reports hx of yeast infection with antibiotic use so will send 1 dose of diflucan. Continue home supportive care. Follow up if symptoms fail to improve or worsen.

## 2021-12-14 NOTE — Addendum Note (Signed)
Addended by: Aron Baba on: 12/14/2021 02:04 PM   Modules accepted: Orders

## 2021-12-14 NOTE — Patient Instructions (Signed)

## 2021-12-16 ENCOUNTER — Other Ambulatory Visit: Payer: Self-pay | Admitting: Physician Assistant

## 2022-01-03 ENCOUNTER — Telehealth: Payer: Self-pay | Admitting: Physician Assistant

## 2022-01-03 NOTE — Telephone Encounter (Signed)
Patient requesting refill of Cymbalta however it needs to go to [facility] instead of [facility]. Please advise. [phone]

## 2022-01-04 ENCOUNTER — Other Ambulatory Visit: Payer: Self-pay | Admitting: Physician Assistant

## 2022-01-04 DIAGNOSIS — M1612 Unilateral primary osteoarthritis, left hip: Secondary | ICD-10-CM | POA: Diagnosis not present

## 2022-01-04 DIAGNOSIS — M7061 Trochanteric bursitis, right hip: Secondary | ICD-10-CM | POA: Diagnosis not present

## 2022-01-04 DIAGNOSIS — M24152 Other articular cartilage disorders, left hip: Secondary | ICD-10-CM | POA: Diagnosis not present

## 2022-01-04 DIAGNOSIS — F32A Depression, unspecified: Secondary | ICD-10-CM

## 2022-01-04 DIAGNOSIS — G894 Chronic pain syndrome: Secondary | ICD-10-CM | POA: Diagnosis not present

## 2022-01-04 MED ORDER — DULOXETINE HCL 60 MG PO CPEP: ORAL_CAPSULE | ORAL | 1 refills | Status: DC

## 2022-01-04 NOTE — Telephone Encounter (Signed)
Left detailed message for patient.

## 2022-01-04 NOTE — Progress Notes (Signed)
Established Patient Office Visit  Subjective:  Patient ID: Christine Frank, female    DOB: 1965/12/28  Age: 56 y.o. MRN: [id number]  CC:  Chief Complaint  Patient presents with   Follow-up    HPI Christine Frank presents for discussion of changing mood medication. Reports lost her husband 4 years ago and Prozac was added to Duloxetine to help with the depression. Before being on Duloxetine was on Lexapro and medication was changed to also help with her hip pain. Denies recent major life events or stressor. States her stress levels have been good. Does not have motivation to do things and struggles to get up in the mornings. Denies SI/HI.   Depression screen [facility] 2/9 01/10/2022 12/14/2021 07/07/2021 05/10/2021 03/28/2021  Decreased Interest 3 3 0 0 0  Down, Depressed, Hopeless 3 3 0 0 0  PHQ - 2 Score 6 6 0 0 0  Altered sleeping 3 3 0 0 0  Tired, decreased energy 3 3 0 0 1  Change in appetite 3 3 0 0 1  Feeling bad or failure about yourself  1 3 0 0 0  Trouble concentrating 1 0 0 0 0  Moving slowly or fidgety/restless 0 0 0 0 0  Suicidal thoughts 0 0 0 0 0  PHQ-9 Score 17 18 0 0 2  Difficult doing work/chores Extremely dIfficult Very difficult - - Not difficult at all  Some recent data might be hidden    Past Medical History:  Diagnosis Date   Allergy    Anemia    Chronic hip pain    Chronic leg pain    bilateral   Depression    Migraine    PMDD (premenstrual dysphoric disorder)    Restless leg    S/P epidural steroid injection    affects legs is seen at pain clinic.    Past Surgical History:  Procedure Laterality Date   CERVICAL CERCLAGE     CESAREAN SECTION     x 2   CESAREAN SECTION  1994   CESAREAN SECTION  1999   KNEE SURGERY Right 11/27/2019   torn meniscus and torn cartilage repaired    Family History  Problem Relation Age of Onset   Hypertension Father    Diabetes Father    Hyperlipidemia Father    Alcohol abuse Maternal Grandmother     Alcohol abuse Maternal Grandfather    Heart attack Paternal Grandfather    Cerebral aneurysm Mother    Cerebral aneurysm Maternal Aunt    Migraines Maternal Aunt    Colon cancer Neg Hx    Esophageal cancer Neg Hx    Rectal cancer Neg Hx    Stomach cancer Neg Hx     Social History   Socioeconomic History   Marital status: Widowed    Spouse name: Not on file   Number of children: 2   Years of education: Not on file   Highest education level: Bachelor's degree (e.g., BA, AB, BS)  Occupational History   Not on file  Tobacco Use   Smoking status: Never   Smokeless tobacco: Never   Tobacco comments:    tried tobacco a few times as a teen, never picked up the habit  Vaping Use   Vaping Use: Never used  Substance and Sexual Activity   Alcohol use: Yes    Alcohol/week: 0.0 standard drinks    Comment: Rare; 2-3 times per year socially   Drug use: No   Sexual activity: Yes  Birth control/protection: None    Comment: 1st intercourse 62 yo-5 partners  Other Topics Concern   Not on file  Social History Narrative   Lives at home with her 2 sons. Has 6 children total, 4 from previous marriage. Husband passed away 1 year ago.   Right handed   Daily less than one cup of coffee & three 8-12 oz cups of decaffeinated soda daily   Social Determinants of Health   Financial Resource Strain: Not on file  Food Insecurity: Not on file  Transportation Needs: Not on file  Physical Activity: Not on file  Stress: Not on file  Social Connections: Not on file  Intimate Partner Violence: Not on file    Outpatient Medications Prior to Visit  Medication Sig Dispense Refill   baclofen (LIORESAL) 10 MG tablet Take 1 tablet (10 mg total) by mouth 2 (two) times daily. Take 1/2 to 1 tablet 2 x day if needed for muscle spasm 60 tablet 0   BIOTIN PO 10,000 mcg oral once daily     CRANBERRY PO Take by mouth daily. With vitamin C     diclofenac sodium (VOLTAREN) 1 % GEL Apply 1 application topically  3 (three) times daily as needed.  1   docusate sodium (COLACE) 100 MG capsule Take 100 mg by mouth 2 (two) times daily.     fluticasone (FLONASE) 50 MCG/ACT nasal spray INSTILL 1 SPRAY INTO BOTH NOSTRILS DAILY 48 mL 0   gabapentin (NEURONTIN) 300 MG capsule TAKE 1 CAPSULE BY MOUTH THREE TIMES A DAY 270 capsule 3   HYDROcodone-acetaminophen (NORCO) 10-325 MG tablet Take 1 tablet by mouth 4 (four) times daily.      ibuprofen (ADVIL) 600 MG tablet Take 1 tablet (600 mg total) by mouth every 6 (six) hours as needed. 30 tablet 0   MELATONIN PO Take by mouth.     morphine (MS CONTIN) 30 MG 12 hr tablet Take 1 tablet by mouth 2 (two) times daily.     Multiple Vitamin (MULTIVITAMIN) capsule Take 1 capsule by mouth daily.     OZEMPIC, 1 MG/DOSE, 4 MG/3ML SOPN INJECT 1 MG SUBCUTANEOUSLY ONCE WEEKLY 3 mL 1   promethazine (PHENERGAN) 25 MG tablet Take 1 tablet (25 mg total) by mouth every 6 (six) hours as needed for nausea or vomiting. 30 tablet 11   rizatriptan (MAXALT) 10 MG tablet May repeat in 2 hours if needed 10 tablet 11   Semaglutide,0.25 or 0.5MG /DOS, (OZEMPIC, 0.25 OR 0.5 MG/DOSE,) 2 MG/1.5ML SOPN Inject 1 mg into the skin once a week. 3 mL 1   SUMAtriptan Succinate (ZEMBRACE SYMTOUCH) 3 MG/0.5ML SOAJ Inject 3 mg into the skin every 1 hour as needed for migraine. Max 4 injections in one day. 6 mL 10   VITAMIN D PO Take by mouth.     DULoxetine (CYMBALTA) 60 MG capsule TAKE 1 CAPSULE BY MOUTH EVERY DAY 90 capsule 1   Cetirizine HCl 10 MG CAPS Take 1 capsule (10 mg total) by mouth daily for 10 days. 10 capsule 0   Facility-Administered Medications Prior to Visit  Medication Dose Route Frequency Provider Last Rate Last Admin   0.9 %  sodium chloride infusion  500 mL Intravenous Continuous Nandigam, Kavitha V, MD        Allergies  Allergen Reactions   Sulfa Antibiotics Swelling and Rash    ROS Review of Systems Review of Systems:  A fourteen system review of systems was performed and found  to be positive  as per HPI.   Objective:    Physical Exam General:  Well Developed, well nourished, appropriate for stated age.  Neuro:  Alert and oriented,  extra-ocular muscles intact  HEENT:  Normocephalic, atraumatic, neck supple  Skin:  no gross rash, warm, pink. Cardiac:  RRR, S1 S2 Respiratory: CTA B/L  Vascular:  Ext warm, no cyanosis apprec.; cap RF less 2 sec. Psych:  No HI/SI, judgement and insight good, tearful- mood. Full Affect.  BP 123/84    Pulse 98    Temp (!) 97.2 F (36.2 C)    Ht 5\' 5"  (1.651 m)    Wt 186 lb (84.4 kg)    SpO2 96%    BMI 30.95 kg/m  Wt Readings from Last 3 Encounters:  01/10/22 186 lb (84.4 kg)  12/14/21 191 lb (86.6 kg)  07/07/21 169 lb (76.7 kg)     Health Maintenance Due  Topic Date Due   HIV Screening  Never done   Hepatitis C Screening  Never done   COVID-19 Vaccine (3 - Booster for Moderna series) 07/28/2020   Zoster Vaccines- Shingrix (2 of 2) 11/08/2020   INFLUENZA VACCINE  06/27/2021    There are no preventive care reminders to display for this patient.  Lab Results  Component Value Date   TSH 1.710 09/06/2020   Lab Results  Component Value Date   WBC 7.7 09/06/2020   HGB 12.0 09/06/2020   HCT 36.0 09/06/2020   MCV 89 09/06/2020   PLT 406 09/06/2020   Lab Results  Component Value Date   NA 140 09/06/2020   K 4.0 09/06/2020   CO2 26 09/06/2020   GLUCOSE 103 (H) 09/06/2020   BUN 8 09/06/2020   CREATININE 0.72 09/06/2020   BILITOT <0.2 09/06/2020   ALKPHOS 92 09/06/2020   AST 18 09/06/2020   ALT 15 09/06/2020   PROT 6.9 09/06/2020   ALBUMIN 4.2 09/06/2020   CALCIUM 9.7 09/06/2020   ANIONGAP 18 (H) 06/16/2018   Lab Results  Component Value Date   CHOL 191 09/06/2020   Lab Results  Component Value Date   HDL 55 09/06/2020   Lab Results  Component Value Date   LDLCALC 93 09/06/2020   Lab Results  Component Value Date   TRIG 256 (H) 09/06/2020   Lab Results  Component Value Date   CHOLHDL 3.5  09/06/2020   Lab Results  Component Value Date   HGBA1C 5.5 09/06/2020      Assessment & Plan:   Problem List Items Addressed This Visit       Other   Depression - Primary    -PHQ-9 score significantly higher than baseline. Discussed with patient treatment adjustments and will increase Duloxetine to 90 mg. If mood fails to improve then recommend changing medication to SSRI such as Prozac. Patient verbalized understanding and agreeable. Encourage to use good support system at home. Will reassess mood and medication therapy in 6 weeks. Advised if mood significantly worsens to contact the office and will start Prozac sooner.      Relevant Medications   DULoxetine (CYMBALTA) 30 MG capsule    Meds ordered this encounter  Medications   DULoxetine (CYMBALTA) 30 MG capsule    Sig: Take 3 capsules (90 mg total) by mouth daily.    Dispense:  90 capsule    Refill:  1    Order Specific Question:   Supervising Provider    Answer:   Beatrice Lecher D [[id number]]    Follow-up: Return in  about 6 weeks (around 02/21/2022) for [employer] [facility].    Lorrene Reid, PA-C

## 2022-01-10 ENCOUNTER — Ambulatory Visit: Payer: Federal, State, Local not specified - PPO | Admitting: Physician Assistant

## 2022-01-10 ENCOUNTER — Encounter: Payer: Self-pay | Admitting: Physician Assistant

## 2022-01-10 ENCOUNTER — Other Ambulatory Visit: Payer: Self-pay

## 2022-01-10 VITALS — BP 123/84 | HR 98 | Temp 97.2°F | Ht 65.0 in | Wt 186.0 lb

## 2022-01-10 DIAGNOSIS — F339 Major depressive disorder, recurrent, unspecified: Secondary | ICD-10-CM

## 2022-01-10 MED ORDER — DULOXETINE HCL 30 MG PO CPEP: 90.0000 mg | ORAL_CAPSULE | Freq: Every day | ORAL | 1 refills | Status: DC

## 2022-01-10 NOTE — Assessment & Plan Note (Addendum)
-  PHQ-9 score significantly higher than baseline. Discussed with patient treatment adjustments and will increase Duloxetine to 90 mg. If mood fails to improve then recommend changing medication to SSRI such as Prozac. Patient verbalized understanding and agreeable. Encourage to use good support system at home. Will reassess mood and medication therapy in 6 weeks. Advised if mood significantly worsens to contact the office and will start Prozac sooner.

## 2022-01-10 NOTE — Patient Instructions (Signed)
Major Depressive Disorder, Adult Major depressive disorder is a mental health condition. This disorder affects feelings. It can also affect the body. Symptoms of this condition last most of the day, almost every day, for 2 weeks. This disorder can affect: Relationships. Daily activities, such as work and school. Activities that you normally like to do. What are the causes? The cause of this condition is not known. The disorder is likely caused by a mix of things, including: Your personality, such as being a shy person. Your behavior, or how you act toward others. Your thoughts and feelings. Too much alcohol or drugs. How you react to stress. Health and mental problems that you have had for a long time. Things that hurt you in the past (trauma). Big changes in your life, such as divorce. What increases the risk? The following factors may make you more likely to develop this condition: Having family members with depression. Being a woman. Problems in the family. Low levels of some brain chemicals. Things that caused you pain as a child, especially if you lost a parent or were abused. A lot of stress in your life, such as from: Living without basic needs of life, such as food and shelter. Being treated poorly because of race, sex, or religion (discrimination). Health and mental problems that you have had for a long time. What are the signs or symptoms? The main symptoms of this condition are: Being sad all the time. Being grouchy all the time. Loss of interest in things and activities. Other symptoms include: Sleeping too much or too little. Eating too much or too little. Gaining or losing weight, without knowing why. Feeling tired or having low energy. Being restless and weak. Feeling hopeless, worthless, or guilty. Trouble thinking clearly or making decisions. Thoughts of hurting yourself or others, or thoughts of ending your life. Spending a lot of time alone. Inability to  complete common tasks of daily life. If you have very bad MDD, you may: Believe things that are not true. Hear, see, taste, or feel things that are not there. Have mild depression that lasts for at least 2 years. Feel very sad and hopeless. Have trouble speaking or moving. How is this treated? This condition may be treated with: Talk therapy. This teaches you to know bad thoughts, feelings, and actions and how to change them. This can also help you to communicate with others. This can be done with members of your family. Medicines. These can be used to treat worry (anxiety), depression, or low levels of chemicals in the brain. Lifestyle changes. You may need to: Limit alcohol use. Limit drug use. Get regular exercise. Get plenty of sleep. Make healthy eating choices. Spend more time outdoors. Brain stimulation. This treatment excites the brain. This is done when symptoms are very bad or have not gotten better with other treatments. Follow these instructions at home: Activity Get regular exercise as told. Spend time outdoors as told. Make time to do the things you enjoy. Find ways to deal with stress. Try to: Meditate. Do deep breathing. Spend time in nature. Keep a journal. Return to your normal activities as told by your doctor. Ask your doctor what activities are safe for you. Alcohol and drug use If you drink alcohol: Limit how much you use to: 0-1 drink a day for women. 0-2 drinks a day for men. Be aware of how much alcohol is in your drink. In the [place] one drink equals one 12 oz bottle of beer (355 mL),  one 5 oz glass of wine (148 mL), or one 1 oz glass of hard liquor (44 mL). Talk to your doctor about: Alcohol use. Alcohol can affect some medicines. Any drug use. General instructions  Take over-the-counter and prescription medicines and herbal preparations only as told by your doctor. Eat a healthy diet. Get a lot of sleep. Think about joining a support group.  Your doctor may be able to suggest one. Keep all follow-up visits as told by your doctor. This is important. Where to find more information: [employer] on Mental Illness: www.nami.[place]: https://carter.com/ American Psychiatric Association: www.psychiatry.org/patients-families/ Contact a doctor if: Your symptoms get worse. You get new symptoms. Get help right away if: You hurt yourself. You have serious thoughts about hurting yourself or others. You see, hear, taste, smell, or feel things that are not there. If you ever feel like you may hurt yourself or others, or have thoughts about taking your own life, get help right away. Go to your nearest emergency department or: Call your local emergency services (911 in the [place] Call a suicide crisis helpline, such as the [place] at 365 662 8776 or 988 in the [facility]. This is open 24 hours a day in the [place] Text the Crisis Text Line at (765) 344-8425 (in the [place].). Summary Major depressive disorder is a mental health condition. This disorder affects feelings. Symptoms of this condition last most of the day, almost every day, for 2 weeks. The symptoms of this disorder can cause problems with relationships and with daily activities. There are treatments and support for people who get this disorder. You may need more than one type of treatment. Get help right away if you have serious thoughts about hurting yourself or others. This information is not intended to replace advice given to you by your health care provider. Make sure you discuss any questions you have with your health care provider. Document Revised: 06/08/2021 Document Reviewed: 10/25/2019 Elsevier Patient Education  2022 [employer].

## 2022-01-29 ENCOUNTER — Other Ambulatory Visit: Payer: Self-pay | Admitting: Physician Assistant

## 2022-01-29 DIAGNOSIS — F32A Depression, unspecified: Secondary | ICD-10-CM

## 2022-02-13 DIAGNOSIS — M545 Low back pain, unspecified: Secondary | ICD-10-CM | POA: Diagnosis not present

## 2022-02-20 DIAGNOSIS — Z79899 Other long term (current) drug therapy: Secondary | ICD-10-CM | POA: Diagnosis not present

## 2022-02-20 DIAGNOSIS — L639 Alopecia areata, unspecified: Secondary | ICD-10-CM | POA: Diagnosis not present

## 2022-02-20 DIAGNOSIS — R5383 Other fatigue: Secondary | ICD-10-CM | POA: Diagnosis not present

## 2022-02-20 DIAGNOSIS — L638 Other alopecia areata: Secondary | ICD-10-CM | POA: Diagnosis not present

## 2022-02-20 NOTE — Progress Notes (Signed)
? ?Established Patient Office Visit ? ?Subjective:  ?Patient ID: Christine Frank, female    DOB: February 05, 1966  Age: 56 y.o. MRN: [id number] ? ?CC:  ?Chief Complaint  ?Patient presents with  ? Follow-up  ?  Mood  ? ? ?HPI ?Erich Montane presents for follow up on mood management. Cymbalta was increased to 90 mg and patient reports has noticed a difference with her mood. States has been more active and doing more things at home such as cleaning the yard. Not feeling as emotional and tearful. Patient also requesting new referral to specialist for left hip pain. States has been to [facility] for her knee. Currently followed by pain management and her doctor (Dr.Spivey) has retired, there's new management and if having a difficult time getting in touch with the office, states no one answers the phone. Requesting refill of Flonase and Ozempic. ? ?Past Medical History:  ?Diagnosis Date  ? Allergy   ? Anemia   ? Chronic hip pain   ? Chronic leg pain   ? bilateral  ? Depression   ? Migraine   ? PMDD (premenstrual dysphoric disorder)   ? Restless leg   ? S/P epidural steroid injection   ? affects legs is seen at pain clinic.  ? ? ?Past Surgical History:  ?Procedure Laterality Date  ? CERVICAL CERCLAGE    ? CESAREAN SECTION    ? x 2  ? [place]  ? [place]  ? KNEE SURGERY Right 11/27/2019  ? torn meniscus and torn cartilage repaired  ? ? ?Family History  ?Problem Relation Age of Onset  ? Hypertension Father   ? Diabetes Father   ? Hyperlipidemia Father   ? Alcohol abuse Maternal Grandmother   ? Alcohol abuse Maternal Grandfather   ? Heart attack Paternal Grandfather   ? Cerebral aneurysm Mother   ? Cerebral aneurysm Maternal Aunt   ? Migraines Maternal Aunt   ? Colon cancer Neg Hx   ? Esophageal cancer Neg Hx   ? Rectal cancer Neg Hx   ? Stomach cancer Neg Hx   ? ? ?Social History  ? ?Socioeconomic History  ? Marital status: Widowed  ?  Spouse name: Not on file  ? Number of children: 2  ?  Years of education: Not on file  ? Highest education level: Bachelor's degree (e.g., BA, AB, BS)  ?Occupational History  ? Not on file  ?Tobacco Use  ? Smoking status: Never  ? Smokeless tobacco: Never  ? Tobacco comments:  ?  tried tobacco a few times as a teen, never picked up the habit  ?Vaping Use  ? Vaping Use: Never used  ?Substance and Sexual Activity  ? Alcohol use: Yes  ?  Alcohol/week: 0.0 standard drinks  ?  Comment: Rare; 2-3 times per year socially  ? Drug use: No  ? Sexual activity: Yes  ?  Birth control/protection: None  ?  Comment: 1st intercourse 42 yo-5 partners  ?Other Topics Concern  ? Not on file  ?Social History Narrative  ? Lives at home with her 2 sons. Has 6 children total, 4 from previous marriage. Husband passed away 1 year ago.  ? Right handed  ? Daily less than one cup of coffee & three 8-12 oz cups of decaffeinated soda daily  ? ?Social Determinants of Health  ? ?Financial Resource Strain: Not on file  ?Food Insecurity: Not on file  ?Transportation Needs: Not on file  ?Physical Activity:  Not on file  ?Stress: Not on file  ?Social Connections: Not on file  ?Intimate Partner Violence: Not on file  ? ? ?Outpatient Medications Prior to Visit  ?Medication Sig Dispense Refill  ? baclofen (LIORESAL) 10 MG tablet Take 1 tablet (10 mg total) by mouth 2 (two) times daily. Take 1/2 to 1 tablet 2 x day if needed for muscle spasm 60 tablet 0  ? BIOTIN PO 10,000 mcg oral once daily    ? CRANBERRY PO Take by mouth daily. With vitamin C    ? diclofenac sodium (VOLTAREN) 1 % GEL Apply 1 application topically 3 (three) times daily as needed.  1  ? docusate sodium (COLACE) 100 MG capsule Take 100 mg by mouth 2 (two) times daily.    ? DULoxetine (CYMBALTA) 30 MG capsule Take 3 capsules (90 mg total) by mouth daily. 90 capsule 1  ? gabapentin (NEURONTIN) 300 MG capsule TAKE 1 CAPSULE BY MOUTH THREE TIMES A DAY 270 capsule 3  ? HYDROcodone-acetaminophen (NORCO) 10-325 MG tablet Take 1 tablet by mouth 4  (four) times daily.     ? ibuprofen (ADVIL) 600 MG tablet Take 1 tablet (600 mg total) by mouth every 6 (six) hours as needed. 30 tablet 0  ? MELATONIN PO Take by mouth.    ? morphine (MS CONTIN) 30 MG 12 hr tablet Take 1 tablet by mouth 2 (two) times daily.    ? Multiple Vitamin (MULTIVITAMIN) capsule Take 1 capsule by mouth daily.    ? promethazine (PHENERGAN) 25 MG tablet Take 1 tablet (25 mg total) by mouth every 6 (six) hours as needed for nausea or vomiting. 30 tablet 11  ? rizatriptan (MAXALT) 10 MG tablet May repeat in 2 hours if needed 10 tablet 11  ? SUMAtriptan Succinate (ZEMBRACE SYMTOUCH) 3 MG/0.5ML SOAJ Inject 3 mg into the skin every 1 hour as needed for migraine. Max 4 injections in one day. 6 mL 10  ? VITAMIN D PO Take by mouth.    ? DULoxetine (CYMBALTA) 60 MG capsule TAKE 1 CAPSULE BY MOUTH EVERY DAY 90 capsule 0  ? fluticasone (FLONASE) 50 MCG/ACT nasal spray INSTILL 1 SPRAY INTO BOTH NOSTRILS DAILY 48 mL 0  ? OZEMPIC, 1 MG/DOSE, 4 MG/3ML SOPN INJECT 1 MG SUBCUTANEOUSLY ONCE WEEKLY 3 mL 1  ? Semaglutide,0.25 or 0.'5MG'$ /DOS, (OZEMPIC, 0.25 OR 0.5 MG/DOSE,) 2 MG/1.5ML SOPN Inject 1 mg into the skin once a week. 3 mL 1  ? Cetirizine HCl 10 MG CAPS Take 1 capsule (10 mg total) by mouth daily for 10 days. 10 capsule 0  ? ?Facility-Administered Medications Prior to Visit  ?Medication Dose Route Frequency Provider Last Rate Last Admin  ? 0.9 %  sodium chloride infusion  500 mL Intravenous Continuous Nandigam, Venia Minks, MD      ? ? ?Allergies  ?Allergen Reactions  ? Sulfa Antibiotics Swelling and Rash  ? ? ?ROS ?Review of Systems ?Review of Systems:  ?A fourteen system review of systems was performed and found to be positive as per HPI. ? ?  ?Objective:  ?  ?Physical Exam ?General:  Cooperative, in no acute distress, appropriate for stated age.  ?Neuro:  Alert and oriented,  extra-ocular muscles intact  ?HEENT:  Normocephalic, atraumatic, neck supple  ?Skin:  no gross rash, warm, pink. ?Cardiac:  RRR, S1  S2 ?Respiratory: CTA B/L  ?Vascular:  Ext warm, no cyanosis apprec.; cap RF less 2 sec. ?Psych:  No HI/SI, judgement and insight good, Euthymic mood. Full  Affect. ? ?BP 108/71   Pulse (!) 112   Temp (!) 97.4 ?F (36.3 ?C)   Ht '5\' 5"'$  (1.651 m)   Wt 185 lb (83.9 kg)   SpO2 98%   BMI 30.79 kg/m?  ?Wt Readings from Last 3 Encounters:  ?02/21/22 185 lb (83.9 kg)  ?01/10/22 186 lb (84.4 kg)  ?12/14/21 191 lb (86.6 kg)  ? ? ? ?Health Maintenance Due  ?Topic Date Due  ? HIV Screening  Never done  ? Hepatitis C Screening  Never done  ? COVID-19 Vaccine (3 - Booster for Moderna series) 07/28/2020  ? Zoster Vaccines- Shingrix (2 of 2) 11/08/2020  ? INFLUENZA VACCINE  06/27/2021  ? COLONOSCOPY (Pts 45-8yr Insurance coverage will need to be confirmed)  02/21/2022  ? ? ?There are no preventive care reminders to display for this patient. ? ?Lab Results  ?Component Value Date  ? TSH 1.710 09/06/2020  ? ?Lab Results  ?Component Value Date  ? WBC 7.7 09/06/2020  ? HGB 12.0 09/06/2020  ? HCT 36.0 09/06/2020  ? MCV 89 09/06/2020  ? PLT 406 09/06/2020  ? ?Lab Results  ?Component Value Date  ? NA 140 09/06/2020  ? K 4.0 09/06/2020  ? CO2 26 09/06/2020  ? GLUCOSE 103 (H) 09/06/2020  ? BUN 8 09/06/2020  ? CREATININE 0.72 09/06/2020  ? BILITOT <0.2 09/06/2020  ? ALKPHOS 92 09/06/2020  ? AST 18 09/06/2020  ? ALT 15 09/06/2020  ? PROT 6.9 09/06/2020  ? ALBUMIN 4.2 09/06/2020  ? CALCIUM 9.7 09/06/2020  ? ANIONGAP 18 (H) 06/16/2018  ? ?Lab Results  ?Component Value Date  ? CHOL 191 09/06/2020  ? ?Lab Results  ?Component Value Date  ? HDL 55 09/06/2020  ? ?Lab Results  ?Component Value Date  ? [place]93 09/06/2020  ? ?Lab Results  ?Component Value Date  ? TRIG 256 (H) 09/06/2020  ? ?Lab Results  ?Component Value Date  ? CHOLHDL 3.5 09/06/2020  ? ?Lab Results  ?Component Value Date  ? HGBA1C 5.5 09/06/2020  ? ? ?  02/21/2022  ?  2:57 PM 01/10/2022  ?  3:03 PM 12/14/2021  ?  2:03 PM 07/07/2021  ? 12:02 PM 05/10/2021  ?  2:39 PM  ?Depression  screen PHQ 2/9  ?Decreased Interest '1 3 3 '$ 0 0  ?Down, Depressed, Hopeless 0 3 3 0 0  ?PHQ - 2 Score '1 6 6 '$ 0 0  ?Altered sleeping '1 3 3 '$ 0 0  ?Tired, decreased energy '1 3 3 '$ 0 0  ?Change in appetite '1 3 3 '$ 0 0  ?Feelin

## 2022-02-21 ENCOUNTER — Other Ambulatory Visit: Payer: Self-pay

## 2022-02-21 ENCOUNTER — Ambulatory Visit: Payer: Federal, State, Local not specified - PPO | Admitting: Physician Assistant

## 2022-02-21 ENCOUNTER — Encounter: Payer: Self-pay | Admitting: Physician Assistant

## 2022-02-21 VITALS — BP 108/71 | HR 112 | Temp 97.4°F | Ht 65.0 in | Wt 185.0 lb

## 2022-02-21 DIAGNOSIS — M25552 Pain in left hip: Secondary | ICD-10-CM

## 2022-02-21 DIAGNOSIS — J3089 Other allergic rhinitis: Secondary | ICD-10-CM | POA: Diagnosis not present

## 2022-02-21 DIAGNOSIS — E669 Obesity, unspecified: Secondary | ICD-10-CM

## 2022-02-21 DIAGNOSIS — F339 Major depressive disorder, recurrent, unspecified: Secondary | ICD-10-CM | POA: Diagnosis not present

## 2022-02-21 MED ORDER — FLUTICASONE PROPIONATE 50 MCG/ACT NA SUSP: NASAL | 0 refills | Status: DC

## 2022-02-21 MED ORDER — OZEMPIC (1 MG/DOSE) 4 MG/3ML ~~LOC~~ SOPN: PEN_INJECTOR | SUBCUTANEOUS | 1 refills | Status: DC

## 2022-02-21 NOTE — Assessment & Plan Note (Signed)
-  Improved. PHQ-9 score has improved from 17 to 4. Will continue duloxetine 90 mg. Will continue to monitor. ?

## 2022-03-01 DIAGNOSIS — M25552 Pain in left hip: Secondary | ICD-10-CM | POA: Diagnosis not present

## 2022-03-08 ENCOUNTER — Encounter: Payer: Self-pay | Admitting: Gastroenterology

## 2022-03-14 ENCOUNTER — Other Ambulatory Visit: Payer: Self-pay | Admitting: Physician Assistant

## 2022-03-14 DIAGNOSIS — F339 Major depressive disorder, recurrent, unspecified: Secondary | ICD-10-CM

## 2022-03-17 DIAGNOSIS — M25552 Pain in left hip: Secondary | ICD-10-CM | POA: Diagnosis not present

## 2022-04-17 DIAGNOSIS — M25552 Pain in left hip: Secondary | ICD-10-CM | POA: Diagnosis not present

## 2022-05-15 DIAGNOSIS — M25552 Pain in left hip: Secondary | ICD-10-CM | POA: Diagnosis not present

## 2022-05-15 DIAGNOSIS — Z79891 Long term (current) use of opiate analgesic: Secondary | ICD-10-CM | POA: Diagnosis not present

## 2022-05-18 ENCOUNTER — Other Ambulatory Visit: Payer: Self-pay | Admitting: Physician Assistant

## 2022-05-18 DIAGNOSIS — F339 Major depressive disorder, recurrent, unspecified: Secondary | ICD-10-CM

## 2022-05-29 ENCOUNTER — Encounter: Payer: Federal, State, Local not specified - PPO | Admitting: Physician Assistant

## 2022-06-01 DIAGNOSIS — M25552 Pain in left hip: Secondary | ICD-10-CM | POA: Diagnosis not present

## 2022-06-13 DIAGNOSIS — M1610 Unilateral primary osteoarthritis, unspecified hip: Secondary | ICD-10-CM | POA: Diagnosis not present

## 2022-07-04 DIAGNOSIS — M1610 Unilateral primary osteoarthritis, unspecified hip: Secondary | ICD-10-CM | POA: Diagnosis not present

## 2022-07-25 ENCOUNTER — Other Ambulatory Visit: Payer: Self-pay | Admitting: Physician Assistant

## 2022-07-25 DIAGNOSIS — F339 Major depressive disorder, recurrent, unspecified: Secondary | ICD-10-CM

## 2022-08-15 DIAGNOSIS — M25552 Pain in left hip: Secondary | ICD-10-CM | POA: Diagnosis not present

## 2022-08-22 ENCOUNTER — Other Ambulatory Visit: Payer: Self-pay | Admitting: Physician Assistant

## 2022-08-22 DIAGNOSIS — E669 Obesity, unspecified: Secondary | ICD-10-CM

## 2022-08-22 DIAGNOSIS — F339 Major depressive disorder, recurrent, unspecified: Secondary | ICD-10-CM

## 2022-09-13 ENCOUNTER — Telehealth: Payer: Self-pay

## 2022-09-13 ENCOUNTER — Other Ambulatory Visit: Payer: Self-pay

## 2022-09-13 DIAGNOSIS — F339 Major depressive disorder, recurrent, unspecified: Secondary | ICD-10-CM

## 2022-09-13 DIAGNOSIS — J3089 Other allergic rhinitis: Secondary | ICD-10-CM

## 2022-09-13 MED ORDER — FLUTICASONE PROPIONATE 50 MCG/ACT NA SUSP: NASAL | 0 refills | Status: DC

## 2022-09-13 MED ORDER — DULOXETINE HCL 30 MG PO CPEP: 90.0000 mg | ORAL_CAPSULE | Freq: Every day | ORAL | 0 refills | Status: DC

## 2022-09-13 NOTE — Telephone Encounter (Signed)
Refills sent and patient is aware 

## 2022-09-13 NOTE — Telephone Encounter (Signed)
Patient called office requesting medical refill on her DULoxetine, and her flonase, patient is aware that she is overdue for her physical and would like to see if she could get a refill, patient is scheduled for physical on 11/6, thanks!

## 2022-09-14 DIAGNOSIS — M25552 Pain in left hip: Secondary | ICD-10-CM | POA: Diagnosis not present

## 2022-10-02 ENCOUNTER — Encounter: Payer: Self-pay | Admitting: Physician Assistant

## 2022-10-02 ENCOUNTER — Ambulatory Visit (INDEPENDENT_AMBULATORY_CARE_PROVIDER_SITE_OTHER): Payer: Federal, State, Local not specified - PPO | Admitting: Physician Assistant

## 2022-10-02 VITALS — BP 111/80 | HR 88 | Resp 18 | Ht 68.0 in | Wt 182.0 lb

## 2022-10-02 DIAGNOSIS — B379 Candidiasis, unspecified: Secondary | ICD-10-CM | POA: Diagnosis not present

## 2022-10-02 DIAGNOSIS — Z23 Encounter for immunization: Secondary | ICD-10-CM

## 2022-10-02 DIAGNOSIS — F339 Major depressive disorder, recurrent, unspecified: Secondary | ICD-10-CM | POA: Diagnosis not present

## 2022-10-02 DIAGNOSIS — Z0000 Encounter for general adult medical examination without abnormal findings: Secondary | ICD-10-CM | POA: Diagnosis not present

## 2022-10-02 DIAGNOSIS — E559 Vitamin D deficiency, unspecified: Secondary | ICD-10-CM

## 2022-10-02 MED ORDER — FLUCONAZOLE 150 MG PO TABS: 150.0000 mg | ORAL_TABLET | Freq: Once | ORAL | 2 refills | Status: AC

## 2022-10-02 MED ORDER — DULOXETINE HCL 60 MG PO CPEP: 120.0000 mg | ORAL_CAPSULE | Freq: Every day | ORAL | 1 refills | Status: DC

## 2022-10-02 NOTE — Progress Notes (Signed)
Complete physical exam   Patient: Christine Frank   DOB: 09/11/1966   56 y.o. Female  MRN: [id number] Visit Date: 10/02/2022   Chief Complaint  Patient presents with   Annual Exam   Subjective    Christine Frank is a 56 y.o. female who presents today for a complete physical exam.  She reports consuming a  high fiber and low fat  diet.  Low impact exercise 4 times per week for 40-45 minutes.  She generally feels fairly well. She does have additional problems to discuss today- wants to try duloxetine 120 mg, feels like 90 mg is not as effective as before. Has been more motivated and doing more things she likes, decorated her house this year for Halloween which she hasn't done in many years.     Past Medical History:  Diagnosis Date   Allergy    Anemia    Chronic hip pain    Chronic leg pain    bilateral   Depression    Migraine    PMDD (premenstrual dysphoric disorder)    Restless leg    S/P epidural steroid injection    affects legs is seen at pain clinic.   Past Surgical History:  Procedure Laterality Date   CERVICAL CERCLAGE     CESAREAN SECTION     x 2   CESAREAN SECTION  1994   CESAREAN SECTION  1999   KNEE SURGERY Right 11/27/2019   torn meniscus and torn cartilage repaired   Social History   Socioeconomic History   Marital status: Widowed    Spouse name: Not on file   Number of children: 2   Years of education: Not on file   Highest education level: Bachelor's degree (e.g., BA, AB, BS)  Occupational History   Not on file  Tobacco Use   Smoking status: Never   Smokeless tobacco: Never   Tobacco comments:    tried tobacco a few times as a teen, never picked up the habit  Vaping Use   Vaping Use: Never used  Substance and Sexual Activity   Alcohol use: Yes    Alcohol/week: 0.0 standard drinks of alcohol    Comment: Rare; 2-3 times per year socially   Drug use: No   Sexual activity: Yes    Birth control/protection: None    Comment: 1st  intercourse 40 yo-5 partners  Other Topics Concern   Not on file  Social History Narrative   Lives at home with her 2 sons. Has 6 children total, 4 from previous marriage. Husband passed away 1 year ago.   Right handed   Daily less than one cup of coffee & three 8-12 oz cups of decaffeinated soda daily   Social Determinants of Health   Financial Resource Strain: Not on file  Food Insecurity: Not on file  Transportation Needs: Not on file  Physical Activity: Not on file  Stress: Not on file  Social Connections: Not on file  Intimate Partner Violence: Not on file     Medications: Outpatient Medications Prior to Visit  Medication Sig   baclofen (LIORESAL) 10 MG tablet Take 1 tablet (10 mg total) by mouth 2 (two) times daily. Take 1/2 to 1 tablet 2 x day if needed for muscle spasm   BIOTIN PO 10,000 mcg oral once daily   CRANBERRY PO Take by mouth daily. With vitamin C   diclofenac sodium (VOLTAREN) 1 % GEL Apply 1 application topically 3 (three) times daily as needed.  docusate sodium (COLACE) 100 MG capsule Take 100 mg by mouth 2 (two) times daily.   fluticasone (FLONASE) 50 MCG/ACT nasal spray INSTILL 1 SPRAY INTO BOTH NOSTRILS DAILY   gabapentin (NEURONTIN) 300 MG capsule TAKE 1 CAPSULE BY MOUTH THREE TIMES A DAY   HYDROcodone-acetaminophen (NORCO) 10-325 MG tablet Take 1 tablet by mouth 4 (four) times daily.    ibuprofen (ADVIL) 600 MG tablet Take 1 tablet (600 mg total) by mouth every 6 (six) hours as needed.   MELATONIN PO Take by mouth.   morphine (MS CONTIN) 30 MG 12 hr tablet Take 1 tablet by mouth 2 (two) times daily.   Multiple Vitamin (MULTIVITAMIN) capsule Take 1 capsule by mouth daily.   promethazine (PHENERGAN) 25 MG tablet Take 1 tablet (25 mg total) by mouth every 6 (six) hours as needed for nausea or vomiting.   rizatriptan (MAXALT) 10 MG tablet May repeat in 2 hours if needed   SUMAtriptan Succinate (ZEMBRACE SYMTOUCH) 3 MG/0.5ML SOAJ Inject 3 mg into the skin  every 1 hour as needed for migraine. Max 4 injections in one day.   VITAMIN D PO Take by mouth.   [DISCONTINUED] DULoxetine (CYMBALTA) 30 MG capsule Take 3 capsules (90 mg total) by mouth daily.   Cetirizine HCl 10 MG CAPS Take 1 capsule (10 mg total) by mouth daily for 10 days.   Semaglutide, 1 MG/DOSE, (OZEMPIC, 1 MG/DOSE,) 4 MG/3ML SOPN INJECT 1 MG SUBCUTANEOUSLY ONCE WEEKLY (Patient not taking: Reported on 10/02/2022)   Facility-Administered Medications Prior to Visit  Medication Dose Route Frequency Provider   0.9 %  sodium chloride infusion  500 mL Intravenous Continuous Nandigam, Kavitha V, MD    Review of Systems Review of Systems:  A fourteen system review of systems was performed and found to be positive as per HPI.  Last CBC Lab Results  Component Value Date   WBC 7.7 09/06/2020   HGB 12.0 09/06/2020   HCT 36.0 09/06/2020   MCV 89 09/06/2020   MCH 29.6 09/06/2020   RDW 13.7 09/06/2020   PLT 406 16/08/9603   Last metabolic panel Lab Results  Component Value Date   GLUCOSE 103 (H) 09/06/2020   NA 140 09/06/2020   K 4.0 09/06/2020   CL 100 09/06/2020   CO2 26 09/06/2020   BUN 8 09/06/2020   CREATININE 0.72 09/06/2020   GFRNONAA 95 09/06/2020   CALCIUM 9.7 09/06/2020   PROT 6.9 09/06/2020   ALBUMIN 4.2 09/06/2020   LABGLOB 2.7 09/06/2020   AGRATIO 1.6 09/06/2020   BILITOT <0.2 09/06/2020   ALKPHOS 92 09/06/2020   AST 18 09/06/2020   ALT 15 09/06/2020   ANIONGAP 18 (H) 06/16/2018   Last lipids Lab Results  Component Value Date   CHOL 191 09/06/2020   HDL 55 09/06/2020   LDLCALC 93 09/06/2020   TRIG 256 (H) 09/06/2020   CHOLHDL 3.5 09/06/2020   Last hemoglobin A1c Lab Results  Component Value Date   HGBA1C 5.5 09/06/2020   Last thyroid functions Lab Results  Component Value Date   TSH 1.710 09/06/2020   Last vitamin D Lab Results  Component Value Date   VD25OH 32.0 09/06/2020      Objective    BP 111/80 (BP Location: Left Arm, Patient  Position: Sitting, Cuff Size: Normal)   Pulse 88   Resp 18   Ht _0  (1.727 m)   Wt 182 lb (82.6 kg)   SpO2 97%   BMI 27.67 kg/m  BP Readings  from Last 3 Encounters:  10/02/22 111/80  02/21/22 108/71  01/10/22 123/84   Wt Readings from Last 3 Encounters:  10/02/22 182 lb (82.6 kg)  02/21/22 185 lb (83.9 kg)  01/10/22 186 lb (84.4 kg)    Physical Exam   General Appearance:       Alert, cooperative, in no acute distress, appears stated age   Head:    Normocephalic, without obvious abnormality, atraumatic  Eyes:    PERRL, conjunctiva/corneas clear, EOM's intact, fundi    benign, both eyes  Ears:    Normal TM's and external ear canals, both ears  Nose:   Nares normal, septum midline, mucosa normal, no drainage    or sinus tenderness  Throat:   Lips, mucosa, and tongue normal; teeth and gums normal  Neck:   Supple, symmetrical, trachea midline, no adenopathy;    thyroid:  no enlargement/tenderness/nodules; no JVD  Back:     Symmetric, no curvature, ROM normal, no CVA tenderness  Lungs:     Clear to auscultation bilaterally, respirations unlabored  Chest Wall:    No tenderness or deformity   Heart:    Normal heart rate. Normal rhythm. No murmurs, rubs, or gallops.   Breast Exam:    deferred  Abdomen:     Soft, non-tender, bowel sounds active all four quadrants,    no masses, no organomegaly  Pelvic:    deferred  Extremities:   All extremities are intact. No cyanosis or edema  Pulses:   2+ and symmetric all extremities  Skin:   Skin color, texture, turgor normal, no suspicious rashes or lesions  Lymph nodes:   Cervical, supraclavicular nodes normal  Neurologic:   CNII-XII grossly intact.     Last depression screening scores    10/02/2022    2:24 PM 02/21/2022    2:57 PM 01/10/2022    3:03 PM  PHQ 2/9 Scores  PHQ - 2 Score _0 PHQ- 9 Score _1 Last fall risk screening    10/02/2022    2:24 PM  [place] in the past year? 0  Number falls in past yr:  0  Injury with Fall? 0     No results found for any visits on 10/02/22.  Assessment & Plan    Routine Health Maintenance and Physical Exam  Exercise Activities and Dietary recommendations -Discussed heart healthy diet low in fat and carbohydrates. Recommend moderate exercise 150 mins/wk.  Immunization History  Administered Date(s) Administered   Influenza, Seasonal, Injecte, Preservative Fre 10/14/2015   Influenza,inj,Quad PF,6+ Mos 12/07/2016, 09/10/2019, 09/13/2020, 10/02/2022   Influenza,inj,quad, With Preservative 07/31/2019   MMR 01/02/2005   Moderna Sars-Covid-2 Vaccination 04/21/2020, 06/02/2020   Tdap 12/07/2016   Zoster Recombinat (Shingrix) 09/13/2020, 10/02/2022    Health Maintenance  Topic Date Due   HIV Screening  Never done   Hepatitis C Screening  Never done   COVID-19 Vaccine (3 - Moderna series) 07/28/2020   COLONOSCOPY (Pts 45-45yr Insurance coverage will need to be confirmed)  02/21/2022   PAP SMEAR-Modifier  09/09/2022   MAMMOGRAM  05/14/2023   TETANUS/TDAP  12/07/2026   INFLUENZA VACCINE  Completed   Zoster Vaccines- Shingrix  Completed   HPV VACCINES  Aged Out    Discussed health benefits of physical activity, and encouraged her to engage in regular exercise appropriate for her age and condition.  Problem List Items Addressed This Visit       Other   Healthcare maintenance -  Primary   Relevant Orders   Hemoglobin A1c   Lipid panel   TSH   CBC with Differential/Platelet   Comprehensive metabolic panel   Vitamin D (25 hydroxy)   Vitamin D deficiency   Relevant Orders   Vitamin D (25 hydroxy)   Depression   Relevant Medications   DULoxetine (CYMBALTA) 60 MG capsule   Other Visit Diagnoses     Yeast infection       Relevant Medications   fluconazole (DIFLUCAN) 150 MG tablet   Need for influenza vaccination       Relevant Orders   Flu Vaccine QUAD 6+ mos PF IM (Fluarix Quad PF) (Completed)   Need for zoster vaccination        Relevant Orders   Zoster Recombinant (Shingrix ) (Completed)      Advised to schedule lab visit for routine fasting labs. Pt agreeable to flu vaccine and second dose of Shingrix. Will increase duloxetine to 120 mg daily, provided new rx.  UTD pap- last pap 09/10/2019 neg HPV and malignancy so repeat in 5 yrs. Pt plans to reach out to gastro for screening colonoscopy. Planning to schedule annual mammogram. Pt has been without Ozempic 1 mg and has been able to maintain most of her weight off with diet and exercise. Will await A1c results to see if medication needs to be re-started, otherwise recommend to discontinue. Recommend to follow-up in 4 months for reg OV- Mood- inc med, Wt   Return in about 4 months (around 01/31/2023) for Mood, Wt; lab visit this week for FBW.       Lorrene Reid, PA-C  [facility] [phone] (phone) [phone] (fax)  [place]

## 2022-10-02 NOTE — Patient Instructions (Signed)
Preventive Care 40-56 Years Old, Female Preventive care refers to lifestyle choices and visits with your health care provider that can promote health and wellness. Preventive care visits are also called wellness exams. What can I expect for my preventive care visit? Counseling Your health care provider may ask you questions about your: Medical history, including: Past medical problems. Family medical history. Pregnancy history. Current health, including: Menstrual cycle. Method of birth control. Emotional well-being. Home life and relationship well-being. Sexual activity and sexual health. Lifestyle, including: Alcohol, nicotine or tobacco, and drug use. Access to firearms. Diet, exercise, and sleep habits. Work and work environment. Sunscreen use. Safety issues such as seatbelt and bike helmet use. Physical exam Your health care provider will check your: Height and weight. These may be used to calculate your BMI (body mass index). BMI is a measurement that tells if you are at a healthy weight. Waist circumference. This measures the distance around your waistline. This measurement also tells if you are at a healthy weight and may help predict your risk of certain diseases, such as type 2 diabetes and high blood pressure. Heart rate and blood pressure. Body temperature. Skin for abnormal spots. What immunizations do I need?  Vaccines are usually given at various ages, according to a schedule. Your health care provider will recommend vaccines for you based on your age, medical history, and lifestyle or other factors, such as travel or where you work. What tests do I need? Screening Your health care provider may recommend screening tests for certain conditions. This may include: Lipid and cholesterol levels. Diabetes screening. This is done by checking your blood sugar (glucose) after you have not eaten for a while (fasting). Pelvic exam and Pap test. Hepatitis B test. Hepatitis C  test. HIV (human immunodeficiency virus) test. STI (sexually transmitted infection) testing, if you are at risk. Lung cancer screening. Colorectal cancer screening. Mammogram. Talk with your health care provider about when you should start having regular mammograms. This may depend on whether you have a family history of breast cancer. BRCA-related cancer screening. This may be done if you have a family history of breast, ovarian, tubal, or peritoneal cancers. Bone density scan. This is done to screen for osteoporosis. Talk with your health care provider about your test results, treatment options, and if necessary, the need for more tests. Follow these instructions at home: Eating and drinking  Eat a diet that includes fresh fruits and vegetables, whole grains, lean protein, and low-fat dairy products. Take vitamin and mineral supplements as recommended by your health care provider. Do not drink alcohol if: Your health care provider tells you not to drink. You are pregnant, may be pregnant, or are planning to become pregnant. If you drink alcohol: Limit how much you have to 0-1 drink a day. Know how much alcohol is in your drink. In the [place] one drink equals one 12 oz bottle of beer (355 mL), one 5 oz glass of wine (148 mL), or one 1 oz glass of hard liquor (44 mL). Lifestyle Brush your teeth every morning and night with fluoride toothpaste. Floss one time each day. Exercise for at least 30 minutes 5 or more days each week. Do not use any products that contain nicotine or tobacco. These products include cigarettes, chewing tobacco, and vaping devices, such as e-cigarettes. If you need help quitting, ask your health care provider. Do not use drugs. If you are sexually active, practice safe sex. Use a condom or other form of protection to   prevent STIs. If you do not wish to become pregnant, use a form of birth control. If you plan to become pregnant, see your health care provider for a  prepregnancy visit. Take aspirin only as told by your health care provider. Make sure that you understand how much to take and what form to take. Work with your health care provider to find out whether it is safe and beneficial for you to take aspirin daily. Find healthy ways to manage stress, such as: Meditation, yoga, or listening to music. Journaling. Talking to a trusted person. Spending time with friends and family. Minimize exposure to UV radiation to reduce your risk of skin cancer. Safety Always wear your seat belt while driving or riding in a vehicle. Do not drive: If you have been drinking alcohol. Do not ride with someone who has been drinking. When you are tired or distracted. While texting. If you have been using any mind-altering substances or drugs. Wear a helmet and other protective equipment during sports activities. If you have firearms in your house, make sure you follow all gun safety procedures. Seek help if you have been physically or sexually abused. What's next? Visit your health care provider once a year for an annual wellness visit. Ask your health care provider how often you should have your eyes and teeth checked. Stay up to date on all vaccines. This information is not intended to replace advice given to you by your health care provider. Make sure you discuss any questions you have with your health care provider. Document Revised: 05/11/2021 Document Reviewed: 05/11/2021 Elsevier Patient Education  [place].

## 2022-10-09 ENCOUNTER — Other Ambulatory Visit: Payer: Federal, State, Local not specified - PPO

## 2022-10-09 DIAGNOSIS — E559 Vitamin D deficiency, unspecified: Secondary | ICD-10-CM

## 2022-10-09 DIAGNOSIS — Z0000 Encounter for general adult medical examination without abnormal findings: Secondary | ICD-10-CM | POA: Diagnosis not present

## 2022-10-10 LAB — COMPREHENSIVE METABOLIC PANEL
ALT: 17 IU/L (ref 0–32)
AST: 18 IU/L (ref 0–40)
Albumin/Globulin Ratio: 1.6 (ref 1.2–2.2)
Albumin: 4.3 g/dL (ref 3.8–4.9)
Alkaline Phosphatase: 86 IU/L (ref 44–121)
BUN/Creatinine Ratio: 16 (ref 9–23)
BUN: 12 mg/dL (ref 6–24)
Bilirubin Total: <0.2 mg/dL (ref 0.0–1.2)
CO2: 24 mmol/L (ref 20–29)
Calcium: 9.3 mg/dL (ref 8.7–10.2)
Chloride: 103 mmol/L (ref 96–106)
Creatinine, Ser: 0.77 mg/dL (ref 0.57–1.00)
Globulin, Total: 2.7 g/dL (ref 1.5–4.5)
Glucose: 106 mg/dL — ABNORMAL HIGH (ref 70–99)
Potassium: 4.4 mmol/L (ref 3.5–5.2)
Sodium: 141 mmol/L (ref 134–144)
Total Protein: 7 g/dL (ref 6.0–8.5)
eGFR: 90 mL/min/{1.73_m2} (ref >59)

## 2022-10-10 LAB — CBC WITH DIFFERENTIAL/PLATELET
Basophils Absolute: 0.1 10*3/uL (ref 0.0–0.2)
Basos: 1 %
EOS (ABSOLUTE): 0.3 10*3/uL (ref 0.0–0.4)
Eos: 4 %
Hematocrit: 36.3 % (ref 34.0–46.6)
Hemoglobin: 12.4 g/dL (ref 11.1–15.9)
Immature Grans (Abs): 0 10*3/uL (ref 0.0–0.1)
Immature Granulocytes: 0 %
Lymphocytes Absolute: 2.3 10*3/uL (ref 0.7–3.1)
Lymphs: 30 %
MCH: 29.8 pg (ref 26.6–33.0)
MCHC: 34.2 g/dL (ref 31.5–35.7)
MCV: 87 fL (ref 79–97)
Monocytes Absolute: 0.8 10*3/uL (ref 0.1–0.9)
Monocytes: 10 %
Neutrophils Absolute: 4.4 10*3/uL (ref 1.4–7.0)
Neutrophils: 55 %
Platelets: 438 10*3/uL (ref 150–450)
RBC: 4.16 x10E6/uL (ref 3.77–5.28)
RDW: 11.8 % (ref 11.7–15.4)
WBC: 7.8 10*3/uL (ref 3.4–10.8)

## 2022-10-10 LAB — LIPID PANEL
Chol/HDL Ratio: 3.2 ratio (ref 0.0–4.4)
Cholesterol, Total: 180 mg/dL (ref 100–199)
HDL: 57 mg/dL (ref >39)
LDL Chol Calc (NIH): 97 mg/dL (ref 0–99)
Triglycerides: 150 mg/dL — ABNORMAL HIGH (ref 0–149)
VLDL Cholesterol Cal: 26 mg/dL (ref 5–40)

## 2022-10-10 LAB — HEMOGLOBIN A1C
Est. average glucose Bld gHb Est-mCnc: 111 mg/dL
Hgb A1c MFr Bld: 5.5 % (ref 4.8–5.6)

## 2022-10-10 LAB — VITAMIN D 25 HYDROXY (VIT D DEFICIENCY, FRACTURES): Vit D, 25-Hydroxy: 69.6 ng/mL (ref 30.0–100.0)

## 2022-10-10 LAB — TSH: TSH: 0.841 u[IU]/mL (ref 0.450–4.500)

## 2022-10-11 DIAGNOSIS — M25552 Pain in left hip: Secondary | ICD-10-CM | POA: Diagnosis not present

## 2022-11-13 ENCOUNTER — Other Ambulatory Visit: Payer: Self-pay | Admitting: Neurology

## 2022-11-13 DIAGNOSIS — M25552 Pain in left hip: Secondary | ICD-10-CM | POA: Diagnosis not present

## 2022-11-13 DIAGNOSIS — R51 Headache with orthostatic component, not elsewhere classified: Secondary | ICD-10-CM

## 2022-12-14 DIAGNOSIS — M25552 Pain in left hip: Secondary | ICD-10-CM | POA: Diagnosis not present

## 2022-12-14 DIAGNOSIS — Z79891 Long term (current) use of opiate analgesic: Secondary | ICD-10-CM | POA: Diagnosis not present

## 2022-12-15 ENCOUNTER — Other Ambulatory Visit: Payer: Self-pay | Admitting: Neurology

## 2022-12-15 DIAGNOSIS — R51 Headache with orthostatic component, not elsewhere classified: Secondary | ICD-10-CM

## 2022-12-25 ENCOUNTER — Other Ambulatory Visit: Payer: Self-pay | Admitting: Neurology

## 2022-12-25 DIAGNOSIS — R51 Headache with orthostatic component, not elsewhere classified: Secondary | ICD-10-CM

## 2023-01-05 ENCOUNTER — Other Ambulatory Visit: Payer: Self-pay | Admitting: Neurology

## 2023-01-09 ENCOUNTER — Other Ambulatory Visit: Payer: Self-pay

## 2023-01-09 DIAGNOSIS — J3089 Other allergic rhinitis: Secondary | ICD-10-CM

## 2023-01-09 MED ORDER — FLUTICASONE PROPIONATE 50 MCG/ACT NA SUSP: NASAL | 0 refills | Status: DC

## 2023-01-09 NOTE — Telephone Encounter (Signed)
L.O.V: 10/02/22  N.O.V: 02/06/23  L.R.F: 09/13/22 Fluticasone 9.11m 0 refill

## 2023-01-13 ENCOUNTER — Other Ambulatory Visit: Payer: Self-pay | Admitting: Neurology

## 2023-01-13 DIAGNOSIS — R51 Headache with orthostatic component, not elsewhere classified: Secondary | ICD-10-CM

## 2023-01-15 DIAGNOSIS — M7061 Trochanteric bursitis, right hip: Secondary | ICD-10-CM | POA: Diagnosis not present

## 2023-01-29 DIAGNOSIS — M7061 Trochanteric bursitis, right hip: Secondary | ICD-10-CM | POA: Diagnosis not present

## 2023-02-01 ENCOUNTER — Telehealth: Payer: Self-pay | Admitting: Neurology

## 2023-02-01 NOTE — Telephone Encounter (Signed)
Jillian/angel: I do no tbelieve we have seen patient since 2023. Tell patient we cannot refill her meds unless she makes a new appointment or she can ask her primary care to refill gabapentin and rizatriptan and she doesn't have to see [place] thanks  Set up with NP(not physician) please if she likes and let me know who she is seeing so I can give NP some info thanks  Pod 4 fyi

## 2023-02-01 NOTE — Telephone Encounter (Signed)
LVM relaying below info and asking pt to call us back if she would like to make an appointment with NP.

## 2023-02-05 ENCOUNTER — Ambulatory Visit: Payer: Federal, State, Local not specified - PPO | Admitting: Nurse Practitioner

## 2023-02-06 ENCOUNTER — Encounter: Payer: Self-pay | Admitting: Nurse Practitioner

## 2023-02-06 ENCOUNTER — Ambulatory Visit: Payer: Federal, State, Local not specified - PPO | Admitting: Nurse Practitioner

## 2023-02-06 VITALS — BP 107/74 | HR 86 | Ht 68.0 in | Wt 190.0 lb

## 2023-02-06 DIAGNOSIS — F339 Major depressive disorder, recurrent, unspecified: Secondary | ICD-10-CM | POA: Diagnosis not present

## 2023-02-06 DIAGNOSIS — G43709 Chronic migraine without aura, not intractable, without status migrainosus: Secondary | ICD-10-CM

## 2023-02-06 DIAGNOSIS — M5440 Lumbago with sciatica, unspecified side: Secondary | ICD-10-CM

## 2023-02-06 MED ORDER — BUPROPION HCL ER (SR) 100 MG PO TB12: 100.0000 mg | ORAL_TABLET | Freq: Every day | ORAL | 1 refills | Status: DC

## 2023-02-06 MED ORDER — DULOXETINE HCL 60 MG PO CPEP: 60.0000 mg | ORAL_CAPSULE | Freq: Every day | ORAL | 1 refills | Status: DC

## 2023-02-06 NOTE — Progress Notes (Signed)
Established patient visit   Patient: Christine Frank   DOB: 03-01-1966   57 y.o. Female  MRN: [id number] Visit Date: 02/06/2023   Chief Complaint  Patient presents with   Medical Management of Chronic Issues   Subjective    HPI  Follow up  -takes duloxetine for depression and pain --last visit here, was increased to 120 mg daily  --was initially put on this medication for pain management. States that she never really noted an improvement in pain.  --has not noted any improvement with increase in dosing to 120 mg of duloxetine.  --in fact, she feels as though symptoms have worsened since the increase in dose of duloxetine.  --feels very fatigue. States that if she could, she would sleep all day.  -lacks motivation and interest in activities she would generally find enjoyable.  -denies feeling suicidal     Row Labels 02/06/2023    2:19 PM 10/02/2022    2:24 PM 02/21/2022    2:57 PM  Depression screen PHQ 2/9   Section Header. No data exists in this row.     Decreased Interest   3 0 1  Down, Depressed, Hopeless   2 1 0  PHQ - 2 Score   Altered sleeping   2 0 1  Tired, decreased energy   Change in appetite   2 0 1  Feeling bad or failure about yourself    3 1 0  Trouble concentrating   2 1 0  Moving slowly or fidgety/restless   0 0 0  Suicidal thoughts   0 0 0  PHQ-9 Score   Difficult doing work/chores    Somewhat difficult Somewhat difficult    -had been off ozempic. Will see how she is managing with diet and exercise. Most recent HgbA1c was 5.5.  -insurance will not cover ozempic if not diabetic.  -she will check with insurance to see if they will cover alternative medications for weight loss -she has regained 8 pounds since her last visit here.  -mammogram  -?screening colonoscopy. Last one done 2018. Repeat to be done in 5 - 10 years  -patient is concerned about Cushing's disease  -has been getting steroid injections into her hip for some time.   -has started flushing in the face.  -moon facies -weight gain -fatigue.   Medications: Outpatient Medications Prior to Visit  Medication Sig   baclofen (LIORESAL) 10 MG tablet Take 1 tablet (10 mg total) by mouth 2 (two) times daily. Take 1/2 to 1 tablet 2 x day if needed for muscle spasm   BIOTIN PO 10,000 mcg oral once daily   CRANBERRY PO Take by mouth daily. With vitamin C   diclofenac sodium (VOLTAREN) 1 % GEL Apply 1 application topically 3 (three) times daily as needed.   docusate sodium (COLACE) 100 MG capsule Take 100 mg by mouth 2 (two) times daily.   fluticasone (FLONASE) 50 MCG/ACT nasal spray INSTILL 1 SPRAY INTO BOTH NOSTRILS DAILY   gabapentin (NEURONTIN) 300 MG capsule TAKE 1 CAPSULE BY MOUTH THREE TIMES DAILY   HYDROcodone-acetaminophen (NORCO) 10-325 MG tablet Take 1 tablet by mouth 4 (four) times daily.    ibuprofen (ADVIL) 600 MG tablet Take 1 tablet (600 mg total) by mouth every 6 (six) hours as needed.   MELATONIN PO Take by mouth.   morphine (MS CONTIN) 30 MG 12 hr tablet Take 1 tablet by mouth 2 (two) times  daily.   Multiple Vitamin (MULTIVITAMIN) capsule Take 1 capsule by mouth daily.   promethazine (PHENERGAN) 25 MG tablet TAKE 1 TABLET BY MOUTH EVERY 6 HOURS AS NEEDED FOR NAUSEA OR VOMITING   rizatriptan (MAXALT) 10 MG tablet TAKE 1 TABLET BY MOUTH DAILY (MAY REPEAT IN TWO HOURS IF NEEDED. MAX TWO PER DAY)   SUMAtriptan Succinate (ZEMBRACE SYMTOUCH) 3 MG/0.5ML SOAJ Inject 3 mg into the skin every 1 hour as needed for migraine. Max 4 injections in one day.   VITAMIN D PO Take by mouth.   [DISCONTINUED] DULoxetine (CYMBALTA) 60 MG capsule Take 2 capsules (120 mg total) by mouth daily.   Cetirizine HCl 10 MG CAPS Take 1 capsule (10 mg total) by mouth daily for 10 days.   Semaglutide, 1 MG/DOSE, (OZEMPIC, 1 MG/DOSE,) 4 MG/3ML SOPN INJECT 1 MG SUBCUTANEOUSLY ONCE WEEKLY (Patient not taking: Reported on 10/02/2022)   Facility-Administered Medications Prior to Visit   Medication Dose Route Frequency Provider   0.9 %  sodium chloride infusion  500 mL Intravenous Continuous Marsa Aris V, MD    Review of Systems See HPI    Last CBC Lab Results  Component Value Date   WBC 6.2 03/07/2023   HGB 12.2 03/07/2023   HCT 35.2 03/07/2023   MCV 89 03/07/2023   MCH 30.8 03/07/2023   RDW 12.8 03/07/2023   PLT 436 03/07/2023   Last metabolic panel Lab Results  Component Value Date   GLUCOSE 100 (H) 03/07/2023   NA 140 03/07/2023   K 4.4 03/07/2023   CL 102 03/07/2023   CO2 24 03/07/2023   BUN 17 03/07/2023   CREATININE 0.79 03/07/2023   EGFR 87 03/07/2023   CALCIUM 9.2 03/07/2023   PROT 6.9 03/07/2023   ALBUMIN 4.2 03/07/2023   LABGLOB 2.7 03/07/2023   AGRATIO 1.6 03/07/2023   BILITOT 0.4 03/07/2023   ALKPHOS 91 03/07/2023   AST 15 03/07/2023   ALT 12 03/07/2023   ANIONGAP 18 (H) 06/16/2018   Last lipids Lab Results  Component Value Date   CHOL 193 03/07/2023   HDL 60 03/07/2023   LDLCALC 110 (H) 03/07/2023   TRIG 131 03/07/2023   CHOLHDL 3.2 03/07/2023   Last hemoglobin A1c Lab Results  Component Value Date   HGBA1C 5.6 03/07/2023   Last thyroid functions Lab Results  Component Value Date   TSH 1.480 03/07/2023   Last vitamin D Lab Results  Component Value Date   VD25OH 69.6 10/09/2022   Last vitamin B12 and Folate Lab Results  Component Value Date   VITAMINB12 654 04/22/2015       Objective     Today's Vitals   02/06/23 1418  BP: 107/74  Pulse: 86  SpO2: 94%  Weight: 190 lb (86.2 kg)  Height: 5\' 8"  (1.727 m)   Body mass index is 28.89 kg/m.  BP Readings from Last 3 Encounters:  02/06/23 107/74  10/02/22 111/80  02/21/22 108/71    Wt Readings from Last 3 Encounters:  02/06/23 190 lb (86.2 kg)  10/02/22 182 lb (82.6 kg)  02/21/22 185 lb (83.9 kg)    Physical Exam Vitals and nursing note reviewed.  Constitutional:      Appearance: Normal appearance. She is well-developed.  HENT:     Head:  Normocephalic and atraumatic.     Nose: Nose normal.     Mouth/Throat:     Mouth: Mucous membranes are moist.     Pharynx: Oropharynx is clear.  Eyes:  Extraocular Movements: Extraocular movements intact.     Conjunctiva/sclera: Conjunctivae normal.     Pupils: Pupils are equal, round, and reactive to light.  Neck:     Vascular: No carotid bruit.  Cardiovascular:     Rate and Rhythm: Normal rate and regular rhythm.     Pulses: Normal pulses.     Heart sounds: Normal heart sounds.  Pulmonary:     Effort: Pulmonary effort is normal.     Breath sounds: Normal breath sounds.  Abdominal:     Palpations: Abdomen is soft.  Musculoskeletal:        General: Normal range of motion.     Cervical back: Normal range of motion and neck supple.  Lymphadenopathy:     Cervical: No cervical adenopathy.  Skin:    General: Skin is warm and dry.     Capillary Refill: Capillary refill takes less than 2 seconds.  Neurological:     General: No focal deficit present.     Mental Status: She is alert and oriented to person, place, and time.  Psychiatric:        Attention and Perception: Attention and perception normal.        Mood and Affect: Mood is anxious and depressed.        Speech: Speech normal.        Behavior: Behavior normal.        Thought Content: Thought content normal.        Cognition and Memory: Cognition and memory normal.        Judgment: Judgment normal.       Assessment & Plan     Problem List Items Addressed This Visit       Other   Depression   Relevant Medications   DULoxetine (CYMBALTA) 60 MG capsule   buPROPion ER (WELLBUTRIN SR) 100 MG 12 hr tablet     Return in about 6 weeks (around 03/20/2023) for mood. labs in next few days to check Cushing's disease .         Carlean Jews, NP  [facility] at [facility] [phone] (phone) [phone] (fax)  [facility]

## 2023-02-09 ENCOUNTER — Other Ambulatory Visit: Payer: Federal, State, Local not specified - PPO

## 2023-02-26 ENCOUNTER — Other Ambulatory Visit: Payer: Self-pay | Admitting: Nurse Practitioner

## 2023-02-26 DIAGNOSIS — Z1231 Encounter for screening mammogram for malignant neoplasm of breast: Secondary | ICD-10-CM

## 2023-02-26 NOTE — Telephone Encounter (Signed)
Pt called. Requesting an appointment with Dr. Jaynee Eagles. I informed pt I could scheduled her appointment with NP. Pt stated that's fine, she was scheduled with Sarah on 8/7 @ 3:00 pm.

## 2023-03-06 ENCOUNTER — Other Ambulatory Visit: Payer: Federal, State, Local not specified - PPO

## 2023-03-07 ENCOUNTER — Other Ambulatory Visit: Payer: Federal, State, Local not specified - PPO

## 2023-03-07 ENCOUNTER — Other Ambulatory Visit: Payer: Self-pay

## 2023-03-07 DIAGNOSIS — Z0000 Encounter for general adult medical examination without abnormal findings: Secondary | ICD-10-CM | POA: Diagnosis not present

## 2023-03-07 DIAGNOSIS — Z130 Encounter for screening for diseases of the blood and blood-forming organs and certain disorders involving the immune mechanism: Secondary | ICD-10-CM

## 2023-03-07 DIAGNOSIS — Z13228 Encounter for screening for other metabolic disorders: Secondary | ICD-10-CM | POA: Diagnosis not present

## 2023-03-07 DIAGNOSIS — Z1329 Encounter for screening for other suspected endocrine disorder: Secondary | ICD-10-CM | POA: Diagnosis not present

## 2023-03-08 LAB — COMPREHENSIVE METABOLIC PANEL
Alkaline Phosphatase: 91 IU/L (ref 44–121)
Bilirubin Total: 0.4 mg/dL (ref 0.0–1.2)
Calcium: 9.2 mg/dL (ref 8.7–10.2)
Creatinine, Ser: 0.79 mg/dL (ref 0.57–1.00)
Sodium: 140 mmol/L (ref 134–144)

## 2023-03-08 LAB — CORTISOL, ACTH STIMULATION

## 2023-03-08 LAB — CBC
Hematocrit: 35.2 % (ref 34.0–46.6)
MCH: 30.8 pg (ref 26.6–33.0)
RBC: 3.96 x10E6/uL (ref 3.77–5.28)
WBC: 6.2 10*3/uL (ref 3.4–10.8)

## 2023-03-08 LAB — TSH: TSH: 1.48 u[IU]/mL (ref 0.450–4.500)

## 2023-03-08 LAB — LIPID PANEL: Cholesterol, Total: 193 mg/dL (ref 100–199)

## 2023-03-08 NOTE — Progress Notes (Signed)
Low DHEA. Waiting on cortisol levels

## 2023-03-09 LAB — COMPREHENSIVE METABOLIC PANEL
ALT: 12 IU/L (ref 0–32)
AST: 15 IU/L (ref 0–40)
Albumin/Globulin Ratio: 1.6 (ref 1.2–2.2)
Albumin: 4.2 g/dL (ref 3.8–4.9)
BUN/Creatinine Ratio: 22 (ref 9–23)
BUN: 17 mg/dL (ref 6–24)
CO2: 24 mmol/L (ref 20–29)
Chloride: 102 mmol/L (ref 96–106)
Globulin, Total: 2.7 g/dL (ref 1.5–4.5)
Glucose: 100 mg/dL — ABNORMAL HIGH (ref 70–99)
Potassium: 4.4 mmol/L (ref 3.5–5.2)
Total Protein: 6.9 g/dL (ref 6.0–8.5)
eGFR: 87 mL/min/{1.73_m2} (ref >59)

## 2023-03-09 LAB — LIPID PANEL
Chol/HDL Ratio: 3.2 ratio (ref 0.0–4.4)
HDL: 60 mg/dL (ref >39)
LDL Chol Calc (NIH): 110 mg/dL — ABNORMAL HIGH (ref 0–99)
Triglycerides: 131 mg/dL (ref 0–149)
VLDL Cholesterol Cal: 23 mg/dL (ref 5–40)

## 2023-03-09 LAB — CBC
Hemoglobin: 12.2 g/dL (ref 11.1–15.9)
MCHC: 34.7 g/dL (ref 31.5–35.7)
MCV: 89 fL (ref 79–97)
Platelets: 436 10*3/uL (ref 150–450)
RDW: 12.8 % (ref 11.7–15.4)

## 2023-03-09 LAB — CORTISOL, ACTH STIMULATION

## 2023-03-09 LAB — HEMOGLOBIN A1C
Est. average glucose Bld gHb Est-mCnc: 114 mg/dL
Hgb A1c MFr Bld: 5.6 % (ref 4.8–5.6)

## 2023-03-09 LAB — DHEA-SULFATE: DHEA-SO4: 1.6 ug/dL — ABNORMAL LOW (ref 29.4–220.5)

## 2023-03-11 NOTE — Assessment & Plan Note (Signed)
Continue to use triptan medication as needed and as prescribed

## 2023-03-11 NOTE — Assessment & Plan Note (Signed)
Lower duloxetine to 60 mg daily. Added wellbutrin SR 100 mg daily. Reassess in 6 weeks.

## 2023-03-15 DIAGNOSIS — M7061 Trochanteric bursitis, right hip: Secondary | ICD-10-CM | POA: Diagnosis not present

## 2023-03-20 ENCOUNTER — Ambulatory Visit: Payer: Federal, State, Local not specified - PPO | Admitting: Family Medicine

## 2023-03-20 ENCOUNTER — Encounter: Payer: Self-pay | Admitting: Family Medicine

## 2023-03-20 VITALS — BP 111/77 | HR 95 | Resp 18 | Ht 68.0 in | Wt 187.0 lb

## 2023-03-20 DIAGNOSIS — E2749 Other adrenocortical insufficiency: Secondary | ICD-10-CM

## 2023-03-20 DIAGNOSIS — Z6828 Body mass index (BMI) 28.0-28.9, adult: Secondary | ICD-10-CM | POA: Insufficient documentation

## 2023-03-20 DIAGNOSIS — F339 Major depressive disorder, recurrent, unspecified: Secondary | ICD-10-CM

## 2023-03-20 DIAGNOSIS — E663 Overweight: Secondary | ICD-10-CM | POA: Diagnosis not present

## 2023-03-20 NOTE — Assessment & Plan Note (Signed)
Provided education on DHEA and cortisol pathways and previous lab results revealing low cortisol and low DHEA.  Also provided education on the next step which is the ACTH stimulation test and ordered same.  Patient will return to have this lab work done, also sending referral to endocrinology so that they will be able to review results with her and determine management.

## 2023-03-20 NOTE — Assessment & Plan Note (Signed)
PHQ-9 score of 6, GAD-7 score of 0.  Continue Cymbalta 60 mg daily, bupropion 100 mg daily.  Will continue to monitor.

## 2023-03-20 NOTE — Progress Notes (Signed)
Established Patient Office Visit  Subjective   Patient ID: Christine Frank, female    DOB: July 28, 1966  Age: 57 y.o. MRN: [id number]  Chief Complaint  Patient presents with   Anxiety   Depression    HPI Christine Frank is a 57 y.o. female presenting today for follow up of mood.  She is also here to follow-up on lab work ordered by Herbert Seta at last appointment including cortisol and DHEA to check for Cushing's disease. Mood: Patient is here to follow up for depression, currently managing with Cymbalta and bupropion. Taking medication without side effects, reports excellent compliance with treatment. Denies mood changes or SI/HI. She feels mood is stable since last visit.  She is much happier with the decreased dose of Cymbalta with Wellbutrin added compared to the higher dose of Cymbalta. Denies chest pain, difficulty concentrating, dizziness, fatigue, insomnia, irritability, palpitations, panic attacks, racing thoughts, SOB, sweating. Denies anhedonia, depressed mood, difficulty concentrating, fatigue, feelings of worthlessness/guilt, hopelessness, hypersomnia, impaired memory, insomnia, psychomotor agitation, psychomotor retardation, recurrent thoughts of death, weight changes.     04-02-23    2:29 PM 02/06/2023    2:19 PM 10/02/2022    2:24 PM  Depression screen PHQ 2/9  Decreased Interest 1 3 0  Down, Depressed, Hopeless PHQ - 2 Score Altered sleeping 0 2 0  Tired, decreased energy Change in appetite 1 2 0  Feeling bad or failure about yourself  Trouble concentrating Moving slowly or fidgety/restless 0 0 0  Suicidal thoughts 0 0 0  PHQ-9 Score Difficult doing work/chores Somewhat difficult  Somewhat difficult       04-02-2023    2:29 PM 02/06/2023    2:21 PM 10/02/2022    2:24 PM 02/21/2022    2:58 PM  GAD 7 : Generalized Anxiety Score  Nervous, Anxious, on Edge 0 0 0 0  Control/stop worrying 0 0 0 0  Worry too much -  different things 0 0 0 0  Trouble relaxing 0 0 0 0  Restless 0 0 0 0  Easily annoyed or irritable 0 0 0 0  Afraid - awful might happen 0 0 0 0  Total GAD 7 Score 0 0 0 0  Anxiety Difficulty Not difficult at all  Not difficult at all Not difficult at all   ROS Negative unless otherwise noted in HPI   Objective:     BP 111/77 (BP Location: Left Arm, Patient Position: Sitting, Cuff Size: Large)   Pulse 95   Resp 18   Ht  (1.727 m)   Wt 187 lb (84.8 kg)   SpO2 95%   BMI 28.43 kg/m   Physical Exam Constitutional:      General: She is not in acute distress.    Appearance: Normal appearance.  HENT:     Head: Normocephalic and atraumatic.  Cardiovascular:     Rate and Rhythm: Normal rate and regular rhythm.     Pulses: Normal pulses.     Heart sounds: No murmur heard.    No friction rub. No gallop.  Pulmonary:     Effort: Pulmonary effort is normal. No respiratory distress.     Breath sounds: No wheezing, rhonchi or rales.  Skin:    General: Skin is warm and dry.     Comments: Thin hair, patches of hair loss behind ears  Neurological:  Mental Status: She is alert and oriented to person, place, and time.  Psychiatric:        Mood and Affect: Mood normal.     Assessment & Plan:  Hypocortisolism Assessment & Plan: Provided education on DHEA and cortisol pathways and previous lab results revealing low cortisol and low DHEA.  Also provided education on the next step which is the ACTH stimulation test and ordered same.  Patient will return to have this lab work done, also sending referral to endocrinology so that they will be able to review results with her and determine management.  Orders: -     Cortisol, ACTH Stimulation; Future -     Ambulatory referral to Endocrinology  Overweight with body mass index (BMI) of 28 to 28.9 in adult Assessment & Plan: Patient called insurance and they require prior authorizations for [place], [name], [name], [name].   Previously, she tried Ozempic and ended up with hypoglycemia.  I am hesitant to start her on another GLP-1 like [name] and [name] for this reason.  We are going to wait until lab results and workup has been completed regarding hormone levels and she is able to see endocrinology.  After that point, considering [name] (naltrexone-bupropion) or [name] (phentermine-topiramate).  Patient at this moment is more interested in [name] or phentermine alone, but after she sees endocrinology we will schedule a appointment dedicated to weight management to discuss medication options and how they may relate to endocrinology management of condition.   Recurrent major depressive disorder, remission status unspecified Assessment & Plan: PHQ-9 score of 6, GAD-7 score of 0.  Continue Cymbalta 60 mg daily, bupropion 100 mg daily.  Will continue to monitor.    Return in about 1 week (around 03/27/2023) for blood work (not fasting).  Patient will call to schedule next appointment with our office once she knows when her appointment with endocrinology will be.  I spent 35 minutes on the day of the encounter to include pre-visit record review, face-to-face time with the patient reviewing lab work and providing counseling/education, and post visit ordering of labwork.  Melida Quitter, PA

## 2023-03-20 NOTE — Assessment & Plan Note (Signed)
Patient called insurance and they require prior authorizations for [name], [name], [name].  Previously, she tried Ozempic and ended up with hypoglycemia.  I am hesitant to start her on another GLP-1 like [name] and [name] for this reason.  We are going to wait until lab results and workup has been completed regarding hormone levels and she is able to see endocrinology.  After that point, considering [name] (naltrexone-bupropion) or [name] (phentermine-topiramate).  Patient at this moment is more interested in [name] or phentermine alone, but after she sees endocrinology we will schedule a appointment dedicated to weight management to discuss medication options and how they may relate to endocrinology management of condition.

## 2023-03-23 ENCOUNTER — Encounter: Payer: Self-pay | Admitting: Neurology

## 2023-03-23 ENCOUNTER — Other Ambulatory Visit: Payer: Self-pay | Admitting: Neurology

## 2023-03-23 DIAGNOSIS — R51 Headache with orthostatic component, not elsewhere classified: Secondary | ICD-10-CM

## 2023-03-26 ENCOUNTER — Other Ambulatory Visit: Payer: Self-pay

## 2023-03-26 DIAGNOSIS — R51 Headache with orthostatic component, not elsewhere classified: Secondary | ICD-10-CM

## 2023-03-26 MED ORDER — GABAPENTIN 300 MG PO CAPS
ORAL_CAPSULE | ORAL | 0 refills | Status: DC
Start: 2023-03-26 — End: 2023-06-14

## 2023-03-26 MED ORDER — RIZATRIPTAN BENZOATE 10 MG PO TABS: ORAL_TABLET | ORAL | 3 refills | Status: DC

## 2023-03-26 MED ORDER — PROMETHAZINE HCL 25 MG PO TABS: 25.0000 mg | ORAL_TABLET | Freq: Four times a day (QID) | ORAL | 3 refills | Status: DC | PRN

## 2023-03-26 NOTE — Telephone Encounter (Signed)
Requested Prescriptions   Pending Prescriptions Disp Refills   gabapentin (NEURONTIN) 300 MG capsule 84 capsule 0    Sig: TAKE 1 CAPSULE BY MOUTH THREE TIMES DAILY   promethazine (PHENERGAN) 25 MG tablet 30 tablet 0    Sig: Take 1 tablet (25 mg total) by mouth every 6 (six) hours as needed.   rizatriptan (MAXALT) 10 MG tablet 10 tablet 3    Sig: TAKE 1 TABLET BY MOUTH DAILY (MAY REPEAT IN TWO HOURS IF NEEDED. MAX TWO PER DAY)   Last seen 09/14/21, upcoming appt on 07/04/23. She will need enough to get to appt  Routing to provider to fill  Dispenses   Dispensed Days Supply Quantity Provider Pharmacy  gabapentin 300 mg capsule 11/13/2022 28 84 each Anson Fret, MD [facility]  gabapentin 300 mg capsule 07/25/2022 90 270 each Anson Fret, MD [facility]  GABAPENTIN 300MG  CAP 03/28/2022 90 270 capsule Anson Fret, MD [facility]Marland KitchenMarland Kitchen

## 2023-03-27 ENCOUNTER — Other Ambulatory Visit: Payer: Federal, State, Local not specified - PPO

## 2023-03-27 DIAGNOSIS — E2749 Other adrenocortical insufficiency: Secondary | ICD-10-CM

## 2023-03-28 LAB — CORTISOL, ACTH STIMULATION: Cortisol Baseline: 4.6 ug/dL — ABNORMAL LOW (ref 6.2–19.4)

## 2023-03-29 ENCOUNTER — Other Ambulatory Visit: Payer: Self-pay | Admitting: Family Medicine

## 2023-03-29 ENCOUNTER — Ambulatory Visit: Payer: Federal, State, Local not specified - PPO

## 2023-03-29 DIAGNOSIS — E2749 Other adrenocortical insufficiency: Secondary | ICD-10-CM

## 2023-04-18 ENCOUNTER — Other Ambulatory Visit: Payer: Self-pay | Admitting: Nurse Practitioner

## 2023-04-18 DIAGNOSIS — M7061 Trochanteric bursitis, right hip: Secondary | ICD-10-CM | POA: Diagnosis not present

## 2023-04-18 DIAGNOSIS — F339 Major depressive disorder, recurrent, unspecified: Secondary | ICD-10-CM

## 2023-04-26 ENCOUNTER — Other Ambulatory Visit: Payer: Self-pay | Admitting: Nurse Practitioner

## 2023-04-26 DIAGNOSIS — J3089 Other allergic rhinitis: Secondary | ICD-10-CM

## 2023-05-09 ENCOUNTER — Other Ambulatory Visit: Payer: Self-pay

## 2023-05-09 DIAGNOSIS — E2749 Other adrenocortical insufficiency: Secondary | ICD-10-CM

## 2023-05-11 ENCOUNTER — Other Ambulatory Visit: Payer: Self-pay

## 2023-05-11 DIAGNOSIS — E2749 Other adrenocortical insufficiency: Secondary | ICD-10-CM

## 2023-05-14 ENCOUNTER — Other Ambulatory Visit (INDEPENDENT_AMBULATORY_CARE_PROVIDER_SITE_OTHER): Payer: Federal, State, Local not specified - PPO

## 2023-05-14 DIAGNOSIS — E2749 Other adrenocortical insufficiency: Secondary | ICD-10-CM

## 2023-05-16 ENCOUNTER — Encounter: Payer: Self-pay | Admitting: "Endocrinology

## 2023-05-16 ENCOUNTER — Ambulatory Visit: Payer: Federal, State, Local not specified - PPO | Admitting: "Endocrinology

## 2023-05-16 VITALS — BP 125/90 | HR 94 | Ht 65.0 in | Wt 193.0 lb

## 2023-05-16 DIAGNOSIS — E2749 Other adrenocortical insufficiency: Secondary | ICD-10-CM | POA: Diagnosis not present

## 2023-05-16 DIAGNOSIS — L659 Nonscarring hair loss, unspecified: Secondary | ICD-10-CM

## 2023-05-16 DIAGNOSIS — R5382 Chronic fatigue, unspecified: Secondary | ICD-10-CM

## 2023-05-16 NOTE — Progress Notes (Signed)
Outpatient Endocrinology Note [name], MD    Christine Frank 1965-12-24 [id number]  Referring Provider: Melida Quitter, PA Primary Care Provider: Melida Quitter, PA Reason for consultation: Subjective   Assessment & Plan  Rickiya was seen today for cortisol.  Diagnoses and all orders for this visit:  Hypocortisolism (HCC) -     DHEA-sulfate; Future -     ACTH; Future -     Cortisol; Future -     T4, free  Chronic fatigue  Hair loss  Patient has been on injectable steroids for her back/hip since past 8 years, last done in January 2024.  Has been getting it every 3 months, however previously at 1 point she was getting it monthly for 6 to 12 months. 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 Denies any weight loss, abdominal pain, nausea/vomiting DHEA-sulfate was low at 1.6 two months ago and cortisol at 5.9-drawn at 9:22am in 4224.  Recommend redrawing labs at 8 AM on Monday morning, after 2 nights of regular rest at night.  Will also draw free T4 to complete thyroid workup, although TSH has been normal in the past, including in 02/2023.  May need ACTH stimulation test based on results   No follow-ups on file. Follow-up will be determined and after lab work  I have reviewed current medications, nurse's notes, allergies, vital signs, past medical and surgical history, family medical history, and social history for this encounter. Counseled patient on symptoms, examination findings, lab findings, imaging results, treatment decisions and monitoring and prognosis. The patient understood the recommendations and agrees with the treatment plan. All questions regarding treatment plan were fully answered.  Altamese Buckman, MD  05/16/23   History of  Present Illness HPI  Christine Frank is a 57 y.o. year old female who presents for evaluation as new patient.   Pt thinks she may have Cushing's. Referred here to r/o adrenal insufficiency   Denies any weight loss, abdominal pain, nausea/vomiting Is a nurse, works in hospice 3pm-Mn Tue-Fri.  Patient started getting steroid shots in back/hip since 8 years. Got about 4 shots last year, last in 11/2022.   C/o fatigue since past 5 years, comes and goes C/o losing eye brows since 2023 Gained 50 lbs in past 5 years   Physical Exam  BP (!) 125/90   Pulse 94   Ht 5\' 5"  (1.651 m)   Wt 193 lb (87.5 kg)   SpO2 99%   BMI 32.12 kg/m    Constitutional: well developed, well nourished Head: normocephalic, atraumatic, flushed face Eyes: sclera anicteric, no redness Neck: supple, positive dorsocervical hump, no supraclavicular fat pad Lungs: normal respiratory effort Neurology: alert and oriented Skin: dry, no appreciable rashes Musculoskeletal: no appreciable defects Psychiatric: normal mood and affect   Current Medications Patient's Medications  New Prescriptions   No medications on file  Previous Medications   BACLOFEN (LIORESAL) 10 MG TABLET    Take 1 tablet (10 mg total) by mouth 2 (two) times daily. Take 1/2 to 1 tablet 2 x day if needed for muscle spasm   BIOTIN PO    10,000 mcg oral once daily   BUPROPION ER (WELLBUTRIN SR) 100 MG 12 HR TABLET    TAKE  1 TABLET BY MOUTH DAILY   CETIRIZINE HCL 10 MG CAPS    Take 1 capsule (10 mg total) by mouth daily for 10 days.   CRANBERRY PO    Take by mouth daily. With vitamin C   DICLOFENAC SODIUM (VOLTAREN) 1 % GEL    Apply 1 application topically 3 (three) times daily as needed.   DOCUSATE SODIUM (COLACE) 100 MG CAPSULE    Take 100 mg by mouth 2 (two) times daily.   DULOXETINE (CYMBALTA) 60 MG CAPSULE    Take 1 capsule (60 mg total) by mouth daily.   FLUTICASONE (FLONASE) 50 MCG/ACT NASAL SPRAY    INSTILL 1 SPRAY IN EACH NOSTRIL EVERY  DAY   GABAPENTIN (NEURONTIN) 300 MG CAPSULE    TAKE 1 CAPSULE BY MOUTH THREE TIMES DAILY   HYDROCODONE-ACETAMINOPHEN (NORCO) 10-325 MG TABLET    Take 1 tablet by mouth 4 (four) times daily.    IBUPROFEN (ADVIL) 600 MG TABLET    Take 1 tablet (600 mg total) by mouth every 6 (six) hours as needed.   MELATONIN PO    Take by mouth.   MORPHINE (MS CONTIN) 30 MG 12 HR TABLET    Take 1 tablet by mouth 2 (two) times daily.   MULTIPLE VITAMIN (MULTIVITAMIN) CAPSULE    Take 1 capsule by mouth daily.   PROMETHAZINE (PHENERGAN) 25 MG TABLET    Take 1 tablet (25 mg total) by mouth every 6 (six) hours as needed.   RIZATRIPTAN (MAXALT) 10 MG TABLET    TAKE 1 TABLET BY MOUTH DAILY (MAY REPEAT IN TWO HOURS IF NEEDED. MAX TWO PER DAY)   SUMATRIPTAN SUCCINATE (ZEMBRACE SYMTOUCH) 3 MG/0.5ML SOAJ    Inject 3 mg into the skin every 1 hour as needed for migraine. Max 4 injections in one day.   VITAMIN D PO    Take by mouth.  Modified Medications   No medications on file  Discontinued Medications   No medications on file    Allergies Allergies  Allergen Reactions   Sulfa Antibiotics Swelling and Rash    Past Medical History Past Medical History:  Diagnosis Date   Allergy    Anemia    Chronic hip pain    Chronic leg pain    bilateral   Depression    Migraine    PMDD (premenstrual dysphoric disorder)    Restless leg    S/P epidural steroid injection    affects legs is seen at pain clinic.    Past Surgical History Past Surgical History:  Procedure Laterality Date   CERVICAL CERCLAGE     CESAREAN SECTION     x 2   CESAREAN SECTION  1994   CESAREAN SECTION  1999   KNEE SURGERY Right 11/27/2019   torn meniscus and torn cartilage repaired    Family History family history includes Alcohol abuse in her maternal grandfather and maternal grandmother; Cerebral aneurysm in her maternal aunt and mother; Diabetes in her father; Heart attack in her paternal grandfather; Hyperlipidemia in her father;  Hypertension in her father; Migraines in her maternal aunt.  Social History Social History   Socioeconomic History   Marital status: Widowed    Spouse name: Not on file   Number of children: 2   Years of education: Not on file   Highest education level: Bachelor's degree (e.g., BA, AB, BS)  Occupational History   Not on file  Tobacco Use   Smoking status: Never    Passive exposure: Never  Smokeless tobacco: Never   Tobacco comments:    tried tobacco a few times as a teen, never picked up the habit  Vaping Use   Vaping Use: Never used  Substance and Sexual Activity   Alcohol use: Yes    Alcohol/week: 0.0 standard drinks of alcohol    Comment: Rare; 2-3 times per year socially   Drug use: No   Sexual activity: Yes    Birth control/protection: None    Comment: 1st intercourse 4 yo-5 partners  Other Topics Concern   Not on file  Social History Narrative   Lives at home with her 2 sons. Has 6 children total, 4 from previous marriage. Husband passed away 1 year ago.   Right handed   Daily less than one cup of coffee & three 8-12 oz cups of decaffeinated soda daily   Social Determinants of Health   Financial Resource Strain: Not on file  Food Insecurity: Not on file  Transportation Needs: Not on file  Physical Activity: Not on file  Stress: Not on file  Social Connections: Not on file  Intimate Partner Violence: Not on file    Lab Results  Component Value Date   CHOL 193 03/07/2023   Lab Results  Component Value Date   HDL 60 03/07/2023   Lab Results  Component Value Date   LDLCALC 110 (H) 03/07/2023   Lab Results  Component Value Date   TRIG 131 03/07/2023   Lab Results  Component Value Date   CHOLHDL 3.2 03/07/2023   Lab Results  Component Value Date   CREATININE 0.79 03/07/2023   No results found for: "GFR"    Component Value Date/Time   NA 140 03/07/2023 0922   K 4.4 03/07/2023 0922   CL 102 03/07/2023 0922   CO2 24 03/07/2023 0922    GLUCOSE 100 (H) 03/07/2023 0922   GLUCOSE 161 (H) 06/16/2018 1512   BUN 17 03/07/2023 0922   CREATININE 0.79 03/07/2023 0922   CREATININE 0.65 08/05/2015 1230   CALCIUM 9.2 03/07/2023 0922   PROT 6.9 03/07/2023 0922   ALBUMIN 4.2 03/07/2023 0922   AST 15 03/07/2023 0922   ALT 12 03/07/2023 0922   ALKPHOS 91 03/07/2023 0922   BILITOT 0.4 03/07/2023 0922   GFRNONAA 95 09/06/2020 0840   GFRNONAA >89 08/05/2015 1230   GFRAA 110 09/06/2020 0840   GFRAA >89 08/05/2015 1230      Latest Ref Rng & Units 03/07/2023    9:22 AM 10/09/2022   11:09 AM 09/06/2020    8:40 AM  BMP  Glucose 70 - 99 mg/dL 161  096  045   BUN 6 - 24 mg/dL 17  12  8    Creatinine 0.57 - 1.00 mg/dL 4.09  8.11  9.14   BUN/Creat Ratio 9 - 23 22  16  11    Sodium 134 - 144 mmol/L 140  141  140   Potassium 3.5 - 5.2 mmol/L 4.4  4.4  4.0   Chloride 96 - 106 mmol/L 102  103  100   CO2 20 - 29 mmol/L 24  24  26    Calcium 8.7 - 10.2 mg/dL 9.2  9.3  9.7        Component Value Date/Time   WBC 6.2 03/07/2023 0922   WBC 21.5 (H) 06/16/2018 1512   RBC 3.96 03/07/2023 0922   RBC 4.74 06/16/2018 1512   HGB 12.2 03/07/2023 0922   HCT 35.2 03/07/2023 0922   PLT 436 03/07/2023 0922  MCV 89 03/07/2023 0922   MCH 30.8 03/07/2023 0922   MCH 31.9 06/16/2018 1512   MCHC 34.7 03/07/2023 0922   MCHC 34.4 06/16/2018 1512   RDW 12.8 03/07/2023 0922   LYMPHSABS 2.3 10/09/2022 1109   MONOABS 0.7 08/05/2015 1230   EOSABS 0.3 10/09/2022 1109   BASOSABS 0.1 10/09/2022 1109   Lab Results  Component Value Date   TSH 1.480 03/07/2023   TSH 0.841 10/09/2022   TSH 1.710 09/06/2020         Parts of this note may have been dictated using voice recognition software. There may be variances in spelling and vocabulary which are unintentional. Not all errors are proofread. Please notify the Thereasa Parkin if any discrepancies are noted or if the meaning of any statement is not clear.

## 2023-05-17 DIAGNOSIS — Z79891 Long term (current) use of opiate analgesic: Secondary | ICD-10-CM | POA: Diagnosis not present

## 2023-05-17 DIAGNOSIS — M7061 Trochanteric bursitis, right hip: Secondary | ICD-10-CM | POA: Diagnosis not present

## 2023-05-18 LAB — ACTH: C206 ACTH: 37 pg/mL (ref 6–50)

## 2023-05-28 ENCOUNTER — Other Ambulatory Visit: Payer: Federal, State, Local not specified - PPO

## 2023-05-28 NOTE — Progress Notes (Signed)
Low cortisol levels.  Referral placed to endocrinology.

## 2023-06-12 ENCOUNTER — Other Ambulatory Visit: Payer: Self-pay | Admitting: Family Medicine

## 2023-06-12 DIAGNOSIS — F339 Major depressive disorder, recurrent, unspecified: Secondary | ICD-10-CM

## 2023-06-14 ENCOUNTER — Other Ambulatory Visit: Payer: Self-pay | Admitting: Neurology

## 2023-06-14 DIAGNOSIS — R51 Headache with orthostatic component, not elsewhere classified: Secondary | ICD-10-CM

## 2023-06-14 DIAGNOSIS — M7061 Trochanteric bursitis, right hip: Secondary | ICD-10-CM | POA: Diagnosis not present

## 2023-07-04 ENCOUNTER — Ambulatory Visit: Payer: Federal, State, Local not specified - PPO | Admitting: Neurology

## 2023-07-04 ENCOUNTER — Encounter: Payer: Self-pay | Admitting: Neurology

## 2023-07-04 DIAGNOSIS — G43709 Chronic migraine without aura, not intractable, without status migrainosus: Secondary | ICD-10-CM

## 2023-07-04 DIAGNOSIS — R51 Headache with orthostatic component, not elsewhere classified: Secondary | ICD-10-CM | POA: Diagnosis not present

## 2023-07-04 MED ORDER — RIZATRIPTAN BENZOATE 10 MG PO TABS: ORAL_TABLET | ORAL | 11 refills | Status: DC

## 2023-07-04 MED ORDER — ZEMBRACE SYMTOUCH 3 MG/0.5ML ~~LOC~~ SOAJ: SUBCUTANEOUS | 10 refills | Status: DC

## 2023-07-04 MED ORDER — GABAPENTIN 300 MG PO CAPS
ORAL_CAPSULE | ORAL | 3 refills | Status: DC
Start: 2023-07-04 — End: 2024-07-23

## 2023-07-04 NOTE — Progress Notes (Signed)
Patient: Christine Frank Date of Birth: 01/27/1966  Reason for Visit: Follow up History from: Patient Primary Neurologist: Lucia Gaskins   ASSESSMENT AND PLAN 57 y.o. year old female   1.  Chronic migraine headaches -Under excellent control -Continue gabapentin 300 mg 3 times daily for migraine preventative -Continue Maxalt for acute migraine headache, Zembrace for severe migraine, may use Phenergan for nausea with migraine -Discussed with PCP if willing to refill medications can return here, otherwise follow-up 1 year  Meds ordered this encounter  Medications   SUMAtriptan Succinate (ZEMBRACE SYMTOUCH) 3 MG/0.5ML SOAJ    Sig: Inject 3 mg into the skin every 1 hour as needed for migraine. Max 4 injections in one day.    Dispense:  6 mL    Refill:  10    Cancel previous Rx. Prescription clarification. Please setup patient for savings program. Medications tried: Rizatriptan, Fiorinal.   rizatriptan (MAXALT) 10 MG tablet    Sig: TAKE 1 TABLET BY MOUTH DAILY (MAY REPEAT IN TWO HOURS IF NEEDED. MAX TWO PER DAY)    Dispense:  10 tablet    Refill:  11    Must be seen for appt.   gabapentin (NEURONTIN) 300 MG capsule    Sig: TAKE 1 CAPSULE BY MOUTH 3 TIMES DAILY    Dispense:  270 capsule    Refill:  3   HISTORY OF PRESENT ILLNESS: Today 07/04/23 Here for follow-up.  Last seen October 2022.  At the time taking gabapentin for migraine preventative, Maxalt or Zembrace as needed. Remains on gabapentin 300 mg TID. Migraines under really good control, none this month, last month, woke up with 1, then takes Zembrace with excellent benefit. For milder headache will take Maxalt. Takes Maxalt 5-6 times a month, to prevent milder headache from , developing into migraine. Takes phenergan PRN, takes rarely. Chronic hip pain, on morphine. Works as Armed forces technical officer.   HISTORY  Dr. Lucia Gaskins: Interval history September 14, 2021: Patient is here for follow-up of migraines, saw her about a year ago when she was  doing well on gabapentin, Maxalt was working well acutely for her, we did discuss other medications to try if needed.  I review of her chart does not show any recent migraine activity or ED visits for migraines headaches.  We did order Zembrace for headaches that she wakes up in the morning, injectable sumatriptan. She takes rizatriptan occ. She has used the zembrace only 2x in last year. The gabapentin helps. She works from home. She is happy with her management, we discussed other options, continue as is.   REVIEW OF SYSTEMS: Out of a complete 14 system review of symptoms, the patient complains only of the following symptoms, and all other reviewed systems are negative.  See HPI  ALLERGIES: Allergies  Allergen Reactions   Sulfa Antibiotics Swelling and Rash    HOME MEDICATIONS: Outpatient Medications Prior to Visit  Medication Sig Dispense Refill   baclofen (LIORESAL) 10 MG tablet Take 1 tablet (10 mg total) by mouth 2 (two) times daily. Take 1/2 to 1 tablet 2 x day if needed for muscle spasm 60 tablet 0   BIOTIN PO 10,000 mcg oral once daily     buPROPion ER (WELLBUTRIN SR) 100 MG 12 hr tablet TAKE 1 TABLET BY MOUTH DAILY 30 tablet 1   CRANBERRY PO Take by mouth daily. With vitamin C     diclofenac sodium (VOLTAREN) 1 % GEL Apply 1 application topically 3 (three) times daily as needed.  1   docusate sodium (COLACE) 100 MG capsule Take 100 mg by mouth 2 (two) times daily.     fluticasone (FLONASE) 50 MCG/ACT nasal spray INSTILL 1 SPRAY IN EACH NOSTRIL EVERY DAY 16 g 0   HYDROcodone-acetaminophen (NORCO) 10-325 MG tablet Take 1 tablet by mouth in the morning, at noon, and at bedtime.     ibuprofen (ADVIL) 600 MG tablet Take 1 tablet (600 mg total) by mouth every 6 (six) hours as needed. 30 tablet 0   magnesium oxide (MAG-OX) 400 (240 Mg) MG tablet Take 400 mg by mouth daily.     MELATONIN PO Take by mouth.     morphine (MS CONTIN) 30 MG 12 hr tablet Take 1 tablet by mouth 2 (two) times  daily.     Multiple Vitamin (MULTIVITAMIN) capsule Take 1 capsule by mouth daily.     promethazine (PHENERGAN) 25 MG tablet Take 1 tablet (25 mg total) by mouth every 6 (six) hours as needed. 30 tablet 3   VITAMIN D PO Take by mouth.     gabapentin (NEURONTIN) 300 MG capsule TAKE 1 CAPSULE BY MOUTH 3 TIMES DAILY 90 capsule 0   rizatriptan (MAXALT) 10 MG tablet TAKE 1 TABLET BY MOUTH DAILY (MAY REPEAT IN TWO HOURS IF NEEDED. MAX TWO PER DAY) 10 tablet 3   SUMAtriptan Succinate (ZEMBRACE SYMTOUCH) 3 MG/0.5ML SOAJ Inject 3 mg into the skin every 1 hour as needed for migraine. Max 4 injections in one day. 6 mL 10   Cetirizine HCl 10 MG CAPS Take 1 capsule (10 mg total) by mouth daily for 10 days. 10 capsule 0   DULoxetine (CYMBALTA) 60 MG capsule Take 1 capsule (60 mg total) by mouth daily. 90 capsule 1   Facility-Administered Medications Prior to Visit  Medication Dose Route Frequency Provider Last Rate Last Admin   0.9 %  sodium chloride infusion  500 mL Intravenous Continuous Nandigam, Eleonore Chiquito, MD        PAST MEDICAL HISTORY: Past Medical History:  Diagnosis Date   Allergy    Anemia    Chronic hip pain    Chronic leg pain    bilateral   Depression    Migraine    PMDD (premenstrual dysphoric disorder)    Restless leg    S/P epidural steroid injection    affects legs is seen at pain clinic.    PAST SURGICAL HISTORY: Past Surgical History:  Procedure Laterality Date   CERVICAL CERCLAGE     CESAREAN SECTION     x 2   CESAREAN SECTION  1994   CESAREAN SECTION  1999   KNEE SURGERY Right 11/27/2019   torn meniscus and torn cartilage repaired    FAMILY HISTORY: Family History  Problem Relation Age of Onset   Hypertension Father    Diabetes Father    Hyperlipidemia Father    Alcohol abuse Maternal Grandmother    Alcohol abuse Maternal Grandfather    Heart attack Paternal Grandfather    Cerebral aneurysm Mother    Cerebral aneurysm Maternal Aunt    Migraines Maternal Aunt     Colon cancer Neg Hx    Esophageal cancer Neg Hx    Rectal cancer Neg Hx    Stomach cancer Neg Hx     SOCIAL HISTORY: Social History   Socioeconomic History   Marital status: Widowed    Spouse name: Not on file   Number of children: 6   Years of education: Not on file  Highest education level: Bachelor's degree (e.g., BA, AB, BS)  Occupational History   Not on file  Tobacco Use   Smoking status: Never    Passive exposure: Never   Smokeless tobacco: Never   Tobacco comments:    tried tobacco a few times as a teen, never picked up the habit  Vaping Use   Vaping status: Never Used  Substance and Sexual Activity   Alcohol use: Not Currently    Comment: Rare; 2-3 times per year socially   Drug use: No   Sexual activity: Not Currently    Comment: 1st intercourse 2 yo-5 partners  Other Topics Concern   Not on file  Social History Narrative   Lives at home with her 2 sons. Has 6 children total, 4 from previous marriage. Husband passed away 1 year ago.   Right handed   Daily less than one cup of coffee & three 8-12 oz cups of decaffeinated soda daily   Social Determinants of Health   Financial Resource Strain: Not on file  Food Insecurity: Not on file  Transportation Needs: Not on file  Physical Activity: Not on file  Stress: Not on file  Social Connections: Not on file  Intimate Partner Violence: Not on file    PHYSICAL EXAM  Vitals:   07/04/23 1451  BP: 125/87  Pulse: (!) 103  Weight: 192 lb 14.4 oz (87.5 kg)  Height: 5\' 5"  (1.651 m)   Body mass index is 32.1 kg/m.  Generalized: Well developed, in no acute distress  Neurological examination  Mentation: Alert oriented to time, place, history taking. Follows all commands speech and language fluent Cranial nerve II-XII: Pupils were equal round reactive to light. Extraocular movements were full, visual field were full on confrontational test. Facial sensation and strength were normal. Head turning and  shoulder shrug  were normal and symmetric. Motor: Good strength all extremities, exception left hip flexion 4/5 Sensory: Sensory testing is intact to soft touch on all 4 extremities. No evidence of extinction is noted.  Coordination: Cerebellar testing reveals good finger-nose-finger and heel-to-shin bilaterally.  Gait and station: Gait is normal, but slight limp on the left Reflexes: Deep tendon reflexes are symmetric and normal bilaterally.   DIAGNOSTIC DATA (LABS, IMAGING, TESTING) - I reviewed patient records, labs, notes, testing and imaging myself where available.  Lab Results  Component Value Date   WBC 6.2 03/07/2023   HGB 12.2 03/07/2023   HCT 35.2 03/07/2023   MCV 89 03/07/2023   PLT 436 03/07/2023      Component Value Date/Time   NA 140 03/07/2023 0922   K 4.4 03/07/2023 0922   CL 102 03/07/2023 0922   CO2 24 03/07/2023 0922   GLUCOSE 100 (H) 03/07/2023 0922   GLUCOSE 161 (H) 06/16/2018 1512   BUN 17 03/07/2023 0922   CREATININE 0.79 03/07/2023 0922   CREATININE 0.65 08/05/2015 1230   CALCIUM 9.2 03/07/2023 0922   PROT 6.9 03/07/2023 0922   ALBUMIN 4.2 03/07/2023 0922   AST 15 03/07/2023 0922   ALT 12 03/07/2023 0922   ALKPHOS 91 03/07/2023 0922   BILITOT 0.4 03/07/2023 0922   GFRNONAA 95 09/06/2020 0840   GFRNONAA >89 08/05/2015 1230   GFRAA 110 09/06/2020 0840   GFRAA >89 08/05/2015 1230   Lab Results  Component Value Date   CHOL 193 03/07/2023   HDL 60 03/07/2023   LDLCALC 110 (H) 03/07/2023   TRIG 131 03/07/2023   CHOLHDL 3.2 03/07/2023   Lab Results  Component Value Date   HGBA1C 5.6 03/07/2023   Lab Results  Component Value Date   VITAMINB12 654 04/22/2015   Lab Results  Component Value Date   TSH 1.480 03/07/2023    Margie Ege, AGNP-C, DNP 07/04/2023, 3:23 PM [facility] [address] [place], [place] [address] [phone]

## 2023-07-04 NOTE — Patient Instructions (Signed)
Great to meet you today.  We will continue current medications.  I will send refills.  Please let me know if you need anything.  Discussed with your primary care doctor we will be willing to refill your medications going forward.  If so you can return here as needed.  Thanks!!

## 2023-07-10 ENCOUNTER — Other Ambulatory Visit: Payer: Self-pay | Admitting: Neurology

## 2023-07-16 ENCOUNTER — Telehealth: Payer: Self-pay | Admitting: Neurology

## 2023-07-16 MED ORDER — RIZATRIPTAN BENZOATE 10 MG PO TABS: ORAL_TABLET | ORAL | 11 refills | Status: DC

## 2023-07-16 NOTE — Telephone Encounter (Signed)
Returned call to pt and she stated that she saw sarah and never got the refill for maxalt she stated the pharmacy mentioned that it was discontinued by provider. I sent refill. As I saw in last progress note slack np said to continue it

## 2023-07-16 NOTE — Telephone Encounter (Signed)
Pt states she has been told by [facility] that Maralyn Sago, NP stopped her  rizatriptan (MAXALT) 10 MG tablet.  Pt is asking for a call to discuss.

## 2023-07-17 DIAGNOSIS — M7061 Trochanteric bursitis, right hip: Secondary | ICD-10-CM | POA: Diagnosis not present

## 2023-08-08 ENCOUNTER — Ambulatory Visit: Payer: Federal, State, Local not specified - PPO | Admitting: Family Medicine

## 2023-08-08 ENCOUNTER — Encounter: Payer: Self-pay | Admitting: Family Medicine

## 2023-08-08 VITALS — BP 112/75 | HR 76 | Resp 18 | Ht 65.0 in | Wt 186.0 lb

## 2023-08-08 DIAGNOSIS — F339 Major depressive disorder, recurrent, unspecified: Secondary | ICD-10-CM | POA: Diagnosis not present

## 2023-08-08 DIAGNOSIS — E663 Overweight: Secondary | ICD-10-CM | POA: Diagnosis not present

## 2023-08-08 DIAGNOSIS — Z23 Encounter for immunization: Secondary | ICD-10-CM | POA: Diagnosis not present

## 2023-08-08 DIAGNOSIS — L659 Nonscarring hair loss, unspecified: Secondary | ICD-10-CM | POA: Diagnosis not present

## 2023-08-08 DIAGNOSIS — F411 Generalized anxiety disorder: Secondary | ICD-10-CM

## 2023-08-08 DIAGNOSIS — Z6828 Body mass index (BMI) 28.0-28.9, adult: Secondary | ICD-10-CM

## 2023-08-08 DIAGNOSIS — Z1231 Encounter for screening mammogram for malignant neoplasm of breast: Secondary | ICD-10-CM

## 2023-08-08 MED ORDER — FLUOXETINE HCL 10 MG PO TABS: 10.0000 mg | ORAL_TABLET | Freq: Every day | ORAL | 3 refills | Status: DC

## 2023-08-08 MED ORDER — BUPROPION HCL ER (SR) 100 MG PO TB12
100.0000 mg | ORAL_TABLET | Freq: Every day | ORAL | 1 refills | Status: DC
Start: 2023-08-08 — End: 2023-10-23

## 2023-08-08 NOTE — Progress Notes (Signed)
Acute Office Visit  Subjective:     Patient ID: Christine Frank, female    DOB: 1966/03/31, 57 y.o.   MRN: [id number]  No chief complaint on file.   HPI Patient is in today to "discuss health concerns".  Endocrinology did not believe that her labs showed anything concerning.  She would like to start medication for weight loss, she has a history of hypoglycemia because GLP-1 injections decrease her appetite too much and she stopped eating.  She also is needing a referral to dermatology for suspected frontal alopecia. Mood: She ran out of her Cymbalta prescription and was not able to get in touch with our office.  Taking the Wellbutrin on its own was worsening her anxiety, so she weaned herself off as well.  She feels that her mood has worsened since stopping the medications.  She does note that the Cymbalta was never helpful even when taking it.  She has taken Prozac about 20 years ago and remembers that it was helpful and she does not remember any side effects with it.  She would like to restart it.      08/08/2023    1:52 PM 03/20/2023    2:29 PM 02/06/2023    2:19 PM  Depression screen PHQ 2/9  Decreased Interest 2 1 3   Down, Depressed, Hopeless 1 1 2   PHQ - 2 Score 3 2 5   Altered sleeping 2 0 2  Tired, decreased energy 3 1 3   Change in appetite 1 1 2   Feeling bad or failure about yourself  1 1 3   Trouble concentrating 1 1 2   Moving slowly or fidgety/restless 0 0 0  Suicidal thoughts 0 0 0  PHQ-9 Score 11 6 17   Difficult doing work/chores Very difficult Somewhat difficult        08/08/2023    1:53 PM 03/20/2023    2:29 PM 02/06/2023    2:21 PM 10/02/2022    2:24 PM  GAD 7 : Generalized Anxiety Score  Nervous, Anxious, on Edge 1 0 0 0  Control/stop worrying 1 0 0 0  Worry too much - different things 1 0 0 0  Trouble relaxing 1 0 0 0  Restless 0 0 0 0  Easily annoyed or irritable 1 0 0 0  Afraid - awful might happen 0 0 0 0  Total GAD 7 Score 5 0 0 0  Anxiety Difficulty  Somewhat difficult Not difficult at all  Not difficult at all   ROS Negative unless otherwise noted in HPI    Objective:    BP 112/75   Pulse 76   Resp 18   Ht 5\' 5"  (1.651 m)   Wt 186 lb (84.4 kg)   SpO2 97%   BMI 30.95 kg/m   Physical Exam Constitutional:      General: She is not in acute distress.    Appearance: Normal appearance.  HENT:     Head: Normocephalic and atraumatic.  Cardiovascular:     Rate and Rhythm: Normal rate and regular rhythm.     Heart sounds: No murmur heard.    No friction rub. No gallop.  Pulmonary:     Effort: Pulmonary effort is normal. No respiratory distress.     Breath sounds: No wheezing, rhonchi or rales.  Musculoskeletal:     Cervical back: Normal range of motion.  Skin:    General: Skin is warm and dry.  Neurological:     General: No focal deficit present.  Mental Status: She is alert and oriented to person, place, and time. Mental status is at baseline.  Psychiatric:        Mood and Affect: Mood normal.        Thought Content: Thought content normal.        Judgment: Judgment normal.      Assessment & Plan:  Recurrent major depressive disorder, remission status unspecified (HCC) Assessment & Plan: PHQ-9 score 14, GAD-7 score 5.  Restart Prozac 10 mg daily and bupropion 100 mg daily.  Will follow-up in about 6 weeks to assess efficacy and adjust as needed.  Orders: -     buPROPion HCl ER (SR); Take 1 tablet (100 mg total) by mouth daily.  Dispense: 90 tablet; Refill: 1 -     FLUoxetine HCl; Take 1 tablet (10 mg total) by mouth daily.  Dispense: 90 tablet; Refill: 3  GAD (generalized anxiety disorder) -     FLUoxetine HCl; Take 1 tablet (10 mg total) by mouth daily.  Dispense: 90 tablet; Refill: 3  Overweight with body mass index (BMI) of 28 to 28.9 in adult Assessment & Plan: Patient is interested in both Wellbutrin and phentermine for weight management.  Starting with improving mood symptoms with Prozac and Wellbutrin.   Wellbutrin is also helpful for cravings, which she has noticed since discontinuing Wellbutrin.  If tolerating well and mood is stable at follow-up, start low-dose of phentermine.  Will continue to monitor.   Alopecia -     Ambulatory referral to Dermatology  Need for influenza vaccination -     Flu vaccine trivalent PF, 6mos and older(Flulaval,Afluria,Fluarix,Fluzone)  Screening mammogram for breast cancer -     Digital Screening Mammogram, Left and Right; Future  She is planning on contacting gastroenterology to schedule her colonoscopy as she knows that it is time.  Return in about 6 weeks (around 09/19/2023) for follow-up for mood (Prozac and Wellbutrin), start phentermine if ready.  Melida Quitter, PA

## 2023-08-08 NOTE — Patient Instructions (Signed)
I am so sorry for the mixup with Cymbalta, I have sent 90 day prescriptions with multiple refills so we don't have that issue again!

## 2023-08-08 NOTE — Assessment & Plan Note (Signed)
Patient is interested in both Wellbutrin and phentermine for weight management.  Starting with improving mood symptoms with Prozac and Wellbutrin.  Wellbutrin is also helpful for cravings, which she has noticed since discontinuing Wellbutrin.  If tolerating well and mood is stable at follow-up, start low-dose of phentermine.  Will continue to monitor.

## 2023-08-08 NOTE — Assessment & Plan Note (Signed)
PHQ-9 score 14, GAD-7 score 5.  Restart Prozac 10 mg daily and bupropion 100 mg daily.  Will follow-up in about 6 weeks to assess efficacy and adjust as needed.

## 2023-08-16 ENCOUNTER — Telehealth: Payer: Self-pay

## 2023-08-16 DIAGNOSIS — M7061 Trochanteric bursitis, right hip: Secondary | ICD-10-CM | POA: Diagnosis not present

## 2023-08-16 DIAGNOSIS — J3089 Other allergic rhinitis: Secondary | ICD-10-CM

## 2023-08-16 MED ORDER — FLUTICASONE PROPIONATE 50 MCG/ACT NA SUSP
NASAL | 0 refills | Status: DC
Start: 2023-08-16 — End: 2023-09-27

## 2023-08-16 NOTE — Telephone Encounter (Signed)
Prescription Request  08/16/2023  LOV: 08/08/23  What is the name of the medication or equipment? fluticasone (FLONASE) 50 MCG/ACT nasal spray   Have you contacted your pharmacy to request a refill? Yes   Which pharmacy would you like this sent to?  [facility] - [place], [place] - [address] [place] [address] Phone: [phone] Fax: [phone]    Patient notified that their request is being sent to the clinical staff for review and that they should receive a response within 2 business days.   Please advise at Mobile [phone] (mobile)

## 2023-08-16 NOTE — Telephone Encounter (Signed)
Refill sent to [facility].  ?

## 2023-09-13 DIAGNOSIS — M7061 Trochanteric bursitis, right hip: Secondary | ICD-10-CM | POA: Diagnosis not present

## 2023-09-26 ENCOUNTER — Telehealth: Payer: Self-pay

## 2023-09-26 DIAGNOSIS — J3089 Other allergic rhinitis: Secondary | ICD-10-CM

## 2023-09-26 NOTE — Telephone Encounter (Signed)
Prescription Request  09/26/2023  LOV: 08/08/23  What is the name of the medication or equipment? fluticasone (FLONASE) 50 MCG/ACT nasal spray   Have you contacted your pharmacy to request a refill? Yes   Which pharmacy would you like this sent to?  [facility] - [place], [place] - [address] [place] [address] Phone: [phone] Fax: [phone]    Patient notified that their request is being sent to the clinical staff for review and that they should receive a response within 2 business days.   Please advise at Mobile [phone] (mobile)  Please check in with the pharmacy pt states that the pharmacy has sent this request multiple times with no response

## 2023-09-27 ENCOUNTER — Ambulatory Visit: Payer: Federal, State, Local not specified - PPO | Admitting: Family Medicine

## 2023-09-27 MED ORDER — FLUTICASONE PROPIONATE 50 MCG/ACT NA SUSP: NASAL | 3 refills | Status: AC

## 2023-09-27 NOTE — Telephone Encounter (Signed)
Refill sent.

## 2023-10-03 ENCOUNTER — Ambulatory Visit: Payer: Federal, State, Local not specified - PPO

## 2023-10-23 ENCOUNTER — Ambulatory Visit: Payer: Federal, State, Local not specified - PPO | Admitting: Family Medicine

## 2023-10-23 ENCOUNTER — Encounter: Payer: Self-pay | Admitting: Family Medicine

## 2023-10-23 VITALS — BP 114/78 | HR 104 | Resp 18 | Ht 65.0 in | Wt 185.0 lb

## 2023-10-23 DIAGNOSIS — M25552 Pain in left hip: Secondary | ICD-10-CM | POA: Diagnosis not present

## 2023-10-23 DIAGNOSIS — F339 Major depressive disorder, recurrent, unspecified: Secondary | ICD-10-CM | POA: Diagnosis not present

## 2023-10-23 DIAGNOSIS — R110 Nausea: Secondary | ICD-10-CM | POA: Diagnosis not present

## 2023-10-23 DIAGNOSIS — Z1159 Encounter for screening for other viral diseases: Secondary | ICD-10-CM

## 2023-10-23 DIAGNOSIS — M7061 Trochanteric bursitis, right hip: Secondary | ICD-10-CM | POA: Insufficient documentation

## 2023-10-23 DIAGNOSIS — E663 Overweight: Secondary | ICD-10-CM | POA: Diagnosis not present

## 2023-10-23 DIAGNOSIS — M7062 Trochanteric bursitis, left hip: Secondary | ICD-10-CM | POA: Insufficient documentation

## 2023-10-23 DIAGNOSIS — G894 Chronic pain syndrome: Secondary | ICD-10-CM | POA: Insufficient documentation

## 2023-10-23 DIAGNOSIS — M533 Sacrococcygeal disorders, not elsewhere classified: Secondary | ICD-10-CM | POA: Insufficient documentation

## 2023-10-23 DIAGNOSIS — Z6828 Body mass index (BMI) 28.0-28.9, adult: Secondary | ICD-10-CM

## 2023-10-23 MED ORDER — FLUOXETINE HCL 20 MG PO CAPS: 20.0000 mg | ORAL_CAPSULE | Freq: Every day | ORAL | 3 refills | Status: DC

## 2023-10-23 MED ORDER — ONDANSETRON HCL 4 MG PO TABS: 4.0000 mg | ORAL_TABLET | Freq: Three times a day (TID) | ORAL | 0 refills | Status: AC | PRN

## 2023-10-23 MED ORDER — HYDROCODONE-ACETAMINOPHEN 7.5-325 MG PO TABS: 1.0000 | ORAL_TABLET | Freq: Two times a day (BID) | ORAL | 0 refills | Status: AC

## 2023-10-23 MED ORDER — MORPHINE SULFATE ER 15 MG PO TBCR: 15.0000 mg | EXTENDED_RELEASE_TABLET | Freq: Two times a day (BID) | ORAL | 0 refills | Status: AC

## 2023-10-23 NOTE — Assessment & Plan Note (Signed)
PHQ-9 score and GAD-7 for score improved.  Continue Prozac 20 mg daily.  Discontinue bupropion as it was still causing a sensation of something crawling on her skin.  Will continue to monitor.

## 2023-10-23 NOTE — Progress Notes (Signed)
Established Patient Office Visit  Subjective   Patient ID: Christine Frank, female    DOB: 25-May-1966  Age: 57 y.o. MRN: [id number]  Chief Complaint  Patient presents with   Anxiety   Depression    HPI Christine Frank is a 57 y.o. female presenting today for follow up of mood.  She is also requesting a few days of her controlled pain medications as a bridge until she establishes with [facility] for pain management on 10/27/2023.  She did receive prescriptions from her previous pain management provider on 10/15/2023 and 10/11/2023 for lower doses and less frequent dosing of her pain medication that she has been on for almost 10 years.  Given the lower doses, she is experiencing diarrhea, nausea, vomiting.  She is not able to eat very much, so she cannot take ibuprofen. Mood: Patient is here to follow up for depression and anxiety, currently managing with Prozac. Taking medication without side effects, reports excellent compliance with treatment. Denies mood changes or SI/HI. She feels mood is improved since last visit.  She did increase the dose to 20 mg as that was her previous dose and feels that it is working well.     10/23/2023    2:18 PM 08/08/2023    1:52 PM 03/20/2023    2:29 PM  Depression screen PHQ 2/9  Decreased Interest 1 2 1   Down, Depressed, Hopeless 1 1 1   PHQ - 2 Score 2 3 2   Altered sleeping 1 2 0  Tired, decreased energy 1 3 1   Change in appetite 1 1 1   Feeling bad or failure about yourself  1 1 1   Trouble concentrating 0 1 1  Moving slowly or fidgety/restless 0 0 0  Suicidal thoughts 0 0 0  PHQ-9 Score 6 11 6   Difficult doing work/chores Somewhat difficult Very difficult Somewhat difficult       10/23/2023    2:19 PM 08/08/2023    1:53 PM 03/20/2023    2:29 PM 02/06/2023    2:21 PM  GAD 7 : Generalized Anxiety Score  Nervous, Anxious, on Edge 0 1 0 0  Control/stop worrying 0 1 0 0  Worry too much - different things 0 1 0 0  Trouble relaxing 0  1 0 0  Restless 0 0 0 0  Easily annoyed or irritable 0 1 0 0  Afraid - awful might happen 0 0 0 0  Total GAD 7 Score 0 5 0 0  Anxiety Difficulty Not difficult at all Somewhat difficult Not difficult at all     Outpatient Medications Prior to Visit  Medication Sig Note   baclofen (LIORESAL) 10 MG tablet Take 1 tablet (10 mg total) by mouth 2 (two) times daily. Take 1/2 to 1 tablet 2 x day if needed for muscle spasm    BIOTIN PO 10,000 mcg oral once daily    diclofenac sodium (VOLTAREN) 1 % GEL Apply 1 application topically 3 (three) times daily as needed.    docusate sodium (COLACE) 100 MG capsule Take 100 mg by mouth 2 (two) times daily.    fluticasone (FLONASE) 50 MCG/ACT nasal spray INSTILL 1 SPRAY IN EACH NOSTRIL EVERY DAY    gabapentin (NEURONTIN) 300 MG capsule TAKE 1 CAPSULE BY MOUTH 3 TIMES DAILY    HYDROcodone-acetaminophen (NORCO) 10-325 MG tablet Take 1 tablet by mouth in the morning, at noon, and at bedtime.    ibuprofen (ADVIL) 600 MG tablet Take 1 tablet (600 mg total) by  mouth every 6 (six) hours as needed.    magnesium oxide (MAG-OX) 400 (240 Mg) MG tablet Take 400 mg by mouth daily.    morphine (MS CONTIN) 30 MG 12 hr tablet Take 1 tablet by mouth 2 (two) times daily.    Multiple Vitamin (MULTIVITAMIN) capsule Take 1 capsule by mouth daily.    rizatriptan (MAXALT) 10 MG tablet TAKE 1 TABLET BY MOUTH DAILY (MAY REPEAT IN TWO HOURS IF NEEDED. MAX TWO PER DAY)    SUMAtriptan Succinate (ZEMBRACE SYMTOUCH) 3 MG/0.5ML SOAJ Inject 3 mg into the skin every 1 hour as needed for migraine. Max 4 injections in one day.    VITAMIN D PO Take by mouth.    [DISCONTINUED] buPROPion ER (WELLBUTRIN SR) 100 MG 12 hr tablet Take 1 tablet (100 mg total) by mouth daily.    [DISCONTINUED] FLUoxetine (PROZAC) 10 MG tablet Take 1 tablet (10 mg total) by mouth daily.    [DISCONTINUED] HYDROcodone-acetaminophen (NORCO) 7.5-325 MG tablet 1 tablet 2 (two) times daily.    [DISCONTINUED] morphine (MS  CONTIN) 15 MG 12 hr tablet Take 15 mg by mouth every 12 (twelve) hours.    [DISCONTINUED] promethazine (PHENERGAN) 25 MG tablet Take 1 tablet (25 mg total) by mouth every 6 (six) hours as needed. 10/23/2023: somnolence   Cetirizine HCl 10 MG CAPS Take 1 capsule (10 mg total) by mouth daily for 10 days.    Facility-Administered Medications Prior to Visit  Medication Dose Route Frequency Provider   0.9 %  sodium chloride infusion  500 mL Intravenous Continuous Nandigam, Kavitha V, MD    ROS Negative unless otherwise noted in HPI   Objective:     BP 114/78 (BP Location: Left Arm, Patient Position: Sitting, Cuff Size: Normal)   Pulse (!) 104   Resp 18   Ht 5\' 5"  (1.651 m)   Wt 185 lb (83.9 kg)   SpO2 98%   BMI 30.79 kg/m   Physical Exam Constitutional:      General: She is not in acute distress.    Appearance: Normal appearance.  HENT:     Head: Normocephalic and atraumatic.  Pulmonary:     Effort: Pulmonary effort is normal. No respiratory distress.  Musculoskeletal:     Cervical back: Normal range of motion.  Neurological:     General: No focal deficit present.     Mental Status: She is alert and oriented to person, place, and time. Mental status is at baseline.  Psychiatric:        Mood and Affect: Mood normal.        Thought Content: Thought content normal.        Judgment: Judgment normal.     Assessment & Plan:  Recurrent major depressive disorder, remission status unspecified (HCC) Assessment & Plan: PHQ-9 score and GAD-7 for score improved.  Continue Prozac 20 mg daily.  Discontinue bupropion as it was still causing a sensation of something crawling on her skin.  Will continue to monitor.  Orders: -     FLUoxetine HCl; Take 1 capsule (20 mg total) by mouth daily.  Dispense: 90 capsule; Refill: 3  Nausea -     Ondansetron HCl; Take 1 tablet (4 mg total) by mouth every 8 (eight) hours as needed for nausea or vomiting.  Dispense: 20 tablet; Refill: 0  Pain in  left hip Assessment & Plan: PDMP reviewed, no aberrancies.  Overdose risk score 370.  She has been on the same pain medication regimen for many  years.  Provided #5 tablets hydrocodone-acetaminophen 7.5-325 mg and #5 tablets morphine 15 mg as bridge therapy.  Patient aware that PCP will not provide these medications long-term.  Also providing Zofran to manage nausea and allow use of ibuprofen for pain.  Orders: -     HYDROcodone-Acetaminophen; Take 1 tablet by mouth 2 (two) times daily.  Dispense: 5 tablet; Refill: 0 -     Morphine Sulfate ER; Take 1 tablet (15 mg total) by mouth every 12 (twelve) hours.  Dispense: 5 tablet; Refill: 0    Return in about 4 months (around 02/20/2024) for follow-up for mood.    Melida Quitter, PA

## 2023-10-23 NOTE — Patient Instructions (Signed)
You may take 500 mg of Tylenol scheduled every 6 hours as a baseline for pain for the next few days.  Once you are able to eat, you can also take ibuprofen WITH food.  I was able to check in your chart and send in just a few tablets of your current pain medications to bridge you until Saturday.

## 2023-10-23 NOTE — Assessment & Plan Note (Signed)
PDMP reviewed, no aberrancies.  Overdose risk score 370.  She has been on the same pain medication regimen for many years.  Provided #5 tablets hydrocodone-acetaminophen 7.5-325 mg and #5 tablets morphine 15 mg as bridge therapy.  Patient aware that PCP will not provide these medications long-term.  Also providing Zofran to manage nausea and allow use of ibuprofen for pain.

## 2023-10-27 DIAGNOSIS — E7800 Pure hypercholesterolemia, unspecified: Secondary | ICD-10-CM | POA: Diagnosis not present

## 2023-10-27 DIAGNOSIS — Z6830 Body mass index (BMI) 30.0-30.9, adult: Secondary | ICD-10-CM | POA: Diagnosis not present

## 2023-10-27 DIAGNOSIS — Z131 Encounter for screening for diabetes mellitus: Secondary | ICD-10-CM | POA: Diagnosis not present

## 2023-10-27 DIAGNOSIS — M25551 Pain in right hip: Secondary | ICD-10-CM | POA: Diagnosis not present

## 2023-10-27 DIAGNOSIS — E6609 Other obesity due to excess calories: Secondary | ICD-10-CM | POA: Diagnosis not present

## 2023-10-27 DIAGNOSIS — M25552 Pain in left hip: Secondary | ICD-10-CM | POA: Diagnosis not present

## 2023-10-27 DIAGNOSIS — Z1159 Encounter for screening for other viral diseases: Secondary | ICD-10-CM | POA: Diagnosis not present

## 2023-10-27 DIAGNOSIS — E559 Vitamin D deficiency, unspecified: Secondary | ICD-10-CM | POA: Diagnosis not present

## 2023-10-27 DIAGNOSIS — R5383 Other fatigue: Secondary | ICD-10-CM | POA: Diagnosis not present

## 2023-10-27 DIAGNOSIS — M549 Dorsalgia, unspecified: Secondary | ICD-10-CM | POA: Diagnosis not present

## 2023-10-27 DIAGNOSIS — M129 Arthropathy, unspecified: Secondary | ICD-10-CM | POA: Diagnosis not present

## 2023-10-27 DIAGNOSIS — D539 Nutritional anemia, unspecified: Secondary | ICD-10-CM | POA: Diagnosis not present

## 2023-10-27 DIAGNOSIS — Z79899 Other long term (current) drug therapy: Secondary | ICD-10-CM | POA: Diagnosis not present

## 2023-10-31 ENCOUNTER — Ambulatory Visit: Payer: Federal, State, Local not specified - PPO | Admitting: Family Medicine

## 2023-10-31 DIAGNOSIS — Z79899 Other long term (current) drug therapy: Secondary | ICD-10-CM | POA: Diagnosis not present

## 2023-11-02 DIAGNOSIS — M160 Bilateral primary osteoarthritis of hip: Secondary | ICD-10-CM | POA: Diagnosis not present

## 2023-11-02 DIAGNOSIS — Z79899 Other long term (current) drug therapy: Secondary | ICD-10-CM | POA: Diagnosis not present

## 2023-11-02 DIAGNOSIS — M4184 Other forms of scoliosis, thoracic region: Secondary | ICD-10-CM | POA: Diagnosis not present

## 2023-11-02 DIAGNOSIS — Z6830 Body mass index (BMI) 30.0-30.9, adult: Secondary | ICD-10-CM | POA: Diagnosis not present

## 2023-11-02 DIAGNOSIS — R0602 Shortness of breath: Secondary | ICD-10-CM | POA: Diagnosis not present

## 2023-11-02 DIAGNOSIS — E6609 Other obesity due to excess calories: Secondary | ICD-10-CM | POA: Diagnosis not present

## 2023-11-02 DIAGNOSIS — M4727 Other spondylosis with radiculopathy, lumbosacral region: Secondary | ICD-10-CM | POA: Diagnosis not present

## 2023-11-02 DIAGNOSIS — E781 Pure hyperglyceridemia: Secondary | ICD-10-CM | POA: Diagnosis not present

## 2023-11-07 ENCOUNTER — Ambulatory Visit: Payer: Federal, State, Local not specified - PPO

## 2023-11-07 DIAGNOSIS — Z79899 Other long term (current) drug therapy: Secondary | ICD-10-CM | POA: Diagnosis not present

## 2023-12-03 DIAGNOSIS — M4727 Other spondylosis with radiculopathy, lumbosacral region: Secondary | ICD-10-CM | POA: Diagnosis not present

## 2023-12-03 DIAGNOSIS — E6609 Other obesity due to excess calories: Secondary | ICD-10-CM | POA: Diagnosis not present

## 2023-12-03 DIAGNOSIS — M4184 Other forms of scoliosis, thoracic region: Secondary | ICD-10-CM | POA: Diagnosis not present

## 2023-12-03 DIAGNOSIS — Z6830 Body mass index (BMI) 30.0-30.9, adult: Secondary | ICD-10-CM | POA: Diagnosis not present

## 2023-12-03 DIAGNOSIS — M160 Bilateral primary osteoarthritis of hip: Secondary | ICD-10-CM | POA: Diagnosis not present

## 2023-12-03 DIAGNOSIS — Z79899 Other long term (current) drug therapy: Secondary | ICD-10-CM | POA: Diagnosis not present

## 2023-12-03 DIAGNOSIS — E781 Pure hyperglyceridemia: Secondary | ICD-10-CM | POA: Diagnosis not present

## 2023-12-05 ENCOUNTER — Other Ambulatory Visit: Payer: Federal, State, Local not specified - PPO

## 2023-12-06 DIAGNOSIS — Z79899 Other long term (current) drug therapy: Secondary | ICD-10-CM | POA: Diagnosis not present

## 2024-01-03 ENCOUNTER — Encounter: Payer: Self-pay | Admitting: Family Medicine

## 2024-01-03 DIAGNOSIS — Z6830 Body mass index (BMI) 30.0-30.9, adult: Secondary | ICD-10-CM | POA: Diagnosis not present

## 2024-01-03 DIAGNOSIS — E6609 Other obesity due to excess calories: Secondary | ICD-10-CM | POA: Diagnosis not present

## 2024-01-03 DIAGNOSIS — M4727 Other spondylosis with radiculopathy, lumbosacral region: Secondary | ICD-10-CM | POA: Diagnosis not present

## 2024-01-03 DIAGNOSIS — Z79899 Other long term (current) drug therapy: Secondary | ICD-10-CM | POA: Diagnosis not present

## 2024-01-03 DIAGNOSIS — E781 Pure hyperglyceridemia: Secondary | ICD-10-CM | POA: Diagnosis not present

## 2024-01-03 DIAGNOSIS — M4184 Other forms of scoliosis, thoracic region: Secondary | ICD-10-CM | POA: Diagnosis not present

## 2024-01-03 DIAGNOSIS — M160 Bilateral primary osteoarthritis of hip: Secondary | ICD-10-CM | POA: Diagnosis not present

## 2024-01-07 DIAGNOSIS — Z79899 Other long term (current) drug therapy: Secondary | ICD-10-CM | POA: Diagnosis not present

## 2024-02-01 DIAGNOSIS — M4184 Other forms of scoliosis, thoracic region: Secondary | ICD-10-CM | POA: Diagnosis not present

## 2024-02-01 DIAGNOSIS — M160 Bilateral primary osteoarthritis of hip: Secondary | ICD-10-CM | POA: Diagnosis not present

## 2024-02-01 DIAGNOSIS — E6609 Other obesity due to excess calories: Secondary | ICD-10-CM | POA: Diagnosis not present

## 2024-02-01 DIAGNOSIS — Z6830 Body mass index (BMI) 30.0-30.9, adult: Secondary | ICD-10-CM | POA: Diagnosis not present

## 2024-02-01 DIAGNOSIS — M4727 Other spondylosis with radiculopathy, lumbosacral region: Secondary | ICD-10-CM | POA: Diagnosis not present

## 2024-02-01 DIAGNOSIS — Z79899 Other long term (current) drug therapy: Secondary | ICD-10-CM | POA: Diagnosis not present

## 2024-02-01 DIAGNOSIS — E781 Pure hyperglyceridemia: Secondary | ICD-10-CM | POA: Diagnosis not present

## 2024-02-13 ENCOUNTER — Telehealth: Payer: Self-pay | Admitting: Neurology

## 2024-02-13 NOTE — Telephone Encounter (Signed)
 LVM and sent mychart msg informing pt of r/s needed for 8/7 appt- Maralyn Sago out.

## 2024-02-20 ENCOUNTER — Ambulatory Visit: Payer: Federal, State, Local not specified - PPO | Admitting: Family Medicine

## 2024-03-03 DIAGNOSIS — Z79899 Other long term (current) drug therapy: Secondary | ICD-10-CM | POA: Diagnosis not present

## 2024-03-03 DIAGNOSIS — M4727 Other spondylosis with radiculopathy, lumbosacral region: Secondary | ICD-10-CM | POA: Diagnosis not present

## 2024-03-03 DIAGNOSIS — M4184 Other forms of scoliosis, thoracic region: Secondary | ICD-10-CM | POA: Diagnosis not present

## 2024-03-03 DIAGNOSIS — M160 Bilateral primary osteoarthritis of hip: Secondary | ICD-10-CM | POA: Diagnosis not present

## 2024-03-03 DIAGNOSIS — E781 Pure hyperglyceridemia: Secondary | ICD-10-CM | POA: Diagnosis not present

## 2024-03-03 DIAGNOSIS — E6609 Other obesity due to excess calories: Secondary | ICD-10-CM | POA: Diagnosis not present

## 2024-03-03 DIAGNOSIS — Z6830 Body mass index (BMI) 30.0-30.9, adult: Secondary | ICD-10-CM | POA: Diagnosis not present

## 2024-04-02 DIAGNOSIS — Z79899 Other long term (current) drug therapy: Secondary | ICD-10-CM | POA: Diagnosis not present

## 2024-04-02 DIAGNOSIS — M4727 Other spondylosis with radiculopathy, lumbosacral region: Secondary | ICD-10-CM | POA: Diagnosis not present

## 2024-04-02 DIAGNOSIS — E6609 Other obesity due to excess calories: Secondary | ICD-10-CM | POA: Diagnosis not present

## 2024-04-02 DIAGNOSIS — Z6830 Body mass index (BMI) 30.0-30.9, adult: Secondary | ICD-10-CM | POA: Diagnosis not present

## 2024-04-02 DIAGNOSIS — M160 Bilateral primary osteoarthritis of hip: Secondary | ICD-10-CM | POA: Diagnosis not present

## 2024-04-04 DIAGNOSIS — Z79899 Other long term (current) drug therapy: Secondary | ICD-10-CM | POA: Diagnosis not present

## 2024-04-30 DIAGNOSIS — E781 Pure hyperglyceridemia: Secondary | ICD-10-CM | POA: Diagnosis not present

## 2024-04-30 DIAGNOSIS — Z79899 Other long term (current) drug therapy: Secondary | ICD-10-CM | POA: Diagnosis not present

## 2024-04-30 DIAGNOSIS — M4184 Other forms of scoliosis, thoracic region: Secondary | ICD-10-CM | POA: Diagnosis not present

## 2024-04-30 DIAGNOSIS — E6609 Other obesity due to excess calories: Secondary | ICD-10-CM | POA: Diagnosis not present

## 2024-04-30 DIAGNOSIS — Z6830 Body mass index (BMI) 30.0-30.9, adult: Secondary | ICD-10-CM | POA: Diagnosis not present

## 2024-04-30 DIAGNOSIS — M4727 Other spondylosis with radiculopathy, lumbosacral region: Secondary | ICD-10-CM | POA: Diagnosis not present

## 2024-04-30 DIAGNOSIS — M1612 Unilateral primary osteoarthritis, left hip: Secondary | ICD-10-CM | POA: Diagnosis not present

## 2024-05-28 DIAGNOSIS — M1612 Unilateral primary osteoarthritis, left hip: Secondary | ICD-10-CM | POA: Diagnosis not present

## 2024-05-28 DIAGNOSIS — M4184 Other forms of scoliosis, thoracic region: Secondary | ICD-10-CM | POA: Diagnosis not present

## 2024-05-28 DIAGNOSIS — Z6830 Body mass index (BMI) 30.0-30.9, adult: Secondary | ICD-10-CM | POA: Diagnosis not present

## 2024-05-28 DIAGNOSIS — E781 Pure hyperglyceridemia: Secondary | ICD-10-CM | POA: Diagnosis not present

## 2024-05-28 DIAGNOSIS — Z79899 Other long term (current) drug therapy: Secondary | ICD-10-CM | POA: Diagnosis not present

## 2024-05-28 DIAGNOSIS — E6609 Other obesity due to excess calories: Secondary | ICD-10-CM | POA: Diagnosis not present

## 2024-05-28 DIAGNOSIS — M4727 Other spondylosis with radiculopathy, lumbosacral region: Secondary | ICD-10-CM | POA: Diagnosis not present

## 2024-06-28 DIAGNOSIS — E6609 Other obesity due to excess calories: Secondary | ICD-10-CM | POA: Diagnosis not present

## 2024-06-28 DIAGNOSIS — M4184 Other forms of scoliosis, thoracic region: Secondary | ICD-10-CM | POA: Diagnosis not present

## 2024-06-28 DIAGNOSIS — Z79899 Other long term (current) drug therapy: Secondary | ICD-10-CM | POA: Diagnosis not present

## 2024-06-28 DIAGNOSIS — E781 Pure hyperglyceridemia: Secondary | ICD-10-CM | POA: Diagnosis not present

## 2024-06-28 DIAGNOSIS — Z6830 Body mass index (BMI) 30.0-30.9, adult: Secondary | ICD-10-CM | POA: Diagnosis not present

## 2024-06-28 DIAGNOSIS — M4727 Other spondylosis with radiculopathy, lumbosacral region: Secondary | ICD-10-CM | POA: Diagnosis not present

## 2024-06-28 DIAGNOSIS — M1612 Unilateral primary osteoarthritis, left hip: Secondary | ICD-10-CM | POA: Diagnosis not present

## 2024-07-03 ENCOUNTER — Ambulatory Visit: Payer: Federal, State, Local not specified - PPO | Admitting: Neurology

## 2024-07-23 ENCOUNTER — Other Ambulatory Visit: Payer: Self-pay

## 2024-07-23 DIAGNOSIS — R51 Headache with orthostatic component, not elsewhere classified: Secondary | ICD-10-CM

## 2024-07-23 MED ORDER — GABAPENTIN 300 MG PO CAPS: ORAL_CAPSULE | ORAL | 3 refills | Status: DC

## 2024-07-23 MED ORDER — RIZATRIPTAN BENZOATE 10 MG PO TABS: ORAL_TABLET | ORAL | 11 refills | Status: DC

## 2024-07-30 DIAGNOSIS — M4727 Other spondylosis with radiculopathy, lumbosacral region: Secondary | ICD-10-CM | POA: Diagnosis not present

## 2024-07-30 DIAGNOSIS — M4184 Other forms of scoliosis, thoracic region: Secondary | ICD-10-CM | POA: Diagnosis not present

## 2024-07-30 DIAGNOSIS — Z6830 Body mass index (BMI) 30.0-30.9, adult: Secondary | ICD-10-CM | POA: Diagnosis not present

## 2024-07-30 DIAGNOSIS — E6609 Other obesity due to excess calories: Secondary | ICD-10-CM | POA: Diagnosis not present

## 2024-07-30 DIAGNOSIS — Z79899 Other long term (current) drug therapy: Secondary | ICD-10-CM | POA: Diagnosis not present

## 2024-07-30 DIAGNOSIS — M1612 Unilateral primary osteoarthritis, left hip: Secondary | ICD-10-CM | POA: Diagnosis not present

## 2024-07-31 DIAGNOSIS — M1612 Unilateral primary osteoarthritis, left hip: Secondary | ICD-10-CM | POA: Diagnosis not present

## 2024-07-31 DIAGNOSIS — M25552 Pain in left hip: Secondary | ICD-10-CM | POA: Diagnosis not present

## 2024-08-05 ENCOUNTER — Other Ambulatory Visit: Payer: Self-pay | Admitting: Family Medicine

## 2024-08-05 DIAGNOSIS — Z1231 Encounter for screening mammogram for malignant neoplasm of breast: Secondary | ICD-10-CM

## 2024-08-05 DIAGNOSIS — Z79899 Other long term (current) drug therapy: Secondary | ICD-10-CM | POA: Diagnosis not present

## 2024-08-06 ENCOUNTER — Ambulatory Visit

## 2024-08-11 ENCOUNTER — Ambulatory Visit

## 2024-08-25 ENCOUNTER — Ambulatory Visit: Payer: Self-pay

## 2024-08-28 DIAGNOSIS — M4184 Other forms of scoliosis, thoracic region: Secondary | ICD-10-CM | POA: Diagnosis not present

## 2024-08-28 DIAGNOSIS — M1612 Unilateral primary osteoarthritis, left hip: Secondary | ICD-10-CM | POA: Diagnosis not present

## 2024-08-28 DIAGNOSIS — Z6830 Body mass index (BMI) 30.0-30.9, adult: Secondary | ICD-10-CM | POA: Diagnosis not present

## 2024-08-28 DIAGNOSIS — E6609 Other obesity due to excess calories: Secondary | ICD-10-CM | POA: Diagnosis not present

## 2024-08-28 DIAGNOSIS — E781 Pure hyperglyceridemia: Secondary | ICD-10-CM | POA: Diagnosis not present

## 2024-08-28 DIAGNOSIS — Z79899 Other long term (current) drug therapy: Secondary | ICD-10-CM | POA: Diagnosis not present

## 2024-08-28 DIAGNOSIS — M4727 Other spondylosis with radiculopathy, lumbosacral region: Secondary | ICD-10-CM | POA: Diagnosis not present

## 2024-09-26 DIAGNOSIS — Z6831 Body mass index (BMI) 31.0-31.9, adult: Secondary | ICD-10-CM | POA: Diagnosis not present

## 2024-09-26 DIAGNOSIS — M4727 Other spondylosis with radiculopathy, lumbosacral region: Secondary | ICD-10-CM | POA: Diagnosis not present

## 2024-09-26 DIAGNOSIS — M4184 Other forms of scoliosis, thoracic region: Secondary | ICD-10-CM | POA: Diagnosis not present

## 2024-09-26 DIAGNOSIS — E6609 Other obesity due to excess calories: Secondary | ICD-10-CM | POA: Diagnosis not present

## 2024-09-26 DIAGNOSIS — M1612 Unilateral primary osteoarthritis, left hip: Secondary | ICD-10-CM | POA: Diagnosis not present

## 2024-09-26 DIAGNOSIS — Z79899 Other long term (current) drug therapy: Secondary | ICD-10-CM | POA: Diagnosis not present

## 2024-09-29 ENCOUNTER — Encounter: Payer: Self-pay | Admitting: Radiology

## 2024-10-20 ENCOUNTER — Encounter: Payer: Self-pay | Admitting: Family

## 2024-10-20 ENCOUNTER — Ambulatory Visit: Admitting: Family

## 2024-10-20 ENCOUNTER — Ambulatory Visit: Payer: Self-pay

## 2024-10-20 VITALS — BP 110/73 | HR 94 | Temp 98.6°F | Resp 16 | Ht 65.0 in | Wt 178.8 lb

## 2024-10-20 DIAGNOSIS — B354 Tinea corporis: Secondary | ICD-10-CM

## 2024-10-20 MED ORDER — TERBINAFINE HCL 250 MG PO TABS: 250.0000 mg | ORAL_TABLET | Freq: Every day | ORAL | 0 refills | Status: AC

## 2024-10-20 MED ORDER — CETIRIZINE HCL 10 MG PO TABS: 10.0000 mg | ORAL_TABLET | Freq: Every day | ORAL | 0 refills | Status: AC

## 2024-10-20 NOTE — Telephone Encounter (Signed)
 FYI Only or Action Required?: FYI only for provider: appointment scheduled on thismorning.  Patient was last seen in primary care on 10/23/2023 by Wallace Joesph LABOR, PA.  Called Nurse Triage reporting Rash.  Symptoms began several days ago.  Interventions attempted: Prescription medications: antifungal.  Symptoms are: unchanged.  Triage Disposition: See Physician Within 24 Hours  Patient/caregiver understands and will follow disposition?: Yes                     Copied from CRM #[id number]. Topic: Clinical - Red Word Triage >> Oct 20, 2024  8:31 AM Rosaria BRAVO wrote: Red Word that prompted transfer to Nurse Triage: Worsening ring worm, no appt soon enough Reason for Disposition  SEVERE itching (i.e., interferes with sleep, normal activities or school)  Answer Assessment - Initial Assessment Questions 1. APPEARANCE of RASH: What does the rash look like? (e.g., blisters, dry flaky skin, red spots, redness, sores)     Pt thinks this is ringworm 2. SIZE: How big are the spots? (e.g., tip of pen, eraser, coin; inches, centimeters)     Toes to hips, also on arms and tip of nose 3. LOCATION: Where is the rash located?     Toes to hips, also on arms and tip of nose 5. ONSET: When did the rash begin?     After taking in kittens  7. ITCHING: Does the rash itch? If Yes, ask: How bad is the itch? (Scale 1-10; or mild, moderate, severe)     yes 8. CAUSE: What do you think is causing the rash?     Ring worm  9. MEDICINE FACTORS: Have you started any new medicines within the last 2 weeks? (e.g., antibiotics)      no  Protocols used: Rash or Redness - [facility]

## 2024-10-20 NOTE — Progress Notes (Signed)
 Patient ID: Christine Frank, female    DOB: 1966-07-15  MRN: [id number]  CC: Rash  Subjective: Christine Frank is a 58 y.o. female who presents for rash.   Her concerns today include:  States she caught ringworm from her two kittens. States she noticed rash of bilateral upper/lower extremities and nose two days ago. States she feels ringworm may be beginning to start on her scalp. States itching. Denies red flag symptoms. States she is using over-the-counter antifungal and Benadryl with minimal relief.  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 Take 1/2 to 1 tablet 2 x day if needed for muscle spasm 60 tablet 0   BIOTIN PO 10,000 mcg oral once daily     Cetirizine  HCl 10 MG CAPS Take 1 capsule (10 mg total) by mouth daily for 10 days. 10 capsule 0   diclofenac sodium (VOLTAREN) 1 % GEL Apply 1 application topically 3 (three) times daily as needed.  1   docusate sodium (COLACE) 100 MG capsule Take 100 mg by mouth 2 (two) times daily.     FLUoxetine  (PROZAC ) 20 MG capsule Take 1 capsule (20 mg total) by mouth  daily. 90 capsule 3   fluticasone  (FLONASE ) 50 MCG/ACT nasal spray INSTILL 1 SPRAY IN EACH NOSTRIL EVERY DAY 16 g 3   gabapentin  (NEURONTIN ) 300 MG capsule TAKE 1 CAPSULE BY MOUTH 3 TIMES DAILY 270 capsule 3   HYDROcodone -acetaminophen  (NORCO) 10-325 MG tablet Take 1 tablet by mouth in the morning, at noon, and at bedtime.     HYDROcodone -acetaminophen  (NORCO) 7.5-325 MG tablet Take 1 tablet by mouth 2 (two) times daily. 5 tablet 0   ibuprofen  (ADVIL ) 600 MG tablet Take 1 tablet (600 mg total) by mouth every 6 (six) hours as needed. 30 tablet 0   magnesium oxide (MAG-OX) 400 (240 Mg) MG tablet Take 400 mg by mouth daily.     morphine  (MS CONTIN ) 15 MG 12 hr tablet Take 1 tablet (15 mg total) by mouth every 12 (twelve) hours. 5 tablet 0   morphine  (MS CONTIN ) 30 MG 12 hr tablet Take 1 tablet by mouth 2 (two) times daily.     Multiple Vitamin (MULTIVITAMIN) capsule Take 1 capsule by mouth daily.     ondansetron  (ZOFRAN ) 4 MG tablet Take 1 tablet (4 mg total) by mouth every 8 (eight) hours as needed for nausea or vomiting. 20 tablet 0   rizatriptan  (MAXALT ) 10 MG tablet TAKE 1 TABLET BY MOUTH DAILY (MAY REPEAT IN TWO HOURS IF NEEDED. MAX TWO PER DAY) 10 tablet 11   SUMAtriptan  Succinate (ZEMBRACE SYMTOUCH ) 3 MG/0.5ML SOAJ  Inject 3 mg into the skin every 1 hour as needed for migraine. Max 4 injections in one day. 6 mL 10   VITAMIN D  PO Take by mouth.     Current Facility-Administered Medications on File Prior to Visit  Medication Dose Route Frequency Provider Last Rate Last Admin   0.9 %  sodium chloride  infusion  500 mL Intravenous Continuous Nandigam, Kavitha V, MD        Allergies  Allergen Reactions   Sulfa Antibiotics Swelling and Rash    Social History   Socioeconomic History   Marital status: Widowed    Spouse name: Not on file   Number of children: 6   Years of education: Not on file   Highest education level: Bachelor's degree (e.g., BA, AB, BS)  Occupational History   Not on  file  Tobacco Use   Smoking status: Never    Passive exposure: Never   Smokeless tobacco: Never   Tobacco comments:    tried tobacco a few times as a teen, never picked up the habit  Vaping Use   Vaping status: Never Used  Substance and Sexual Activity   Alcohol use: Not Currently    Comment: Rare; 2-3 times per year socially   Drug use: No   Sexual activity: Not Currently    Comment: 1st intercourse 26 yo-5 partners  Other Topics Concern   Not on file  Social History Narrative   Lives at home with her 2 sons. Has 6 children total, 4 from previous marriage. Husband passed away 1 year ago.   Right handed   Daily less than one cup of coffee & three 8-12 oz cups of decaffeinated soda daily   Social Drivers of Corporate Investment Banker Strain: Not on file  Food Insecurity: Not on file  Transportation Needs: Not on file  Physical Activity: Not on file  Stress: Not on file  Social Connections: Not on file  Intimate Partner Violence: Not on file    Family History  Problem Relation Age of Onset   Hypertension Father    Diabetes Father    Hyperlipidemia Father    Alcohol abuse Maternal Grandmother    Alcohol abuse Maternal Grandfather    Heart attack Paternal Grandfather    Cerebral aneurysm Mother    Cerebral aneurysm Maternal Aunt    Migraines Maternal Aunt    Colon cancer Neg Hx    Esophageal cancer Neg Hx    Rectal cancer Neg Hx    Stomach cancer Neg Hx     Past Surgical History:  Procedure Laterality Date   CERVICAL CERCLAGE     CESAREAN SECTION     x 2   CESAREAN SECTION  1994   CESAREAN SECTION  1999   KNEE SURGERY Right 11/27/2019   torn meniscus and torn cartilage repaired    ROS: Review of Systems Negative except as stated above  PHYSICAL EXAM: BP 110/73   Pulse 94   Temp 98.6 F (37 C) (Oral)   Resp 16   Ht 5' 5 (1.651 m)   Wt 178 lb 12.8 oz (81.1 kg)   SpO2 94%   BMI 29.75 kg/m   Physical Exam HENT:     Head: Normocephalic and  atraumatic.     Comments: Scalp unremarkable.    Nose: Nose normal.     Mouth/Throat:     Mouth: Mucous membranes are moist.     Pharynx: Oropharynx is clear.  Eyes:     Extraocular  Movements: Extraocular movements intact.     Conjunctiva/sclera: Conjunctivae normal.     Pupils: Pupils are equal, round, and reactive to light.  Cardiovascular:     Rate and Rhythm: Normal rate and regular rhythm.     Pulses: Normal pulses.     Heart sounds: Normal heart sounds.  Pulmonary:     Effort: Pulmonary effort is normal.     Breath sounds: Normal breath sounds.  Musculoskeletal:        General: Normal range of motion.     Cervical back: Normal range of motion and neck supple.  Skin:    General: Skin is warm and dry.     Findings: Rash present.     Comments: Erythematous rash appears as ringworm of generalized body, no drainage.  Neurological:     General: No focal deficit present.     Mental Status: She is alert and oriented to person, place, and time.  Psychiatric:        Mood and Affect: Mood normal.        Behavior: Behavior normal.     ASSESSMENT AND PLAN: Note - I discussed plan of care with supervising physician Raguel Blush, MD.   1. Tinea corporis (Primary) - Terbinafine  and Cetirizine  as prescribed. Counseled on medication adherence/adverse effects. - Follow-up with primary provider as scheduled.  - terbinafine  (LAMISIL ) 250 MG tablet; Take 1 tablet (250 mg total) by mouth daily.  Dispense: 14 tablet; Refill: 0 - cetirizine  (ZYRTEC ) 10 MG tablet; Take 1 tablet (10 mg total) by mouth daily.  Dispense: 90 tablet; Refill: 0   Patient was given the opportunity to ask questions.  Patient verbalized understanding of the plan and was able to repeat key elements of the plan. Patient was given clear instructions to go to Emergency Department or return to medical center if symptoms don't improve, worsen, or new problems develop.The patient verbalized understanding.   Requested  Prescriptions   Signed Prescriptions Disp Refills   terbinafine  (LAMISIL ) 250 MG tablet 14 tablet 0    Sig: Take 1 tablet (250 mg total) by mouth daily.   cetirizine  (ZYRTEC ) 10 MG tablet 90 tablet 0    Sig: Take 1 tablet (10 mg total) by mouth daily.    Return for Follow-Up or next available with Joesph Sear, PA.  Greig JINNY Chute, NP

## 2024-10-20 NOTE — Progress Notes (Signed)
 Patient has a ringworm

## 2024-10-27 DIAGNOSIS — Z6829 Body mass index (BMI) 29.0-29.9, adult: Secondary | ICD-10-CM | POA: Diagnosis not present

## 2024-10-27 DIAGNOSIS — M4727 Other spondylosis with radiculopathy, lumbosacral region: Secondary | ICD-10-CM | POA: Diagnosis not present

## 2024-10-27 DIAGNOSIS — Z79899 Other long term (current) drug therapy: Secondary | ICD-10-CM | POA: Diagnosis not present

## 2024-10-27 DIAGNOSIS — E781 Pure hyperglyceridemia: Secondary | ICD-10-CM | POA: Diagnosis not present

## 2024-10-27 DIAGNOSIS — M1612 Unilateral primary osteoarthritis, left hip: Secondary | ICD-10-CM | POA: Diagnosis not present

## 2024-10-27 DIAGNOSIS — M4184 Other forms of scoliosis, thoracic region: Secondary | ICD-10-CM | POA: Diagnosis not present

## 2024-10-30 ENCOUNTER — Telehealth: Payer: Self-pay | Admitting: Neurology

## 2024-10-30 NOTE — Telephone Encounter (Signed)
 Lvm that we could offer appt today at 4:15 with slack np as verbally instructed

## 2024-10-30 NOTE — Telephone Encounter (Signed)
 LVM and sent MyChart msg informing pt of appt change to [facility]- office closing.

## 2024-10-31 ENCOUNTER — Telehealth: Admitting: Neurology

## 2024-10-31 DIAGNOSIS — R51 Headache with orthostatic component, not elsewhere classified: Secondary | ICD-10-CM

## 2024-10-31 DIAGNOSIS — Z79899 Other long term (current) drug therapy: Secondary | ICD-10-CM | POA: Diagnosis not present

## 2024-10-31 DIAGNOSIS — G43709 Chronic migraine without aura, not intractable, without status migrainosus: Secondary | ICD-10-CM

## 2024-10-31 MED ORDER — GABAPENTIN 300 MG PO CAPS: ORAL_CAPSULE | ORAL | 3 refills | Status: AC

## 2024-10-31 MED ORDER — ZEMBRACE SYMTOUCH 3 MG/0.5ML ~~LOC~~ SOAJ: SUBCUTANEOUS | 10 refills | Status: AC

## 2024-10-31 MED ORDER — RIZATRIPTAN BENZOATE 10 MG PO TABS: ORAL_TABLET | ORAL | 11 refills | Status: AC

## 2024-10-31 NOTE — Progress Notes (Signed)
 Patient: Christine Frank Date of Birth: 11/02/1966  Reason for Visit: Follow up History from: Patient Primary Neurologist: [facility]   Virtual Visit via Video Note  I connected with Christine Frank on 10/31/24 at 11:00 AM EST by a video enabled telemedicine application and verified that I am speaking with the correct person using two identifiers.  Location: Patient: at her home Provider: in my home office    I discussed the limitations of evaluation and management by telemedicine and the availability of in person appointments. The patient expressed understanding and agreed to proceed.  ASSESSMENT AND PLAN 58 y.o. year old female   1.  Chronic migraine headaches -Stable, doing overall well, happy with current medication regimen -Continue gabapentin  300 mg 3 times daily for migraine preventative -Continue Maxalt  for acute migraine headache, Zembrace for severe migraine -Follow up in 1 year [facility]  Meds ordered this encounter  Medications   gabapentin  (NEURONTIN ) 300 MG capsule    Sig: TAKE 1 CAPSULE BY MOUTH 3 TIMES DAILY    Dispense:  270 capsule    Refill:  3   rizatriptan  (MAXALT ) 10 MG tablet    Sig: TAKE 1 TABLET BY MOUTH DAILY (MAY REPEAT IN TWO HOURS IF NEEDED. MAX TWO PER DAY)    Dispense:  10 tablet    Refill:  11   SUMAtriptan  Succinate (ZEMBRACE SYMTOUCH ) 3 MG/0.5ML SOAJ    Sig: Inject 3 mg into the skin every 1 hour as needed for migraine. Max 4 injections in one day.    Dispense:  6 mL    Refill:  10    Cancel previous Rx. Prescription clarification. Please setup patient for savings program. Medications tried: Rizatriptan , Fiorinal .   HISTORY OF PRESENT ILLNESS: Today 10/31/24 10/31/24 SS: Here [facility], few more headaches with weather change. Headaches don't often progress to migraine due to current medications if she takes early. Remains on gabapentin  300 mg TID. Takes Maxalt  about 6-8 times a month, rarely needs nasal spray. Continues to work for  hospice. Pleased with current regimen.   07/04/23 SS: Here for follow-up.  Last seen October 2022.  At the time taking gabapentin  for migraine preventative, Maxalt  or Zembrace as needed. Remains on gabapentin  300 mg TID. Migraines under really good control, none this month, last month, woke up with 1, then takes Zembrace with excellent benefit. For milder headache will take Maxalt . Takes Maxalt  5-6 times a month, to prevent milder headache from , developing into migraine. Takes phenergan  PRN, takes rarely. Chronic hip pain, on morphine . Works as [employer].   HISTORY  Dr. Ines: Interval history September 14, 2021: Patient is here for follow-up of migraines, saw her about a year ago when she was doing well on gabapentin , Maxalt  was working well acutely for her, we did discuss other medications to try if needed.  I review of her chart does not show any recent migraine activity or ED visits for migraines headaches.  We did order Zembrace for headaches that she wakes up in the morning, injectable sumatriptan . She takes rizatriptan  occ. She has used the zembrace only 2x in last year. The gabapentin  helps. She works from home. She is happy with her management, we discussed other options, continue as is.   REVIEW OF SYSTEMS: Out of a complete 14 system review of symptoms, the patient complains only of the following symptoms, and all other reviewed systems are negative.  See HPI  ALLERGIES: Allergies  Allergen Reactions   Sulfa Antibiotics Swelling and Rash  HOME MEDICATIONS: Outpatient Medications Prior to Visit  Medication Sig Dispense Refill   baclofen  (LIORESAL ) 10 MG tablet Take 1 tablet (10 mg total) by mouth 2 (two) times daily. Take 1/2 to 1 tablet 2 x day if needed for muscle spasm 60 tablet 0   BIOTIN PO 10,000 mcg oral once daily     cetirizine  (ZYRTEC ) 10 MG tablet Take 1 tablet (10 mg total) by mouth daily. 90 tablet 0   Cetirizine  HCl 10 MG CAPS Take 1 capsule (10 mg total) by mouth daily  for 10 days. 10 capsule 0   diclofenac sodium (VOLTAREN) 1 % GEL Apply 1 application topically 3 (three) times daily as needed.  1   docusate sodium (COLACE) 100 MG capsule Take 100 mg by mouth 2 (two) times daily.     FLUoxetine  (PROZAC ) 20 MG capsule Take 1 capsule (20 mg total) by mouth daily. 90 capsule 3   fluticasone  (FLONASE ) 50 MCG/ACT nasal spray INSTILL 1 SPRAY IN EACH NOSTRIL EVERY DAY 16 g 3   HYDROcodone -acetaminophen  (NORCO) 10-325 MG tablet Take 1 tablet by mouth in the morning, at noon, and at bedtime.     HYDROcodone -acetaminophen  (NORCO) 7.5-325 MG tablet Take 1 tablet by mouth 2 (two) times daily. 5 tablet 0   ibuprofen  (ADVIL ) 600 MG tablet Take 1 tablet (600 mg total) by mouth every 6 (six) hours as needed. 30 tablet 0   magnesium oxide (MAG-OX) 400 (240 Mg) MG tablet Take 400 mg by mouth daily.     morphine  (MS CONTIN ) 15 MG 12 hr tablet Take 1 tablet (15 mg total) by mouth every 12 (twelve) hours. 5 tablet 0   morphine  (MS CONTIN ) 30 MG 12 hr tablet Take 1 tablet by mouth 2 (two) times daily.     Multiple Vitamin (MULTIVITAMIN) capsule Take 1 capsule by mouth daily.     ondansetron  (ZOFRAN ) 4 MG tablet Take 1 tablet (4 mg total) by mouth every 8 (eight) hours as needed for nausea or vomiting. 20 tablet 0   terbinafine  (LAMISIL ) 250 MG tablet Take 1 tablet (250 mg total) by mouth daily. 14 tablet 0   VITAMIN D  PO Take by mouth.     gabapentin  (NEURONTIN ) 300 MG capsule TAKE 1 CAPSULE BY MOUTH 3 TIMES DAILY 270 capsule 3   rizatriptan  (MAXALT ) 10 MG tablet TAKE 1 TABLET BY MOUTH DAILY (MAY REPEAT IN TWO HOURS IF NEEDED. MAX TWO PER DAY) 10 tablet 11   SUMAtriptan  Succinate (ZEMBRACE SYMTOUCH ) 3 MG/0.5ML SOAJ Inject 3 mg into the skin every 1 hour as needed for migraine. Max 4 injections in one day. 6 mL 10   Facility-Administered Medications Prior to Visit  Medication Dose Route Frequency Provider Last Rate Last Admin   0.9 %  sodium chloride  infusion  500 mL Intravenous  Continuous Nandigam, Kavitha V, MD        PAST MEDICAL HISTORY: Past Medical History:  Diagnosis Date   Allergy    Anemia    Chronic hip pain    Chronic leg pain    bilateral   Depression    Migraine    PMDD (premenstrual dysphoric disorder)    Restless leg    S/P epidural steroid injection    affects legs is seen at pain clinic.    PAST SURGICAL HISTORY: Past Surgical History:  Procedure Laterality Date   CERVICAL CERCLAGE     CESAREAN SECTION     x 2   CESAREAN SECTION  1994  CESAREAN SECTION  1999   KNEE SURGERY Right 11/27/2019   torn meniscus and torn cartilage repaired    FAMILY HISTORY: Family History  Problem Relation Age of Onset   Hypertension Father    Diabetes Father    Hyperlipidemia Father    Alcohol abuse Maternal Grandmother    Alcohol abuse Maternal Grandfather    Heart attack Paternal Grandfather    Cerebral aneurysm Mother    Cerebral aneurysm Maternal Aunt    Migraines Maternal Aunt    Colon cancer Neg Hx    Esophageal cancer Neg Hx    Rectal cancer Neg Hx    Stomach cancer Neg Hx     SOCIAL HISTORY: Social History   Socioeconomic History   Marital status: Widowed    Spouse name: Not on file   Number of children: 6   Years of education: Not on file   Highest education level: Bachelor's degree (e.g., BA, AB, BS)  Occupational History   Not on file  Tobacco Use   Smoking status: Never    Passive exposure: Never   Smokeless tobacco: Never   Tobacco comments:    tried tobacco a few times as a teen, never picked up the habit  Vaping Use   Vaping status: Never Used  Substance and Sexual Activity   Alcohol use: Not Currently    Comment: Rare; 2-3 times per year socially   Drug use: No   Sexual activity: Not Currently    Comment: 1st intercourse 53 yo-5 partners  Other Topics Concern   Not on file  Social History Narrative   Lives at home with her 2 sons. Has 6 children total, 4 from previous marriage. Husband passed away 1  year ago.   Right handed   Daily less than one cup of coffee & three 8-12 oz cups of decaffeinated soda daily   Social Drivers of Corporate Investment Banker Strain: Not on file  Food Insecurity: Not on file  Transportation Needs: Not on file  Physical Activity: Not on file  Stress: Not on file  Social Connections: Not on file  Intimate Partner Violence: Not on file    PHYSICAL EXAM  There were no vitals filed for this visit.  There is no height or weight on file to calculate BMI.  Virtual Visit  DIAGNOSTIC DATA (LABS, IMAGING, TESTING) - I reviewed patient records, labs, notes, testing and imaging myself where available.  Lab Results  Component Value Date   WBC 6.2 03/07/2023   HGB 12.2 03/07/2023   HCT 35.2 03/07/2023   MCV 89 03/07/2023   PLT 436 03/07/2023      Component Value Date/Time   NA 140 03/07/2023 0922   K 4.4 03/07/2023 0922   CL 102 03/07/2023 0922   CO2 24 03/07/2023 0922   GLUCOSE 100 (H) 03/07/2023 0922   GLUCOSE 161 (H) 06/16/2018 1512   BUN 17 03/07/2023 0922   CREATININE 0.79 03/07/2023 0922   CREATININE 0.65 08/05/2015 1230   CALCIUM 9.2 03/07/2023 0922   PROT 6.9 03/07/2023 0922   ALBUMIN 4.2 03/07/2023 0922   AST 15 03/07/2023 0922   ALT 12 03/07/2023 0922   ALKPHOS 91 03/07/2023 0922   BILITOT 0.4 03/07/2023 0922   GFRNONAA 95 09/06/2020 0840   GFRNONAA >89 08/05/2015 1230   GFRAA 110 09/06/2020 0840   GFRAA >89 08/05/2015 1230   Lab Results  Component Value Date   CHOL 193 03/07/2023   HDL 60 03/07/2023   LDLCALC  110 (H) 03/07/2023   TRIG 131 03/07/2023   CHOLHDL 3.2 03/07/2023   Lab Results  Component Value Date   HGBA1C 5.6 03/07/2023   Lab Results  Component Value Date   VITAMINB12 654 04/22/2015   Lab Results  Component Value Date   TSH 1.480 03/07/2023    Lauraine Born, AGNP-C, DNP 10/31/2024, 11:14 AM [facility] [address], [address] [place], [place] [address] [phone]

## 2024-10-31 NOTE — Patient Instructions (Signed)
 Great to see you today! Continue current medications Reach out if you need anything! Follow-up in 1 year.  Thanks!!

## 2024-11-03 ENCOUNTER — Encounter: Payer: Self-pay | Admitting: Family

## 2024-11-03 NOTE — Telephone Encounter (Signed)
 Report to Emergency Department/Urgent Care/call 911 for immediate medical evaluation. Follow-up with established primary provider Joesph Sear, PA.

## 2024-11-19 ENCOUNTER — Encounter: Payer: Self-pay | Admitting: *Deleted

## 2024-11-19 ENCOUNTER — Ambulatory Visit: Admission: EM | Admit: 2024-11-19 | Discharge: 2024-11-19 | Disposition: A | Discharge status: Home / Self Care

## 2024-11-19 DIAGNOSIS — B350 Tinea barbae and tinea capitis: Secondary | ICD-10-CM

## 2024-11-19 DIAGNOSIS — J0110 Acute frontal sinusitis, unspecified: Secondary | ICD-10-CM | POA: Diagnosis not present

## 2024-11-19 MED ORDER — TERBINAFINE HCL 1 % EX CREA: 1.0000 | TOPICAL_CREAM | Freq: Two times a day (BID) | CUTANEOUS | 0 refills | Status: AC

## 2024-11-19 MED ORDER — AMOXICILLIN-POT CLAVULANATE 875-125 MG PO TABS: 1.0000 | ORAL_TABLET | Freq: Two times a day (BID) | ORAL | 0 refills | Status: AC

## 2024-11-19 MED ORDER — FLUCONAZOLE 150 MG PO TABS: 150.0000 mg | ORAL_TABLET | Freq: Every day | ORAL | 0 refills | Status: AC

## 2024-11-19 MED ORDER — PREDNISONE 20 MG PO TABS: 40.0000 mg | ORAL_TABLET | Freq: Every day | ORAL | 0 refills | Status: AC

## 2024-11-19 NOTE — ED Provider Notes (Addendum)
 " [facility]    CSN: [id number] Arrival date & time: 11/19/24  1251      History   Chief Complaint Chief Complaint  Patient presents with   Facial Swelling    HPI Christine Frank is a 58 y.o. female.   58 year old female who presents urgent care with complaints of sinus pressure, sinus congestion, sinus headaches, right forehead swelling over the right eye.  This has been progressively worsening over the last 2 to 3 weeks, she has tried on multiple different over-the-counter medications without much relief at all.  She reports that the sinus pressure has increased drastically.  She denies any fevers that she has noticed.  She also relates that she had taken in 2 kittens recently and has them adopted out now but reports that she did develop ringworm from this.  Her primary care provider had put her on Lamisil  for 2 weeks but she continues to have some areas and 1 area that she is concerned about is on her scalp that just developed recently.    Past Medical History:  Diagnosis Date   Allergy    Anemia    Chronic hip pain    Chronic leg pain    bilateral   Depression    Migraine    PMDD (premenstrual dysphoric disorder)    Restless leg    S/P epidural steroid injection    affects legs is seen at pain clinic.    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   Chronic migraine without aura without status migrainosus, not intractable 03/26/2019   Dysuria 07/18/2018   Depression 05/16/2018   Grief 05/16/2018   Vitamin D  deficiency 11/08/2017   Menstrual migraine with status migrainosus, not intractable 07/05/2017   Back pain of lumbar region  with sciatica 07/05/2017   Varicose veins of bilateral lower extremities with other complications 03/31/2013    Past Surgical History:  Procedure Laterality Date   CERVICAL CERCLAGE     CESAREAN SECTION     x 2   CESAREAN SECTION  1994   CESAREAN SECTION  1999   KNEE SURGERY Right 11/27/2019   torn meniscus and torn cartilage repaired    OB History     Gravida  10   Para  2   Term  2   Preterm      AB  8   Living  2      SAB  8   IAB      Ectopic      Multiple      Live Births               Home Medications    Prior to Admission medications  Medication Sig Start Date End Date Taking? Authorizing Provider  amoxicillin -clavulanate (AUGMENTIN ) 875-125 MG tablet Take 1 tablet by mouth every 12 (twelve) hours for 10 days. 11/19/24 11/29/24 Yes Robi Mitter A, PA-C  BIOTIN PO 10,000 mcg oral once daily   Yes [provider]  cetirizine  (ZYRTEC ) 10 MG tablet Take 1 tablet (10 mg total) by mouth daily. 10/20/24  Yes Massey, Amy J, NP  diclofenac sodium (VOLTAREN) 1 % GEL Apply 1 application topically 3 (  three) times daily as needed. 08/30/17  Yes [provider]  docusate sodium (COLACE) 100 MG capsule Take 100 mg by mouth 2 (two) times daily.   Yes [provider]  FLUoxetine  (PROZAC ) 20 MG capsule Take 1 capsule (20 mg total) by mouth daily. 10/23/23  Yes Wallace Search A, PA  fluticasone  (FLONASE ) 50 MCG/ACT nasal spray INSTILL 1 SPRAY IN EACH NOSTRIL EVERY DAY 09/27/23  Yes Wallace Search A, PA  gabapentin  (NEURONTIN ) 300 MG capsule TAKE 1 CAPSULE BY MOUTH 3 TIMES DAILY 10/31/24  Yes Gayland Lauraine PARAS, NP  ibuprofen  (ADVIL ) 600 MG tablet Take 1 tablet (600 mg total) by mouth every 6 (six) hours as needed. 03/27/21  Yes Wieters, Hallie C, PA-C  magnesium oxide (MAG-OX) 400 (240 Mg) MG tablet Take 400 mg by mouth daily.   Yes [provider]  morphine  (MS CONTIN ) 15 MG 12 hr tablet Take 1 tablet (15 mg total) by mouth every  12 (twelve) hours. 10/23/23  Yes Wallace Search A, PA  Multiple Vitamin (MULTIVITAMIN) capsule Take 1 capsule by mouth daily.   Yes [provider]  naloxone [facility]) nasal spray 4 mg/0.1 mL SMARTSIG:Both Nares 10/27/24  Yes [provider]  predniSONE  (DELTASONE ) 20 MG tablet Take 2 tablets (40 mg total) by mouth daily with breakfast for 5 days. 11/19/24 11/24/24 Yes Miroslava Santellan A, PA-C  rizatriptan  (MAXALT ) 10 MG tablet TAKE 1 TABLET BY MOUTH DAILY (MAY REPEAT IN TWO HOURS IF NEEDED. MAX TWO PER DAY) 10/31/24  Yes Gayland Lauraine PARAS, NP  SUMAtriptan  Succinate (ZEMBRACE SYMTOUCH ) 3 MG/0.5ML SOAJ Inject 3 mg into the skin every 1 hour as needed for migraine. Max 4 injections in one day. 10/31/24  Yes Gayland Lauraine PARAS, NP  terbinafine  (LAMISIL ) 1 % cream Apply 1 Application topically 2 (two) times daily. 11/19/24  Yes Duru Reiger A, PA-C  tiZANidine (ZANAFLEX) 2 MG tablet Take 2 mg by mouth 3 (three) times daily.   Yes [provider]  VITAMIN D  PO Take by mouth.   Yes [provider]  baclofen  (LIORESAL ) 10 MG tablet Take 1 tablet (10 mg total) by mouth 2 (two) times daily. Take 1/2 to 1 tablet 2 x day if needed for muscle spasm Patient not taking: Reported on 11/19/2024 10/14/15   Craig Alan SAUNDERS, PA-C  Cetirizine  HCl 10 MG CAPS Take 1 capsule (10 mg total) by mouth daily for 10 days. 03/27/21 10/20/24  Wieters, Hallie C, PA-C  HYDROcodone -acetaminophen  (NORCO) 10-325 MG tablet Take 1 tablet by mouth in the morning, at noon, and at bedtime. 11/30/16   [provider]  HYDROcodone -acetaminophen  (NORCO) 7.5-325 MG tablet Take 1 tablet by mouth 2 (two) times daily. 10/23/23   Wallace Search LABOR, PA  morphine  (MS CONTIN ) 30 MG 12 hr tablet Take 1 tablet by mouth 2 (two) times daily. 12/12/18   [provider]  ondansetron  (ZOFRAN ) 4 MG tablet Take 1 tablet (4 mg total) by mouth every 8 (eight) hours as needed for nausea or vomiting. 10/23/23   Wallace Search LABOR, PA  terbinafine  (LAMISIL ) 250 MG tablet Take 1 tablet (250 mg total) by mouth daily. Patient not taking: Reported on 11/19/2024 10/20/24   Jaycee Greig PARAS, NP    Family History Family History  Problem Relation Age of Onset   Hypertension Father    Diabetes Father    Hyperlipidemia Father    Alcohol abuse Maternal Grandmother    Alcohol abuse Maternal Grandfather    Heart attack  Paternal Grandfather    Cerebral aneurysm Mother    Cerebral aneurysm Maternal Aunt    Migraines Maternal Aunt    Colon cancer Neg Hx    Esophageal cancer Neg Hx    Rectal cancer Neg Hx    Stomach cancer Neg Hx     Social History Social History[1]   Allergies   Sulfa antibiotics   Review of Systems Review of Systems  Constitutional:  Negative for chills and fever.  HENT:  Positive for congestion, facial swelling, sinus pressure and sinus pain. Negative for ear pain and sore throat.   Eyes:  Negative for pain and visual disturbance.  Respiratory:  Negative for cough and shortness of breath.   Cardiovascular:  Negative for chest pain and palpitations.  Gastrointestinal:  Negative for abdominal pain and vomiting.  Genitourinary:  Negative for dysuria and hematuria.  Musculoskeletal:  Negative for arthralgias and back pain.  Skin:  Negative for color change and rash.  Neurological:  Positive for headaches. Negative for seizures and syncope.  All other systems reviewed and are negative.    Physical Exam Triage Vital Signs ED Triage Vitals  Encounter Vitals Group     BP 11/19/24 1401 131/84     Girls Systolic BP Percentile --      Girls Diastolic BP Percentile --      Boys Systolic BP Percentile --      Boys Diastolic BP Percentile --      Pulse Rate 11/19/24 1401 83     Resp 11/19/24 1401 18     Temp 11/19/24 1401 98.2 F (36.8 C)     Temp Source 11/19/24 1401 Oral     SpO2 11/19/24 1401 95 %     Weight --      Height --      Head Circumference --      Peak Flow  --      Pain Score 11/19/24 1357 8     Pain Loc --      Pain Education --      Exclude from Growth Chart --    No data found.  Updated Vital Signs BP 131/84 (BP Location: Left Arm)   Pulse 83   Temp 98.2 F (36.8 C) (Oral)   Resp 18   SpO2 95%   Visual Acuity Right Eye Distance:   Left Eye Distance:   Bilateral Distance:    Right Eye Near:   Left Eye Near:    Bilateral Near:     Physical Exam Vitals and nursing note reviewed.  Constitutional:      General: She is not in acute distress.    Appearance: She is well-developed.  HENT:     Head: Normocephalic and atraumatic.      Nose: Nasal tenderness, mucosal edema, congestion and rhinorrhea present.     Right Sinus: Frontal sinus tenderness present.     Left Sinus: Frontal sinus tenderness present.     Comments: Swelling around the right frontal region Eyes:     Conjunctiva/sclera: Conjunctivae normal.  Cardiovascular:     Rate and Rhythm: Normal rate and regular rhythm.     Heart sounds: No murmur heard. Pulmonary:     Effort: Pulmonary effort is normal. No respiratory distress.     Breath sounds: Normal breath sounds.  Abdominal:     Palpations: Abdomen is soft.     Tenderness: There is no abdominal tenderness.  Musculoskeletal:        General: No swelling.  Cervical back: Neck supple.  Skin:    General: Skin is warm and dry.     Capillary Refill: Capillary refill takes less than 2 seconds.  Neurological:     Mental Status: She is alert.  Psychiatric:        Mood and Affect: Mood normal.      UC Treatments / Results  Labs (all labs ordered are listed, but only abnormal results are displayed) Labs Reviewed - No data to display  EKG   Radiology No results found.  Procedures Procedures (including critical care time)  Medications Ordered in UC Medications - No data to display  Initial Impression / Assessment and Plan / UC Course  I have reviewed the triage vital signs and the nursing  notes.  Pertinent labs & imaging results that were available during my care of the patient were reviewed by me and considered in my medical decision making (see chart for details).     Acute non-recurrent frontal sinusitis  Scalp ringworm   Symptoms and physical exam findings are most consistent with a severe acute frontal sinusitis including some facial swelling.  Due to the severity and duration of symptoms we will treat with antibiotics by mouth as well as a course of steroids to reduce the swelling and inflammation.  For the area of concern on the scalp, we will prescribe terbinafine  1 application twice daily until resolved and recommend that you contact your primary care provider next week to discuss potentially prescribing Lamisil  by mouth for a longer duration of time.  Augmentin  875 mg twice daily for 10 days.  This is an antibiotic.  Take this with food.  Prednisone  40 mg (2 tablets) once daily for 5 days. Take this in the morning.  This is a steroid to help with inflammation and pain.  Do not take ibuprofen  while you are taking this medication.  You may take Tylenol . Make sure to stay hydrated by drinking plenty of water. Terbinafine  cream twice daily to the area on the scalp until follow-up with primary care provider Return to urgent care or PCP if symptoms worsen or fail to resolve.   Final Clinical Impressions(s) / UC Diagnoses   Final diagnoses:  Acute non-recurrent frontal sinusitis  Scalp ringworm     Discharge Instructions      Symptoms and physical exam findings are most consistent with a severe acute frontal sinusitis including some facial swelling.  Due to the severity and duration of symptoms we will treat with antibiotics by mouth as well as a course of steroids to reduce the swelling and inflammation.  For the area of concern on the scalp, we will prescribe terbinafine  1 application twice daily until resolved and recommend that you contact your primary care provider  next week to discuss potentially prescribing Lamisil  by mouth for a longer duration of time.  Augmentin  875 mg twice daily for 10 days.  This is an antibiotic.  Take this with food.  Prednisone  40 mg (2 tablets) once daily for 5 days. Take this in the morning.  This is a steroid to help with inflammation and pain.  Do not take ibuprofen  while you are taking this medication.  You may take Tylenol . Make sure to stay hydrated by drinking plenty of water. Terbinafine  cream twice daily to the area on the scalp until follow-up with primary care provider Return to urgent care or PCP if symptoms worsen or fail to resolve.       ED Prescriptions  Medication Sig Dispense Auth. Provider   terbinafine  (LAMISIL ) 1 % cream Apply 1 Application topically 2 (two) times daily. 30 g Teresa Norris A, PA-C   amoxicillin -clavulanate (AUGMENTIN ) 875-125 MG tablet Take 1 tablet by mouth every 12 (twelve) hours for 10 days. 20 tablet Cobey Raineri A, PA-C   predniSONE  (DELTASONE ) 20 MG tablet Take 2 tablets (40 mg total) by mouth daily with breakfast for 5 days. 10 tablet Teresa Norris LABOR, [place]      PDMP not reviewed this encounter.    Teresa Norris LABOR [place] 11/19/24 1439      [1] Social History Tobacco Use   Smoking status: Never    Passive exposure: Never   Smokeless tobacco: Never   Tobacco comments:    tried tobacco a few times as a teen, never picked up the habit  Vaping Use   Vaping status: Never Used  Substance Use Topics   Alcohol use: Not Currently    Comment: Rare; 2-3 times per year socially   Drug use: No    Teresa Norris LABOR, PA-C 11/19/24 1440  "

## 2024-11-19 NOTE — ED Triage Notes (Addendum)
 Pt reports sinus issues for the last couple of weeks- States she has sinus issues. Taking sudafed, tylenol , and advil . She woke up yesterday with right side of her face swollen and is worse today

## 2024-11-19 NOTE — Discharge Instructions (Addendum)
 Symptoms and physical exam findings are most consistent with a severe acute frontal sinusitis including some facial swelling.  Due to the severity and duration of symptoms we will treat with antibiotics by mouth as well as a course of steroids to reduce the swelling and inflammation.  For the area of concern on the scalp, we will prescribe terbinafine  1 application twice daily until resolved and recommend that you contact your primary care provider next week to discuss potentially prescribing Lamisil  by mouth for a longer duration of time.  Augmentin  875 mg twice daily for 10 days.  This is an antibiotic.  Take this with food.  Prednisone  40 mg (2 tablets) once daily for 5 days. Take this in the morning.  This is a steroid to help with inflammation and pain.  Do not take ibuprofen  while you are taking this medication.  You may take Tylenol . Make sure to stay hydrated by drinking plenty of water. Terbinafine  cream twice daily to the area on the scalp until follow-up with primary care provider Return to urgent care or PCP if symptoms worsen or fail to resolve.

## 2024-11-26 ENCOUNTER — Other Ambulatory Visit: Payer: Self-pay | Admitting: *Deleted

## 2024-11-26 DIAGNOSIS — Z79899 Other long term (current) drug therapy: Secondary | ICD-10-CM | POA: Diagnosis not present

## 2024-11-26 DIAGNOSIS — M1612 Unilateral primary osteoarthritis, left hip: Secondary | ICD-10-CM | POA: Diagnosis not present

## 2024-11-26 DIAGNOSIS — E781 Pure hyperglyceridemia: Secondary | ICD-10-CM | POA: Diagnosis not present

## 2024-11-26 DIAGNOSIS — F339 Major depressive disorder, recurrent, unspecified: Secondary | ICD-10-CM

## 2024-11-26 DIAGNOSIS — R5383 Other fatigue: Secondary | ICD-10-CM | POA: Diagnosis not present

## 2024-11-26 DIAGNOSIS — M4727 Other spondylosis with radiculopathy, lumbosacral region: Secondary | ICD-10-CM | POA: Diagnosis not present

## 2024-11-26 DIAGNOSIS — M4184 Other forms of scoliosis, thoracic region: Secondary | ICD-10-CM | POA: Diagnosis not present

## 2024-11-26 DIAGNOSIS — Z131 Encounter for screening for diabetes mellitus: Secondary | ICD-10-CM | POA: Diagnosis not present

## 2024-11-26 MED ORDER — FLUOXETINE HCL 20 MG PO CAPS: 20.0000 mg | ORAL_CAPSULE | Freq: Every day | ORAL | 0 refills | Status: DC

## 2024-12-08 ENCOUNTER — Telehealth: Admitting: Emergency Medicine

## 2024-12-08 ENCOUNTER — Ambulatory Visit: Payer: Self-pay

## 2024-12-08 DIAGNOSIS — J111 Influenza due to unidentified influenza virus with other respiratory manifestations: Secondary | ICD-10-CM | POA: Diagnosis not present

## 2024-12-08 DIAGNOSIS — B349 Viral infection, unspecified: Secondary | ICD-10-CM

## 2024-12-08 MED ORDER — OSELTAMIVIR PHOSPHATE 75 MG PO CAPS: 75.0000 mg | ORAL_CAPSULE | Freq: Two times a day (BID) | ORAL | 0 refills | Status: AC

## 2024-12-08 NOTE — Telephone Encounter (Signed)
 FYI Only or Action Required?: FYI only for provider: appointment scheduled on today- virtual urgent care and TOC from [facility] to [place] scheduled 01/20/25.  Patient was last seen in primary care on 10/20/2024 by Jaycee Greig PARAS, NP.  Called Nurse Triage reporting Influenza and New Med Request.  Symptoms began 2 days ago.  Interventions attempted: Rest, hydration, or home remedies.  Symptoms are: gradually worsening.  Triage Disposition: See Physician Within 4 Hours (or PCP Triage) (overriding Call PCP Within 24 Hours)  Patient/caregiver understands and will follow disposition?: Yes   Reason for Disposition  [1] Patient is NOT HIGH RISK AND [2] strongly requests antiviral medicine AND [3] flu symptoms present < 48 hours  Answer Assessment - Initial Assessment Questions Additional info: Patient called in to request tamiflu  for symptomatic flu exposure. Patients pcp has left the clinic and patient has not had a TOC appointment. Next available TOC with selected provider is scheduled on 01/20/25. Virtual urgent care scheduled today to evaluate flu symptoms and to request tamiflu .    1. SYMPTOMS: What is your main symptom or concern? (e.g., cough, fever, shortness of breath, muscle aches)     Cough and congestion 2. ONSET: When did the symptoms start?      12/06/24 night 3. COUGH: Do you have a cough? If Yes, ask: How bad is the cough?       Yes productive of yellow  4. FEVER: Do you have a fever? If Yes, ask: What is your temperature, how was it measured, and when did it start?     Felt feverish on Saturday, using Tylenol  daily.  5. BREATHING DIFFICULTY: Are you having any difficulty breathing? (e.g., normal; shortness of breath, wheezing, unable to speak)      Denies  6. BETTER-SAME-WORSE: Are you getting better, staying the same or getting worse compared to yesterday?  If getting worse, ask, In what way?     worse 7. OTHER SYMPTOMS: Do you have any other symptoms?   (e.g., chills, fatigue, headache, loss of smell or taste, muscle pain, sore throat)     Sore throat, body aches  8. INFLUENZA EXPOSURE: Was there any known exposure to influenza (flu) before the symptoms began?     Yes 9. INFLUENZA SUSPECTED: Why do you think you have influenza? (e.g., positive flu self-test at home, symptoms after exposure).     Father is positive for flu A 10. INFLUENZA VACCINE: Have you had the flu vaccine? If Yes, ask: When did you last get it?        11. HIGH RISK FOR COMPLICATIONS: Do you have any chronic medical problems? (e.g., asthma, heart or lung disease, obesity, weak immune system)  Protocols used: Influenza (Flu) Suspected-A-AH  Copied from CRM [id number]. Topic: Clinical - Medication Question >> Dec 08, 2024 12:55 PM Myrick T wrote: Reason for CRM: patient called stated her father has been hospitalized due to the Flu A diagnosis and she has been by his side every day. Patient says she is now experiencing symptom such as cough, congestion, nausea, fever and headache. Patient is requesting a script for Tami-Flu

## 2024-12-08 NOTE — Progress Notes (Signed)
 " Virtual Visit Consent   Christine Frank, you are scheduled for a virtual visit with a [facility] provider today. Just as with appointments in the office, your consent must be obtained to participate. Your consent will be active for this visit and any virtual visit you may have with one of our providers in the next 365 days. If you have a MyChart account, a copy of this consent can be sent to you electronically.  As this is a virtual visit, video technology does not allow for your provider to perform a traditional examination. This may limit your provider's ability to fully assess your condition. If your provider identifies any concerns that need to be evaluated in person or the need to arrange testing (such as labs, EKG, etc.), we will make arrangements to do so. Although advances in technology are sophisticated, we cannot ensure that it will always work on either your end or our end. If the connection with a video visit is poor, the visit may have to be switched to a telephone visit. With either a video or telephone visit, we are not always able to ensure that we have a secure connection.  By engaging in this virtual visit, you consent to the provision of healthcare and authorize for your insurance to be billed (if applicable) for the services provided during this visit. Depending on your insurance coverage, you may receive a charge related to this service.  I need to obtain your verbal consent now. Are you willing to proceed with your visit today? Christine Frank has provided verbal consent on 12/08/2024 for a virtual visit (video or telephone). Jon CHRISTELLA Belt, NP  Date: 12/08/2024 4:20 PM   Virtual Visit via Video Note   I, Jon CHRISTELLA Belt, connected with  Christine Frank  ([id number], [birth year]) on 12/08/2024 at  4:15 PM EST by a video-enabled telemedicine application and verified that I am speaking with the correct person using two identifiers.  Location: Patient: Virtual Visit  Location Patient: Home Provider: Virtual Visit Location Provider: Home Office   I discussed the limitations of evaluation and management by telemedicine and the availability of in person appointments. The patient expressed understanding and agreed to proceed.    History of Present Illness: Christine Frank is a 59 y.o. who identifies as a female who was assigned female at birth, and is being seen today for the flu.   Body aches, sore throat, headache (better now that congestion is starting to drain), cough for yellow sputum, congestion. Felt had a fever, did not take temp, has had chills. Taking tylenol , some sinus medicine.   Is able to stay hydrated. Is not SOB.   Father is in hospital and she has been with him a lot, he is positive for flu A.   She requests tamiflu .    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  Resting comfortably  at home.  Head is normocephalic, atraumatic.  No labored breathing.  Speech is clear and coherent with logical content.  Patient is alert and oriented at baseline.    Assessment and  Plan: 1. Viral infection (Primary) - oseltamivir  (TAMIFLU ) 75 MG capsule; Take 1 capsule (75 mg total) by mouth 2 (two) times daily.  Dispense: 10 capsule; Refill: 0  2. Influenza-like illness - oseltamivir  (TAMIFLU ) 75 MG capsule; Take 1 capsule (75 mg total) by mouth 2 (two) times daily.  Dispense: 10 capsule; Refill: 0  Continue home care with tylenol  and OTC medicines as needed to control her sx.   Follow Up Instructions: I discussed the assessment and treatment plan with the patient. The patient was provided an opportunity to ask questions and all were answered. The patient agreed with the plan and demonstrated an understanding of the instructions.  A copy of instructions were sent to the patient via MyChart unless otherwise noted below.   The patient was advised to call back or seek an in-person evaluation if the symptoms worsen or if the condition fails to improve as anticipated.    Jon CHRISTELLA Belt, NP    [1]  Allergies Allergen Reactions   Sulfa Antibiotics Swelling and Rash  [2]  Current Outpatient Medications:    oseltamivir  (TAMIFLU ) 75 MG capsule, Take 1 capsule (75 mg total) by mouth 2 (two) times daily., Disp: 10 capsule, Rfl: 0   baclofen  (LIORESAL ) 10 MG tablet, Take 1 tablet (10 mg total) by mouth 2 (two) times daily. Take 1/2 to 1 tablet 2 x day if needed for muscle spasm (Patient not taking: Reported on 11/19/2024), Disp: 60 tablet, Rfl: 0   BIOTIN PO, 10,000 mcg oral once daily, Disp: , Rfl:    cetirizine  (ZYRTEC ) 10 MG tablet, Take 1 tablet (10 mg total) by mouth daily., Disp: 90 tablet, Rfl: 0   Cetirizine  HCl 10 MG CAPS, Take 1 capsule (10 mg total) by mouth daily for 10 days., Disp: 10 capsule, Rfl: 0   diclofenac sodium (VOLTAREN) 1 % GEL, Apply 1 application topically 3 (three) times daily as needed., Disp: , Rfl: 1   docusate sodium (COLACE) 100 MG capsule, Take 100 mg by mouth 2 (two) times daily., Disp: , Rfl:    FLUoxetine  (PROZAC ) 20 MG capsule, Take 1  capsule (20 mg total) by mouth daily., Disp: 30 capsule, Rfl: 0   fluticasone  (FLONASE ) 50 MCG/ACT nasal spray, INSTILL 1 SPRAY IN EACH NOSTRIL EVERY DAY, Disp: 16 g, Rfl: 3   gabapentin  (NEURONTIN ) 300 MG capsule, TAKE 1 CAPSULE BY MOUTH 3 TIMES DAILY, Disp: 270 capsule, Rfl: 3   HYDROcodone -acetaminophen  (NORCO) 10-325 MG tablet, Take 1 tablet by mouth in the morning, at noon, and at bedtime., Disp: , Rfl:    HYDROcodone -acetaminophen  (NORCO) 7.5-325 MG tablet, Take 1 tablet by mouth 2 (two) times daily., Disp: 5 tablet, Rfl: 0   ibuprofen  (ADVIL ) 600 MG tablet, Take 1 tablet (600 mg total) by mouth every 6 (six) hours as needed., Disp: 30 tablet, Rfl: 0   magnesium oxide (MAG-OX) 400 (240 Mg) MG tablet, Take 400 mg by mouth daily., Disp: , Rfl:    morphine  (MS CONTIN ) 15 MG 12 hr tablet, Take 1 tablet (15 mg total) by mouth every 12 (twelve) hours., Disp: 5  tablet, Rfl: 0   morphine  (MS CONTIN ) 30 MG 12 hr tablet, Take 1 tablet by mouth 2 (two) times daily., Disp: , Rfl:    Multiple Vitamin (MULTIVITAMIN) capsule, Take 1 capsule by mouth daily., Disp: , Rfl:    naloxone (NARCAN) nasal spray 4 mg/0.1 mL, SMARTSIG:Both Nares, Disp: , Rfl:    ondansetron  (ZOFRAN ) 4 MG tablet, Take 1 tablet (4 mg total) by mouth every 8 (eight) hours as needed for nausea or vomiting., Disp: 20 tablet, Rfl: 0   rizatriptan  (MAXALT ) 10 MG tablet, TAKE 1 TABLET BY MOUTH DAILY (MAY REPEAT IN TWO HOURS IF NEEDED. MAX TWO PER DAY), Disp: 10 tablet, Rfl: 11   SUMAtriptan  Succinate (ZEMBRACE SYMTOUCH ) 3 MG/0.5ML SOAJ, Inject 3 mg into the skin every 1 hour as needed for migraine. Max 4 injections in one day., Disp: 6 mL, Rfl: 10   terbinafine  (LAMISIL ) 1 % cream, Apply 1 Application topically 2 (two) times daily., Disp: 30 g, Rfl: 0   terbinafine  (LAMISIL ) 250 MG tablet, Take 1 tablet (250 mg total) by mouth daily. (Patient not taking: Reported on 11/19/2024), Disp: 14 tablet, Rfl: 0   tiZANidine (ZANAFLEX) 2 MG tablet, Take  2 mg by mouth 3 (three) times daily., Disp: , Rfl:    VITAMIN D  PO, Take by mouth., Disp: , Rfl:   Current Facility-Administered Medications:    0.9 %  sodium chloride  infusion, 500 mL, Intravenous, Continuous, Nandigam, Kavitha V, MD  "

## 2024-12-08 NOTE — Patient Instructions (Signed)
 " Christine Frank, thank you for joining Christine CHRISTELLA Belt, NP for today's virtual visit.  While this provider is not your primary care provider (PCP), if your PCP is located in our provider database this encounter information will be shared with them immediately following your visit.   A [facility] MyChart account gives you access to today's visit and all your visits, tests, and labs performed at [facility]  click here if you don't have a [facility] MyChart account or go to [email or web address]  Consent: (Patient) Christine Frank provided verbal consent for this virtual visit at the beginning of the encounter.  Current Medications:  Current Outpatient Medications:    oseltamivir  (TAMIFLU ) 75 MG capsule, Take 1 capsule (75 mg total) by mouth 2 (two) times daily., Disp: 10 capsule, Rfl: 0   baclofen  (LIORESAL ) 10 MG tablet, Take 1 tablet (10 mg total) by mouth 2 (two) times daily. Take 1/2 to 1 tablet 2 x day if needed for muscle spasm (Patient not taking: Reported on 11/19/2024), Disp: 60 tablet, Rfl: 0   BIOTIN PO, 10,000 mcg oral once daily, Disp: , Rfl:    cetirizine  (ZYRTEC ) 10 MG tablet, Take 1 tablet (10 mg total) by mouth daily., Disp: 90 tablet, Rfl: 0   Cetirizine  HCl 10 MG CAPS, Take 1 capsule (10 mg total) by mouth daily for 10 days., Disp: 10 capsule, Rfl: 0   diclofenac sodium (VOLTAREN) 1 % GEL, Apply 1 application topically 3 (three) times daily as needed., Disp: , Rfl: 1   docusate sodium (COLACE) 100 MG capsule, Take 100 mg by mouth 2 (two) times daily., Disp: , Rfl:    FLUoxetine  (PROZAC ) 20 MG capsule, Take 1 capsule (20 mg total) by mouth daily., Disp: 30 capsule, Rfl: 0   fluticasone  (FLONASE ) 50 MCG/ACT nasal spray, INSTILL 1 SPRAY IN EACH NOSTRIL EVERY DAY, Disp: 16 g, Rfl: 3   gabapentin  (NEURONTIN ) 300 MG capsule, TAKE 1 CAPSULE BY MOUTH 3 TIMES DAILY, Disp: 270 capsule, Rfl: 3   HYDROcodone -acetaminophen  (NORCO) 10-325 MG tablet, Take 1 tablet  by mouth in the morning, at noon, and at bedtime., Disp: , Rfl:    HYDROcodone -acetaminophen  (NORCO) 7.5-325 MG tablet, Take 1 tablet by mouth 2 (two) times daily., Disp: 5 tablet, Rfl: 0   ibuprofen  (ADVIL ) 600 MG tablet, Take 1 tablet (600 mg total) by mouth every 6 (six) hours as needed., Disp: 30 tablet, Rfl: 0   magnesium oxide (MAG-OX) 400 (240 Mg) MG tablet, Take 400 mg by mouth daily., Disp: , Rfl:    morphine  (MS CONTIN ) 15 MG 12 hr tablet, Take 1 tablet (15 mg total) by mouth every 12 (twelve) hours., Disp: 5 tablet, Rfl: 0   morphine  (MS CONTIN ) 30 MG 12 hr tablet, Take 1 tablet by mouth 2 (two) times daily., Disp: , Rfl:    Multiple Vitamin (MULTIVITAMIN) capsule, Take 1 capsule by mouth daily., Disp: , Rfl:    naloxone (NARCAN) nasal spray 4 mg/0.1 mL, SMARTSIG:Both Nares, Disp: , Rfl:    ondansetron  (ZOFRAN ) 4 MG tablet, Take 1 tablet (4 mg total) by mouth every 8 (eight) hours as needed for nausea or vomiting., Disp: 20 tablet, Rfl: 0   rizatriptan  (MAXALT ) 10 MG tablet, TAKE 1 TABLET BY MOUTH DAILY (MAY REPEAT IN TWO HOURS IF NEEDED. MAX TWO PER DAY), Disp: 10 tablet, Rfl: 11   SUMAtriptan  Succinate (ZEMBRACE SYMTOUCH ) 3 MG/0.5ML SOAJ, Inject 3 mg into the skin every 1 hour as needed for migraine. Max  4 injections in one day., Disp: 6 mL, Rfl: 10   terbinafine  (LAMISIL ) 1 % cream, Apply 1 Application topically 2 (two) times daily., Disp: 30 g, Rfl: 0   terbinafine  (LAMISIL ) 250 MG tablet, Take 1 tablet (250 mg total) by mouth daily. (Patient not taking: Reported on 11/19/2024), Disp: 14 tablet, Rfl: 0   tiZANidine (ZANAFLEX) 2 MG tablet, Take 2 mg by mouth 3 (three) times daily., Disp: , Rfl:    VITAMIN D  PO, Take by mouth., Disp: , Rfl:   Current Facility-Administered Medications:    0.9 %  sodium chloride  infusion, 500 mL, Intravenous, Continuous, Nandigam, Kavitha V, MD   Medications ordered in this encounter:  Meds ordered this encounter  Medications   oseltamivir  (TAMIFLU )  75 MG capsule    Sig: Take 1 capsule (75 mg total) by mouth 2 (two) times daily.    Dispense:  10 capsule    Refill:  0     *If you need refills on other medications prior to your next appointment, please contact your pharmacy*  Follow-Up: Call back or seek an in-person evaluation if the symptoms worsen or if the condition fails to improve as anticipated.  [facility] [phone]  Other Instructions  Continue to use over the counter cold/flu medicines to help manage your symptoms and use tylenol  as directed on the package for pain or fever. Drink plenty of fluids to stay hydrated    If you have been instructed to have an in-person evaluation today at a local Urgent Care facility, please use the link below. It will take you to a list of all of our available [facility], including address, phone number and hours of operation. Please do not delay care.  [facility]  If you or a family member do not have a primary care provider, use the link below to schedule a visit and establish care. When you choose a [facility] primary care physician or advanced practice provider, you gain a long-term partner in health. Find a Primary Care Provider  Learn more about [facility] in-office and virtual care options: [facility] - Get Care Now  "

## 2024-12-30 ENCOUNTER — Other Ambulatory Visit: Payer: Self-pay

## 2024-12-30 DIAGNOSIS — F339 Major depressive disorder, recurrent, unspecified: Secondary | ICD-10-CM

## 2025-01-20 ENCOUNTER — Encounter

## 2025-11-04 ENCOUNTER — Telehealth: Admitting: Neurology
